# Patient Record
Sex: Female | Born: 1995 | Race: White | Hispanic: No | Marital: Married | Smoking: Never smoker
Health system: Southern US, Community
[De-identification: ages and names within clinical notes are randomized; demographics above are authoritative.]

## PROBLEM LIST (undated history)

## (undated) DIAGNOSIS — C801 Malignant (primary) neoplasm, unspecified: Secondary | ICD-10-CM

## (undated) DIAGNOSIS — M8580 Other specified disorders of bone density and structure, unspecified site: Secondary | ICD-10-CM

## (undated) HISTORY — DX: Other specified disorders of bone density and structure, unspecified site: M85.80

---

## 2020-09-03 ENCOUNTER — Other Ambulatory Visit: Payer: Self-pay | Admitting: Radiology

## 2020-09-03 DIAGNOSIS — N6320 Unspecified lump in the left breast, unspecified quadrant: Secondary | ICD-10-CM

## 2020-09-14 ENCOUNTER — Other Ambulatory Visit: Payer: Self-pay | Admitting: Radiology

## 2020-09-14 ENCOUNTER — Ambulatory Visit
Admission: RE | Admit: 2020-09-14 | Discharge: 2020-09-14 | Disposition: A | Discharge status: Home / Self Care | Payer: No Typology Code available for payment source | Source: Ambulatory Visit | Attending: Radiology | Admitting: Radiology

## 2020-09-14 ENCOUNTER — Other Ambulatory Visit: Payer: Self-pay

## 2020-09-14 DIAGNOSIS — N6320 Unspecified lump in the left breast, unspecified quadrant: Secondary | ICD-10-CM

## 2020-09-18 ENCOUNTER — Other Ambulatory Visit: Payer: Self-pay

## 2020-09-18 ENCOUNTER — Ambulatory Visit
Admission: RE | Admit: 2020-09-18 | Discharge: 2020-09-18 | Disposition: A | Discharge status: Home / Self Care | Payer: No Typology Code available for payment source | Source: Ambulatory Visit | Attending: Radiology | Admitting: Radiology

## 2020-09-18 DIAGNOSIS — N6320 Unspecified lump in the left breast, unspecified quadrant: Secondary | ICD-10-CM

## 2020-09-22 ENCOUNTER — Other Ambulatory Visit: Payer: No Typology Code available for payment source

## 2020-09-24 ENCOUNTER — Other Ambulatory Visit: Payer: Self-pay | Admitting: Hematology and Oncology

## 2020-09-24 DIAGNOSIS — C50911 Malignant neoplasm of unspecified site of right female breast: Secondary | ICD-10-CM

## 2020-09-24 NOTE — Progress Notes (Signed)
[facility] CONSULT NOTE  Patient Care Team: Chrzanowski, Annitta Needs, NP as PCP - General (Nurse Practitioner)  CHIEF COMPLAINTS/PURPOSE OF CONSULTATION:  Newly diagnosed left breast cancer  HISTORY OF PRESENTING ILLNESS:  Allison Dodson 25 y.o. female is here because of recent diagnosis of invasive ductal carcinoma of the left breast. She palpated a mass in the left breast, and she has extensive family history of breast cancer with numerous cousins on her mother's side, maternal grandmother (mid 18's) and her maternal great aunt, a maternal first cousin (late 1's), and four daughters of her maternal great aunt were all diagnosed with breast cancer. Diagnostic mammogram and [place] on 09/14/2020 showed a suspicious mass in the 3 o'clock location, measuring 3 centimeters and a possible 5 millimeter satellite nodule is identified anterior and inferior to the larger mass. Biopsy on 09/18/2020 showed invasive ductal carcinoma. She presents to the clinic today for initial evaluation and discussion of treatment options.   I reviewed her records extensively and collaborated the history with the patient.  SUMMARY OF ONCOLOGIC HISTORY: Oncology History  Malignant neoplasm of upper-outer quadrant of left breast in female, estrogen receptor positive ([place])  09/25/2020 Initial Diagnosis   Palpable lump in the left breast.  Mammogram revealed 3 cm tumor and a possible 5 mm satellite nodule.  Biopsy revealed grade 3 IDC ER 40% weak, PR 0%, HER2 negative, Ki-67 60%   09/25/2020 Cancer Staging   Staging form: Breast, AJCC 8th Edition - Clinical stage from 09/25/2020: Stage IIB (cT2, cN0, cM0, G3, ER+, PR-, HER2-) - Signed by Nicholas Lose, MD on 09/25/2020 Stage prefix: Initial diagnosis Histologic grading system: 3 grade system     MEDICAL HISTORY:  No previous medical problems  SURGICAL HISTORY: No prior surgeries  SOCIAL HISTORY: Denies any tobacco or alcohol or recreational drug use FAMILY HISTORY:  Extensive family history of breast cancer No family history on file.  ALLERGIES:  has no allergies on file.  MEDICATIONS:  Current Outpatient Medications  Medication Sig Dispense Refill   tamoxifen (NOLVADEX) 20 MG tablet Take 1 tablet (20 mg total) by mouth daily. 30 tablet 1   No current facility-administered medications for this visit.    REVIEW OF SYSTEMS:   Constitutional: Denies fevers, chills or abnormal night sweats Eyes: Denies blurriness of vision, double vision or watery eyes Ears, nose, mouth, throat, and face: Denies mucositis or sore throat Respiratory: Denies cough, dyspnea or wheezes Cardiovascular: Denies palpitation, chest discomfort or lower extremity swelling Gastrointestinal:  Denies nausea, heartburn or change in bowel habits Skin: Denies abnormal skin rashes Lymphatics: Denies new lymphadenopathy or easy bruising Neurological:Denies numbness, tingling or new weaknesses Behavioral/Psych: Mood is stable, no new changes  Breast: Palpable lump in the breast All other systems were reviewed with the patient and are negative.  PHYSICAL EXAMINATION: ECOG PERFORMANCE STATUS: 0 - Asymptomatic  Vitals:   09/25/20 1200  BP: 120/77  Pulse: (!) 101  Resp: 18  Temp: 97.8 F (36.6 C)  SpO2: 100%   Filed Weights   09/25/20 1200  Weight: 222 lb 12.8 oz (101.1 kg)   RADIOGRAPHIC STUDIES: I have personally reviewed the radiological reports and agreed with the findings in the report.  ASSESSMENT AND PLAN:  Malignant neoplasm of upper-outer quadrant of left breast in female, estrogen receptor positive ([place]) 09/22/2020: Palpable mass in the left breast corresponding to suspicious mass on mammogram measuring 3 cm, possible 5 mm satellite nodule, left axilla negative, right breast negative ER 40% weak, PR negative,  HER2 negative, Ki-67 60%  Pathology and radiology counseling:Discussed with the patient, the details of pathology including the type of breast cancer,the  clinical staging, the significance of ER, PR and HER-2/neu receptors and the implications for treatment. After reviewing the pathology in detail, we proceeded to discuss the different treatment options between surgery, radiation, chemotherapy, antiestrogen therapies.  Recommendations: 1. Breast conserving surgery followed by 2. adjuvant chemotherapy with dose dense Adriamycin and Cytoxan followed by Taxol weekly x12 3. Adjuvant radiation therapy followed by 4. Adjuvant antiestrogen therapy with ovarian function suppression and aromatase inhibitor therapy  Chemotherapy Counseling: I discussed the risks and benefits of chemotherapy including the risks of nausea/ vomiting, risk of infection from low WBC count, fatigue due to chemo or anemia, bruising or bleeding due to low platelets, mouth sores, loss/ change in taste and decreased appetite. Liver and kidney function will be monitored through out chemotherapy as abnormalities in liver and kidney function may be a side effect of treatment. Cardiac dysfunction due to Adriamycin Herceptin and Perjeta were discussed in detail. Risk of permanent bone marrow dysfunction and leukemia due to chemo were also discussed.  Logistics: Patient's wedding is on 10/22/2020.  She will be back in town 11/02/2020. Subsequently she will undergo surgery. I started her on tamoxifen today at 10 mg daily and she will stop 4 days before her wedding. We discussed the risks and benefits of tamoxifen and the risk of hot flashes mood swings, muscle cramps and risk of blood clots as well. Instructed her that she cannot get pregnant while on tamoxifen.  Plan: 1.  Genetic testing 2. breast MRI Return to clinic after surgery to discuss final pathology report and come up with the adjuvant chemotherapy plan. I went over the risks and benefits of chemotherapy with her.     All questions were answered. The patient knows to call the clinic with any problems, questions or concerns.    Rulon Eisenmenger, MD, MPH 09/25/2020    I, Thana Ates, am acting as scribe for Nicholas Lose, MD.  I have reviewed the above documentation for accuracy and completeness, and I agree with the above.

## 2020-09-25 ENCOUNTER — Inpatient Hospital Stay: Payer: No Typology Code available for payment source | Attending: Hematology and Oncology

## 2020-09-25 ENCOUNTER — Other Ambulatory Visit: Payer: Self-pay

## 2020-09-25 ENCOUNTER — Other Ambulatory Visit: Payer: Self-pay | Admitting: Genetic Counselor

## 2020-09-25 ENCOUNTER — Inpatient Hospital Stay (HOSPITAL_BASED_OUTPATIENT_CLINIC_OR_DEPARTMENT_OTHER): Payer: No Typology Code available for payment source | Admitting: Hematology and Oncology

## 2020-09-25 ENCOUNTER — Encounter: Payer: Self-pay | Admitting: Genetic Counselor

## 2020-09-25 ENCOUNTER — Inpatient Hospital Stay (HOSPITAL_BASED_OUTPATIENT_CLINIC_OR_DEPARTMENT_OTHER): Payer: No Typology Code available for payment source | Admitting: Genetic Counselor

## 2020-09-25 DIAGNOSIS — Z170 Estrogen receptor positive status [ER+]: Secondary | ICD-10-CM

## 2020-09-25 DIAGNOSIS — C50412 Malignant neoplasm of upper-outer quadrant of left female breast: Secondary | ICD-10-CM | POA: Insufficient documentation

## 2020-09-25 DIAGNOSIS — C50912 Malignant neoplasm of unspecified site of left female breast: Secondary | ICD-10-CM

## 2020-09-25 DIAGNOSIS — Z803 Family history of malignant neoplasm of breast: Secondary | ICD-10-CM | POA: Insufficient documentation

## 2020-09-25 HISTORY — DX: Family history of malignant neoplasm of breast: Z80.3

## 2020-09-25 HISTORY — DX: Malignant neoplasm of unspecified site of left female breast: C50.912

## 2020-09-25 LAB — GENETIC SCREENING ORDER

## 2020-09-25 MED ORDER — TAMOXIFEN CITRATE 20 MG PO TABS: 20.0000 mg | ORAL_TABLET | Freq: Every day | ORAL | 1 refills | Status: DC

## 2020-09-25 NOTE — Progress Notes (Signed)
REFERRING PROVIDER: Nicholas Lose, MD [address] [place],  [place] [address]  PRIMARY PROVIDER:  Kerry Dory, NP  PRIMARY REASON FOR VISIT:  1. Malignant neoplasm of upper-outer quadrant of left breast in female, estrogen receptor positive ([place])   2. Family history of breast cancer      HISTORY OF PRESENT ILLNESS:   [name], a 25 y.o. female, was seen for a  cancer genetics consultation at the request of Dr. Lindi Adie due to a personal history of cancer.  [name] presents to clinic today to discuss the possibility of a hereditary predisposition to cancer, to discuss genetic testing, and to further clarify her future cancer risks, as well as potential cancer risks for family members.   In September 2022, at the age of 20, Allison Dodson was diagnosed with invasive ductal carcinoma of the left breast.  The preliminary treatment plan is pending.    CANCER HISTORY:  Oncology History  Malignant neoplasm of upper-outer quadrant of left breast in female, estrogen receptor positive ([place])  09/25/2020 Initial Diagnosis   Palpable lump in the left breast.  Mammogram revealed 3 cm tumor and a possible 5 mm satellite nodule.  Biopsy revealed grade 3 IDC ER 40% weak, PR 0%, HER2 negative, Ki-67 60%   09/25/2020 Cancer Staging   Staging form: Breast, AJCC 8th Edition - Clinical stage from 09/25/2020: Stage IIB (cT2, cN0, cM0, G3, ER+, PR-, HER2-) - Signed by Nicholas Lose, MD on 09/25/2020 Stage prefix: Initial diagnosis Histologic grading system: 3 grade system      RISK FACTORS:  Menarche was at age 23.  Nulliparous.  OCP use for approximately 8 years.  Ovaries intact: yes.  Hysterectomy: no.  Menopausal status: premenopausal.  HRT use: 0 years. Colonoscopy: yes; not examined. Mammogram within the last year: yes. Number of breast biopsies: 1. Up to date with pelvic exams: most recent PAP at 45. Any excessive radiation exposure in the past: no  Past Medical  History:  Diagnosis Date   Family history of breast cancer 09/25/2020     Social History   Socioeconomic History   Marital status: Married    Spouse name: Not on file   Number of children: Not on file   Years of education: Not on file   Highest education level: Not on file  Occupational History   Not on file  Tobacco Use   Smoking status: Not on file   Smokeless tobacco: Not on file  Substance and Sexual Activity   Alcohol use: Not on file   Drug use: Not on file   Sexual activity: Not on file  Other Topics Concern   Not on file  Social History Narrative   Not on file   Social Determinants of Health   Financial Resource Strain: Not on file  Food Insecurity: Not on file  Transportation Needs: Not on file  Physical Activity: Not on file  Stress: Not on file  Social Connections: Not on file     FAMILY HISTORY:  We obtained a detailed, 4-generation family history.  Significant diagnoses are listed below: Family History  Problem Relation Age of Onset   Liver cancer Maternal Aunt 55   Breast cancer Maternal Aunt 59   Breast cancer Maternal Grandmother 71   Lung cancer Paternal Grandfather 74       smoking hx   Breast cancer Other        MGM's sister & nieces   Breast cancer Cousin 27  triple negative    Allison Dodson is unaware of previous family history of genetic testing for hereditary cancer risks besides that mentioned above. Patient's ancestors are of Irish/Native American descent. There is no reported Ashkenazi Jewish ancestry. There is no known consanguinity.  GENETIC COUNSELING ASSESSMENT: [name] is a 25 y.o. female with a personal and family history of cancer which is somewhat suggestive of a hereditary cancer syndrome and predisposition to cancer given her age of diagnosis and the presence of related cancers in her maternal family. We, therefore, discussed and recommended the following at today's visit.   DISCUSSION: We discussed that 5 - 10% of cancer is  hereditary, with most cases of hereditary breast cancer associated with mutations in BRCA1/2.  There are other genes that can be associated with hereditary breast cancer syndromes.  Type of cancer risk and level of risk are gene-specific. We discussed that testing is beneficial for several reasons including knowing how to follow individuals after completing their treatment, identifying whether potential treatment options would be beneficial, and understanding if other family members could be at risk for cancer and allowing them to undergo genetic testing.   We reviewed the characteristics, features and inheritance patterns of hereditary cancer syndromes. We also discussed genetic testing, including the appropriate family members to test, the process of testing, insurance coverage and turn-around-time for results. We discussed the implications of a negative, positive and/or variant of uncertain significant result. In order to get genetic test results in a timely manner so that Allison Dodson can use these genetic test results for surgical decisions, we recommended Allison Dodson pursue genetic testing for the [facility] BRCAPlus Panel.  The BRCAplus panel offered by [employer] and includes sequencing and deletion/duplication analysis for the following 8 genes: ATM, BRCA1, BRCA2, CDH1, CHEK2, PALB2, PTEN, and TP53.  Once complete, we recommend Allison Dodson pursue reflex genetic testing to a more comprehensive gene panel.   [name]  was offered a common hereditary cancer panel (47 genes) and an expanded pan-cancer panel (77 genes). [name] was informed of the benefits and limitations of each panel, including that expanded pan-cancer panels contain genes that do not have clear management guidelines at this point in time.  We also discussed that as the number of genes included on a panel increases, the chances of variants of uncertain significance increases.  After considering the benefits and limitations of each gene panel, [name]  elected to have an expanded pan-cancer panel through [place].  The CancerNext-Expanded gene panel offered by [facility] and includes sequencing, rearrangement, and RNA analysis for the following 77 genes: AIP, ALK, APC, ATM, AXIN2, BAP1, BARD1, BLM, BMPR1A, BRCA1, BRCA2, BRIP1, CDC73, CDH1, CDK4, CDKN1B, CDKN2A, CHEK2, CTNNA1, DICER1, FANCC, FH, FLCN, GALNT12, KIF1B, LZTR1, MAX, MEN1, MET, MLH1, MSH2, MSH3, MSH6, MUTYH, NBN, NF1, NF2, NTHL1, PALB2, PHOX2B, PMS2, POT1, PRKAR1A, PTCH1, PTEN, RAD51C, RAD51D, RB1, RECQL, RET, SDHA, SDHAF2, SDHB, SDHC, SDHD, SMAD4, SMARCA4, SMARCB1, SMARCE1, STK11, SUFU, TMEM127, TP53, TSC1, TSC2, VHL and XRCC2 (sequencing and deletion/duplication); EGFR, EGLN1, HOXB13, KIT, MITF, PDGFRA, POLD1, and POLE (sequencing only); EPCAM and GREM1 (deletion/duplication only).   Based on Allison Dodson's personal history of cancer, she meets medical criteria for genetic testing. Despite that she meets criteria, she may still have an out of pocket cost. We discussed that if her out of pocket cost for testing is over $100, the laboratory should contact her to discuss self-pay prices, patient pay assistance programs, if applicable, and other billing options.  PLAN: After considering the risks, benefits, and limitations, Allison Dodson provided informed consent to pursue genetic testing and the blood sample was sent to [facility] for analysis of the CancerNext-Expanded . Results should be available within approximately 1-2 weeks' time, at which point they will be disclosed by telephone to Allison Dodson, as will any additional recommendations warranted by these results. [name] will receive a summary of her genetic counseling visit and a copy of her results once available. This information will also be available in Epic.   Based on Allison Dodson's family history, we recommended her maternal cousin, who was diagnosed with triple negative breast cancer at age 16, have genetic counseling and testing.  [name] will let us know if we can be of any assistance in coordinating genetic counseling and/or testing for this family member.   Lastly, we encouraged Allison Dodson to remain in contact with cancer genetics annually so that we can continuously update the family history and inform her of any changes in cancer genetics and testing that may be of benefit for this family.   Ms. Guido Sander questions were answered to her satisfaction today. Our contact information was provided should additional questions or concerns arise. Thank you for the referral and allowing [place] to share in the care of your patient.   Hiram Mciver M. Joette Catching, [place], [facility] Genetic Counselor [email or web address] (P) [phone]  The patient was seen for a total of 40 minutes in face-to-face genetic counseling.  The patient was accompanied by her fiance, Allison Dodson. Drs. Magrinat, Lindi Adie and/or Burr Medico were available to discuss this case as needed.  _______________________________________________________________________ For Office Staff:  Number of people involved in session: 2 Was an Intern/ student involved with case: no

## 2020-09-25 NOTE — Assessment & Plan Note (Addendum)
09/22/2020: Palpable mass in the left breast corresponding to suspicious mass on mammogram measuring 3 cm, possible 5 mm satellite nodule, left axilla negative, right breast negative ER 40% weak, PR negative, HER2 negative, Ki-67 60%  Pathology and radiology counseling:Discussed with the patient, the details of pathology including the type of breast cancer,the clinical staging, the significance of ER, PR and HER-2/neu receptors and the implications for treatment. After reviewing the pathology in detail, we proceeded to discuss the different treatment options between surgery, radiation, chemotherapy, antiestrogen therapies.  Recommendations: 1. Breast conserving surgery followed by 2. adjuvant chemotherapy with dose dense Adriamycin and Cytoxan followed by Taxol weekly x12 3. Adjuvant radiation therapy followed by 4. Adjuvant antiestrogen therapy with ovarian function suppression and aromatase inhibitor therapy  Chemotherapy Counseling: I discussed the risks and benefits of chemotherapy including the risks of nausea/ vomiting, risk of infection from low WBC count, fatigue due to chemo or anemia, bruising or bleeding due to low platelets, mouth sores, loss/ change in taste and decreased appetite. Liver and kidney function will be monitored through out chemotherapy as abnormalities in liver and kidney function may be a side effect of treatment. Cardiac dysfunction due to Adriamycin Herceptin and Perjeta were discussed in detail. Risk of permanent bone marrow dysfunction and leukemia due to chemo were also discussed.  Logistics: Patient's wedding is on 10/22/2020.  She will be back in town 11/02/2020. Subsequently she will undergo surgery. I started her on tamoxifen today at 10 mg daily and she will stop 4 days before her wedding. We discussed the risks and benefits of tamoxifen and the risk of hot flashes mood swings, muscle cramps and risk of blood clots as well. Instructed her that she cannot get  pregnant while on tamoxifen.  Plan: 1.  Genetic testing 2. breast MRI Return to clinic after surgery to discuss final pathology report and come up with the adjuvant chemotherapy plan. I went over the risks and benefits of chemotherapy with her.

## 2020-09-27 ENCOUNTER — Other Ambulatory Visit: Payer: Self-pay

## 2020-09-27 ENCOUNTER — Other Ambulatory Visit: Payer: Self-pay | Admitting: Hematology and Oncology

## 2020-09-27 ENCOUNTER — Ambulatory Visit
Admission: RE | Admit: 2020-09-27 | Discharge: 2020-09-27 | Disposition: A | Discharge status: Home / Self Care | Payer: No Typology Code available for payment source | Source: Ambulatory Visit | Attending: Hematology and Oncology | Admitting: Hematology and Oncology

## 2020-09-27 DIAGNOSIS — C50912 Malignant neoplasm of unspecified site of left female breast: Secondary | ICD-10-CM

## 2020-09-27 DIAGNOSIS — C50911 Malignant neoplasm of unspecified site of right female breast: Secondary | ICD-10-CM

## 2020-09-27 MED ORDER — GADOBUTROL 1 MMOL/ML IV SOLN
10.0000 mL | Freq: Once | INTRAVENOUS | Status: AC | PRN
  Administered 2020-09-27: 10 mL via INTRAVENOUS

## 2020-09-28 ENCOUNTER — Telehealth: Payer: Self-pay | Admitting: *Deleted

## 2020-09-28 ENCOUNTER — Encounter: Payer: Self-pay | Admitting: *Deleted

## 2020-09-28 ENCOUNTER — Telehealth: Payer: Self-pay | Admitting: Hematology and Oncology

## 2020-09-28 ENCOUNTER — Other Ambulatory Visit: Payer: Self-pay | Admitting: Hematology and Oncology

## 2020-09-28 DIAGNOSIS — C50412 Malignant neoplasm of upper-outer quadrant of left female breast: Secondary | ICD-10-CM

## 2020-09-28 DIAGNOSIS — Z170 Estrogen receptor positive status [ER+]: Secondary | ICD-10-CM

## 2020-09-28 MED ORDER — DEXAMETHASONE 4 MG PO TABS: 4.0000 mg | ORAL_TABLET | Freq: Every day | ORAL | 1 refills | Status: DC

## 2020-09-28 MED ORDER — ONDANSETRON HCL 8 MG PO TABS: 8.0000 mg | ORAL_TABLET | Freq: Two times a day (BID) | ORAL | 1 refills | Status: DC | PRN

## 2020-09-28 MED ORDER — LORAZEPAM 0.5 MG PO TABS: 0.5000 mg | ORAL_TABLET | Freq: Every evening | ORAL | 0 refills | Status: DC | PRN

## 2020-09-28 MED ORDER — LIDOCAINE-PRILOCAINE 2.5-2.5 % EX CREA: TOPICAL_CREAM | CUTANEOUS | 3 refills | Status: DC

## 2020-09-28 MED ORDER — PROCHLORPERAZINE MALEATE 10 MG PO TABS: 10.0000 mg | ORAL_TABLET | Freq: Four times a day (QID) | ORAL | 1 refills | Status: DC | PRN

## 2020-09-28 NOTE — Progress Notes (Signed)
I reviewed the breast MRI with the patient. The tumor measures 3.7 cm and there is a 7 mm additional satellite nodule. There is no lymphadenopathy. Based on these findings I recommended starting systemic chemotherapy after her wedding and honeymoon.  This will start on the week of October 21.

## 2020-09-28 NOTE — Telephone Encounter (Signed)
Spoke with patient and she will do neoadjuvant chemo after she returns from her wedding/honeymoon.  Confirmed appts for chemo education 9/27 at 8am followed by echo at 10 at [facility].  I have reached out the research nurses to discuss nausea study with her on 9/27.

## 2020-09-28 NOTE — Telephone Encounter (Signed)
Spoke with patient to follow up from new patient appt. And assess navigation needs.  Contact information.  I informed her I had spoken with [facility] about an appointment.  She informed me that they had contacted her this am with information and she is not sure yet as to what she wants to do regarding fertility preservation.  Reviewed plan and there seems to be some confusion regarding the sequence of treatment plan. Dr. Lindi Adie will be contacting her to verify treatment plan.  Encouraged her to call with any questions or concerns. Patient verbalized understanding.

## 2020-09-28 NOTE — Progress Notes (Signed)
START ON PATHWAY REGIMEN - Breast     Cycles 1 through 4: A cycle is every 14 days:     Doxorubicin      Cyclophosphamide      [id number]    Cycles 5 through 16: A cycle is every 7 days:     Paclitaxel   **Always confirm dose/schedule in your pharmacy ordering system**  Patient Characteristics: Preoperative or Nonsurgical Candidate (Clinical Staging), Neoadjuvant Therapy followed by Surgery, Invasive Disease, Chemotherapy, HER2 Negative/Unknown/Equivocal, ER Positive Therapeutic Status: Preoperative or Nonsurgical Candidate (Clinical Staging) AJCC M Category: cM0 AJCC Grade: G3 Breast Surgical Plan: Neoadjuvant Therapy followed by Surgery ER Status: Positive (+) AJCC 8 Stage Grouping: IIB HER2 Status: Negative (-) AJCC T Category: cT2 AJCC N Category: cN0 PR Status: Negative (-) Intent of Therapy: Curative Intent, Discussed with Patient

## 2020-09-28 NOTE — Telephone Encounter (Signed)
Spoke to patient to confirm upcoming ECHO appointment gave time and location, patient voiced understanding

## 2020-10-01 ENCOUNTER — Other Ambulatory Visit: Payer: Self-pay | Admitting: *Deleted

## 2020-10-01 DIAGNOSIS — C50412 Malignant neoplasm of upper-outer quadrant of left female breast: Secondary | ICD-10-CM

## 2020-10-01 DIAGNOSIS — Z170 Estrogen receptor positive status [ER+]: Secondary | ICD-10-CM

## 2020-10-02 ENCOUNTER — Encounter: Payer: Self-pay | Admitting: *Deleted

## 2020-10-02 ENCOUNTER — Telehealth: Payer: Self-pay | Admitting: Hematology and Oncology

## 2020-10-02 NOTE — Telephone Encounter (Signed)
Sch per 9/16 basket , pt aware

## 2020-10-05 ENCOUNTER — Telehealth: Payer: Self-pay | Admitting: Genetic Counselor

## 2020-10-05 ENCOUNTER — Encounter: Payer: Self-pay | Admitting: *Deleted

## 2020-10-05 ENCOUNTER — Encounter: Payer: Self-pay | Admitting: Genetic Counselor

## 2020-10-05 DIAGNOSIS — Z1379 Encounter for other screening for genetic and chromosomal anomalies: Secondary | ICD-10-CM | POA: Insufficient documentation

## 2020-10-05 NOTE — Telephone Encounter (Signed)
Revealed negative genetic testing.  Discussed that we do not know why she has breast cancer or why there is cancer in the family. It could be familial, due to a different gene that we are not testing, or maybe our current technology may not be able to pick something up.  It will be important for her to keep in contact with genetics to keep up with whether additional testing may be needed.  Results of pan-cancer panel pending.

## 2020-10-09 NOTE — Patient Instructions (Signed)
DUE TO COVID-19 ONLY ONE VISITOR IS ALLOWED TO COME WITH YOU AND STAY IN THE WAITING ROOM ONLY DURING PRE OP AND PROCEDURE.   **NO VISITORS ARE ALLOWED IN THE SHORT STAY AREA OR RECOVERY ROOM!!**  IF YOU WILL BE ADMITTED INTO THE HOSPITAL YOU ARE ALLOWED ONLY TWO SUPPORT PEOPLE DURING VISITATION HOURS ONLY (10AM -8PM)   The support person(s) may change daily. The support person(s) must pass our screening, gel in and out, and wear a mask at all times, including in the patient's room. Patients must also wear a mask when staff or their support person are in the room.  No visitors under the age of 41. Any visitor under the age of 41 must be accompanied by an adult.     Your procedure is scheduled on: 11/02/20   Report to [facility] Main Entrance   Report to Short Stay at 5:15 AM   [facility])   Call this number if you have problems the morning of surgery [phone]   Do not eat food :After Midnight.   May have liquids until : 4:30 AM   day of surgery  CLEAR LIQUID DIET  Foods Allowed                                                                     Foods Excluded  Water, Black Coffee and tea, regular and decaf                             liquids that you cannot  Plain Jell-O in any flavor  (No red)                                           see through such as: Fruit ices (not with fruit pulp)                                     milk, soups, orange juice              Iced Popsicles (No red)                                    All solid food                                   Apple juices Sports drinks like Gatorade (No red) Lightly seasoned clear broth or consume(fat free) Sugar,   Sample Menu Breakfast                                Lunch                                     Supper Cranberry juice  Beef broth                            Chicken broth Jell-O                                     Grape juice                           Apple juice Coffee  or tea                        Jell-O                                      Popsicle                                                Coffee or tea                        Coffee or te       Oral Hygiene is also important to reduce your risk of infection.                                    Remember - BRUSH YOUR TEETH THE MORNING OF SURGERY WITH YOUR REGULAR TOOTHPASTE   Do NOT smoke after Midnight   Take these medicines the morning of surgery with A SIP OF WATER:   DO NOT TAKE ANY ORAL DIABETIC MEDICATIONS DAY OF YOUR SURGERY                              You may not have any metal on your body including hair pins, jewelry, and body piercing             Do not wear make-up, lotions, powders, perfumes/cologne, or deodorant  Do not wear nail polish including gel and S&S, artificial/acrylic nails, or any other type of covering on natural nails including finger and toenails. If you have artificial nails, gel coating, etc. that needs to be removed by a nail salon please have this removed prior to surgery or surgery may need to be canceled/ delayed if the surgeon/ anesthesia feels like they are unable to be safely monitored.   Do not shave  48 hours prior to surgery.    Do not bring valuables to the hospital. [place].   Contacts, dentures or bridgework may not be worn into surgery.   Bring small overnight bag day of surgery.    Patients discharged on the day of surgery will not be allowed to drive home.   Special Instructions: Bring a copy of your healthcare power of attorney and living will documents         the day of surgery if you haven't scanned them before.  Please read over the following fact sheets you were given: IF YOU HAVE QUESTIONS ABOUT YOUR PRE-OP INSTRUCTIONS PLEASE CALL [phone]   [facility] - Preparing for Surgery Before surgery, you can play an important role.  Because skin is not sterile, your skin needs to  be as free of germs as possible.  You can reduce the number of germs on your skin by washing with CHG (chlorahexidine gluconate) soap before surgery.  CHG is an antiseptic cleaner which kills germs and bonds with the skin to continue killing germs even after washing. Please DO NOT use if you have an allergy to CHG or antibacterial soaps.  If your skin becomes reddened/irritated stop using the CHG and inform your nurse when you arrive at Short Stay. Do not shave (including legs and underarms) for at least 48 hours prior to the first CHG shower.  You may shave your face/neck. Please follow these instructions carefully:  1.  Shower with CHG Soap the night before surgery and the  morning of Surgery.  2.  If you choose to wash your hair, wash your hair first as usual with your  normal  shampoo.  3.  After you shampoo, rinse your hair and body thoroughly to remove the  shampoo.                           4.  Use CHG as you would any other liquid soap.  You can apply chg directly  to the skin and wash                       Gently with a scrungie or clean washcloth.  5.  Apply the CHG Soap to your body ONLY FROM THE NECK DOWN.   Do not use on face/ open                           Wound or open sores. Avoid contact with eyes, ears mouth and genitals (private parts).                       Wash face,  Genitals (private parts) with your normal soap.             6.  Wash thoroughly, paying special attention to the area where your surgery  will be performed.  7.  Thoroughly rinse your body with warm water from the neck down.  8.  DO NOT shower/wash with your normal soap after using and rinsing off  the CHG Soap.                9.  Pat yourself dry with a clean towel.            10.  Wear clean pajamas.            11.  Place clean sheets on your bed the night of your first shower and do not  sleep with pets. Day of Surgery : Do not apply any lotions/deodorants the morning of surgery.  Please wear clean clothes to  the hospital/surgery center.  FAILURE TO FOLLOW THESE INSTRUCTIONS MAY RESULT IN THE CANCELLATION OF YOUR SURGERY PATIENT SIGNATURE_________________________________  NURSE SIGNATURE__________________________________  ________________________________________________________________________

## 2020-10-09 NOTE — Progress Notes (Signed)
Pt. Needs orders for surgery.PAT and labs on 10/13/20.

## 2020-10-12 ENCOUNTER — Other Ambulatory Visit: Payer: Self-pay | Admitting: General Surgery

## 2020-10-12 ENCOUNTER — Other Ambulatory Visit (HOSPITAL_COMMUNITY): Payer: No Typology Code available for payment source

## 2020-10-13 ENCOUNTER — Encounter (HOSPITAL_COMMUNITY): Payer: Self-pay

## 2020-10-13 ENCOUNTER — Encounter (HOSPITAL_COMMUNITY)
Admission: RE | Admit: 2020-10-13 | Discharge: 2020-10-13 | Disposition: A | Discharge status: Home / Self Care | Payer: No Typology Code available for payment source | Source: Ambulatory Visit | Attending: Hematology and Oncology | Admitting: Hematology and Oncology

## 2020-10-13 ENCOUNTER — Ambulatory Visit (HOSPITAL_COMMUNITY)
Admission: RE | Admit: 2020-10-13 | Discharge: 2020-10-13 | Disposition: A | Discharge status: Home / Self Care | Payer: No Typology Code available for payment source | Source: Ambulatory Visit | Attending: Hematology and Oncology | Admitting: Hematology and Oncology

## 2020-10-13 ENCOUNTER — Inpatient Hospital Stay: Payer: No Typology Code available for payment source

## 2020-10-13 ENCOUNTER — Telehealth: Payer: Self-pay | Admitting: Genetic Counselor

## 2020-10-13 ENCOUNTER — Other Ambulatory Visit: Payer: Self-pay

## 2020-10-13 ENCOUNTER — Ambulatory Visit: Payer: Self-pay | Admitting: Genetic Counselor

## 2020-10-13 DIAGNOSIS — C50412 Malignant neoplasm of upper-outer quadrant of left female breast: Secondary | ICD-10-CM | POA: Insufficient documentation

## 2020-10-13 DIAGNOSIS — Z01818 Encounter for other preprocedural examination: Secondary | ICD-10-CM | POA: Insufficient documentation

## 2020-10-13 DIAGNOSIS — Z1379 Encounter for other screening for genetic and chromosomal anomalies: Secondary | ICD-10-CM

## 2020-10-13 DIAGNOSIS — Z170 Estrogen receptor positive status [ER+]: Secondary | ICD-10-CM

## 2020-10-13 DIAGNOSIS — Z0189 Encounter for other specified special examinations: Secondary | ICD-10-CM

## 2020-10-13 DIAGNOSIS — Z803 Family history of malignant neoplasm of breast: Secondary | ICD-10-CM

## 2020-10-13 HISTORY — DX: Malignant (primary) neoplasm, unspecified: C80.1

## 2020-10-13 LAB — CBC
HCT: 37.9 % (ref 36.0–46.0)
Hemoglobin: 12.6 g/dL (ref 12.0–15.0)
MCH: 28.5 pg (ref 26.0–34.0)
MCHC: 33.2 g/dL (ref 30.0–36.0)
MCV: 85.7 fL (ref 80.0–100.0)
Platelets: 279 10*3/uL (ref 150–400)
RBC: 4.42 MIL/uL (ref 3.87–5.11)
RDW: 12.9 % (ref 11.5–15.5)
WBC: 8.6 10*3/uL (ref 4.0–10.5)
nRBC: 0 % (ref 0.0–0.2)

## 2020-10-13 LAB — ECHOCARDIOGRAM COMPLETE
Area-P 1/2: 5.66 cm2
S' Lateral: 3 cm

## 2020-10-13 NOTE — Progress Notes (Signed)
  Echocardiogram 2D Echocardiogram has been performed.  Darlina Sicilian M 10/13/2020, 10:50 AM

## 2020-10-13 NOTE — Progress Notes (Signed)
HPI:  Allison Dodson was previously seen in the [facility] clinic due to a personal and family history of cancer and concerns regarding a hereditary predisposition to cancer. Please refer to our prior cancer genetics clinic note for more information regarding our discussion, assessment and recommendations, at the time. Ms. Guido Sander recent genetic test results were disclosed to her, as were recommendations warranted by these results. These results and recommendations are discussed in more detail below.  CANCER HISTORY:  Oncology History  Malignant neoplasm of upper-outer quadrant of left breast in female, estrogen receptor positive ([place])  09/25/2020 Initial Diagnosis   Palpable lump in the left breast.  Mammogram revealed 3 cm tumor and a possible 5 mm satellite nodule.  Biopsy revealed grade 3 IDC ER 40% weak, PR 0%, HER2 negative, Ki-67 60%   09/25/2020 Cancer Staging   Staging form: Breast, AJCC 8th Edition - Clinical stage from 09/25/2020: Stage IIB (cT2, cN0, cM0, G3, ER+, PR-, HER2-) - Signed by Nicholas Lose, MD on 09/25/2020 Stage prefix: Initial diagnosis Histologic grading system: 3 grade system   10/02/2020 Genetic Testing   Negative hereditary cancer genetic testing: no pathogenic variants detected in [facility] BRCAPlus Panel and [facility] CancerNext-Expanded +RNAinsight Panel.  The report dates are October 02, 2020 and October 13, 2020, respectively.   The BRCAplus panel offered by [employer] and includes sequencing and deletion/duplication analysis for the following 8 genes: ATM, BRCA1, BRCA2, CDH1, CHEK2, PALB2, PTEN, and TP53.  The CancerNext-Expanded gene panel offered by [facility] and includes sequencing, rearrangement, and RNA analysis for the following 77 genes: AIP, ALK, APC, ATM, AXIN2, BAP1, BARD1, BLM, BMPR1A, BRCA1, BRCA2, BRIP1, CDC73, CDH1, CDK4, CDKN1B, CDKN2A, CHEK2, CTNNA1, DICER1, FANCC, FH, FLCN, GALNT12, KIF1B, LZTR1, MAX, MEN1, MET, MLH1, MSH2, MSH3, MSH6,  MUTYH, NBN, NF1, NF2, NTHL1, PALB2, PHOX2B, PMS2, POT1, PRKAR1A, PTCH1, PTEN, RAD51C, RAD51D, RB1, RECQL, RET, SDHA, SDHAF2, SDHB, SDHC, SDHD, SMAD4, SMARCA4, SMARCB1, SMARCE1, STK11, SUFU, TMEM127, TP53, TSC1, TSC2, VHL and XRCC2 (sequencing and deletion/duplication); EGFR, EGLN1, HOXB13, KIT, MITF, PDGFRA, POLD1, and POLE (sequencing only); EPCAM and GREM1 (deletion/duplication only).    11/10/2020 -  Chemotherapy    Patient is on Treatment Plan: BREAST ADJUVANT DOSE DENSE AC Q14D / PACLITAXEL Q7D         FAMILY HISTORY:  We obtained a detailed, 4-generation family history.  Significant diagnoses are listed below: Family History  Problem Relation Age of Onset   Liver cancer Maternal Aunt 55   Breast cancer Maternal Aunt 59   Breast cancer Maternal Grandmother 41   Lung cancer Paternal Grandfather 55       smoking hx   Breast cancer Other        MGM's sister & nieces   Breast cancer Cousin 51       triple negative    [name] is unaware of previous family history of genetic testing for hereditary cancer risks besides that mentioned above. Patient's ancestors are of Irish/Native American descent. There is no reported Ashkenazi Jewish ancestry. There is no known consanguinity.    GENETIC TEST RESULTS: Genetic testing reported out on October 13, 2020. The CancerNext-Expanded +RNAinsight Panel through [facility] found no pathogenic mutations. The CancerNext-Expanded gene panel offered by [facility] and includes sequencing, rearrangement, and RNA analysis for the following 77 genes: AIP, ALK, APC, ATM, AXIN2, BAP1, BARD1, BLM, BMPR1A, BRCA1, BRCA2, BRIP1, CDC73, CDH1, CDK4, CDKN1B, CDKN2A, CHEK2, CTNNA1, DICER1, FANCC, FH, FLCN, GALNT12, KIF1B, LZTR1, MAX, MEN1, MET, MLH1, MSH2,  MSH3, MSH6, MUTYH, NBN, NF1, NF2, NTHL1, PALB2, PHOX2B, PMS2, POT1, PRKAR1A, PTCH1, PTEN, RAD51C, RAD51D, RB1, RECQL, RET, SDHA, SDHAF2, SDHB, SDHC, SDHD, SMAD4, SMARCA4, SMARCB1, SMARCE1, STK11, SUFU,  TMEM127, TP53, TSC1, TSC2, VHL and XRCC2 (sequencing and deletion/duplication); EGFR, EGLN1, HOXB13, KIT, MITF, PDGFRA, POLD1, and POLE (sequencing only); EPCAM and GREM1 (deletion/duplication only).   The test report has been scanned into EPIC and is located under the Molecular Pathology section of the Results Review tab.  A portion of the result report is included below for reference.     We discussed with [name] that because current genetic testing is not perfect, it is possible there may be a gene mutation in one of these genes that current testing cannot detect, but that chance is small.  We also discussed, that there could be another gene that has not yet been discovered, or that we have not yet tested, that is responsible for the cancer diagnoses in the family. It is also possible there is a hereditary cause for the cancer in the family that [name] did not inherit and therefore was not identified in her testing.  Therefore, it is important to remain in touch with cancer genetics in the future so that we can continue to offer [name] the most up to date genetic testing.   ADDITIONAL GENETIC TESTING:  We discussed with [name] that her genetic testing was fairly extensive.  If there are genes identified to increase cancer risk that can be analyzed in the future, we would be happy to discuss and coordinate this testing at that time.    CANCER SCREENING RECOMMENDATIONS: Ms. Guido Sander test result is considered negative (normal).  This means that we have not identified a hereditary cause for her personal history of cancer at this time.   Given [name]'s personal and family histories, we must interpret these negative results with some caution.  Families with features suggestive of hereditary risk for cancer tend to have multiple family members with cancer, diagnoses in multiple generations and diagnoses before the age of 23. Ms. Guido Sander family exhibits some of these features. Thus, this result may  simply reflect our current inability to detect all mutations within these genes or there may be a different gene that has not yet been discovered or tested.   An individual's cancer risk and medical management are not determined by genetic test results alone. Overall cancer risk assessment incorporates additional factors, including personal medical history, family history, and any available genetic information that may result in a personalized plan for cancer prevention and surveillance  RECOMMENDATIONS FOR FAMILY MEMBERS:  Individuals in this family might be at some increased risk of developing cancer, over the general population risk, simply due to the family history of cancer.  We recommended women in this family have a yearly mammogram beginning at age 23, or 19 years younger than the earliest onset of cancer, an annual clinical breast exam, and perform monthly breast self-exams. Women in this family should also have a gynecological exam as recommended by their primary provider. Family members should be referred for colonoscopy starting at age 60, or earlier, as recommended by their providers.    It is also possible there is a hereditary cause for the cancer in [name]'s family that she did not inherit and therefore was not identified in her.  Based on [name]'s family history, we recommended her maternal cousin, who was diagnosed with triple negative breast cancer at age 32, have genetic counseling  and testing. Allison Dodson can let us know if we can be of any assistance in coordinating genetic counseling and/or testing for this family member.   FOLLOW-UP: Lastly, we discussed with [name] that cancer genetics is a rapidly advancing field and it is possible that new genetic tests will be appropriate for her and/or her family members in the future. We encouraged her to remain in contact with cancer genetics on an annual basis so we can update her personal and family histories and let her know of advances  in cancer genetics that may benefit this family.   Our contact number was provided. Ms. Guido Sander questions were answered to her satisfaction, and she knows she is welcome to call us at anytime with additional questions or concerns.     Nikesh Teschner M. Joette Catching, [place], [facility], [facility] Genetic Counselor [email or web address] (P) [phone]

## 2020-10-13 NOTE — Telephone Encounter (Addendum)
Revealed negative genetic testing for pan-cancer panel.  Discussed that we do not know why she has breast cancer or why there is cancer in the family. It could be familial, due to a different gene that we are not testing, or maybe our current technology may not be able to pick something up.  It will be important for her to keep in contact with genetics to keep up with whether additional testing may be needed.

## 2020-10-13 NOTE — Progress Notes (Signed)
COVID Vaccine Completed: NO Date COVID Vaccine completed: COVID vaccine manufacturer: [employer] Test: N/A  PCP - Rubbie Battiest: NP Cardiologist -   Chest x-ray -  EKG -  Stress Test -  ECHO -  Cardiac Cath -  Pacemaker/ICD device last checked:  Sleep Study -  CPAP -   Fasting Blood Sugar -  Checks Blood Sugar _____ times a day  Blood Thinner Instructions: Aspirin Instructions: Last Dose:  Anesthesia review:   Patient denies shortness of breath, fever, cough and chest pain at PAT appointment   Patient verbalized understanding of instructions that were given to them at the PAT appointment. Patient was also instructed that they will need to review over the PAT instructions again at home before surgery.

## 2020-10-16 ENCOUNTER — Telehealth: Payer: Self-pay | Admitting: *Deleted

## 2020-10-16 NOTE — Telephone Encounter (Signed)
[id number] - TREATMENT OF REFRACTORY NAUSEA  Introduced study to patient and presented the timeline for completing consent and baseline activities if patient is interested. Patient stated she was interested in the study but unfortunately is too busy before she starts treatment to participate. Patient is going out of town next week for her wedding and then will be on her honeymoon. She returns the day before her PAC placement which is in the morning the day before she starts treatment.   Thanked patient for her time and discussing this study with research nurse.  Presented the DCP-001 data collection study briefly informing patient it could be done while she is in infusion room getting treatment. Patient agreed to discuss possible participation in the DCP-001 study during her treatment.  Thanked patient again and encouraged her to call if any questions prior to her next visit.  Dr. Lindi Adie notified patient declined the [facility] study due to not enough time.  Foye Spurling, BSN, RN Clinical Research Nurse 10/16/2020 1:34 PM

## 2020-10-21 ENCOUNTER — Other Ambulatory Visit: Payer: Self-pay

## 2020-10-21 ENCOUNTER — Ambulatory Visit: Payer: No Typology Code available for payment source | Attending: Hematology and Oncology | Admitting: Physical Therapy

## 2020-10-21 ENCOUNTER — Encounter: Payer: Self-pay | Admitting: Physical Therapy

## 2020-10-21 DIAGNOSIS — Z170 Estrogen receptor positive status [ER+]: Secondary | ICD-10-CM | POA: Insufficient documentation

## 2020-10-21 DIAGNOSIS — R293 Abnormal posture: Secondary | ICD-10-CM | POA: Diagnosis not present

## 2020-10-21 DIAGNOSIS — C50412 Malignant neoplasm of upper-outer quadrant of left female breast: Secondary | ICD-10-CM | POA: Insufficient documentation

## 2020-10-21 NOTE — Therapy (Signed)
[facility] @ [name], [place], [address] Phone:     Fax:     Physical Therapy Evaluation  Patient Details  Name: Allison Dodson MRN: [id number] Date of Birth: 1995/02/07 Referring Provider (PT): Lindi Adie   Encounter Date: 10/21/2020   PT End of Session - 10/21/20 1458     Visit Number 1    Number of Visits 2    Date for PT Re-Evaluation 04/21/21    PT Start Time 1409    PT Stop Time 1455    PT Time Calculation (min) 46 min    Activity Tolerance Patient tolerated treatment well    Behavior During Therapy [facility] for tasks assessed/performed             Past Medical History:  Diagnosis Date   Cancer [facility])    Family history of breast cancer 09/25/2020    History reviewed. No pertinent surgical history.  There were no vitals filed for this visit.    Subjective Assessment - 10/21/20 1410     Subjective I have like 20 weeks of chemo that I am going to do before surgery. I am going to opt for a double mastectomy.    Patient is accompained by: Family member    Pertinent History L breast cancer ER+ PR- Her2-; will complete neoadjuvant chemo then pt plans on having a double mastectomy,    Patient Stated Goals to gain info from provider    Currently in Pain? No/denies                [facility] PT Assessment - 10/21/20 0001       Assessment   Medical Diagnosis L breast cancer    Referring Provider (PT) Gudena    Onset Date/Surgical Date 09/22/20    Hand Dominance Right    Prior Therapy none      Precautions   Precautions Other (comment)    Precaution Comments active cancer      Restrictions   Weight Bearing Restrictions No      Balance Screen   Has the patient fallen in the past 6 months No    Has the patient had a decrease in activity level because of a fear of falling?  No    Is the patient reluctant to leave their home because of a fear of falling?  No      Home Manufacturing systems engineer residence    Living Arrangements Spouse/significant other    Available Help at Discharge Family    Type of [place]      Prior Function   Level of Independence Independent    Vocation Full time employment    Vocation Requirements works from home for [employer]    Leisure pt reports she walks sporadically      Cognition   Overall Cognitive Status Within Functional Limits for tasks assessed      Posture/Postural Control   Posture/Postural Control Postural limitations    Postural Limitations Rounded Shoulders;Forward head      ROM / Strength   AROM / PROM / Strength AROM      AROM   AROM Assessment Site Shoulder    Right/Left Shoulder Right;Left    Right Shoulder Extension 60 Degrees    Right Shoulder Flexion 168 Degrees    Right Shoulder ABduction 170 Degrees    Right Shoulder Internal Rotation 70 Degrees    Right Shoulder External Rotation 98 Degrees  Left Shoulder Extension 65 Degrees    Left Shoulder Flexion 165 Degrees    Left Shoulder ABduction 85 Degrees    Left Shoulder Internal Rotation 80 Degrees    Left Shoulder External Rotation 96 Degrees               LYMPHEDEMA/ONCOLOGY QUESTIONNAIRE - 10/21/20 0001       Type   Cancer Type left breast cancer      Lymphedema Assessments   Lymphedema Assessments Upper extremities      Right Upper Extremity Lymphedema   15 cm Proximal to Olecranon Process 35.5 cm    Olecranon Process 28.5 cm    15 cm Proximal to Ulnar Styloid Process 29 cm    Just Proximal to Ulnar Styloid Process 17.1 cm    Across Hand at [employer] 19 cm    At [place] of 2nd Digit 5.9 cm      Left Upper Extremity Lymphedema   15 cm Proximal to Olecranon Process 35.9 cm    Olecranon Process 28.2 cm    15 cm Proximal to Ulnar Styloid Process 28 cm    Just Proximal to Ulnar Styloid Process 16.5 cm    Across Hand at [employer] 18.8 cm    At [place] of 2nd Digit 6 cm             L-DEX FLOWSHEETS -  10/21/20 1400       L-DEX LYMPHEDEMA SCREENING   Measurement Type Unilateral    L-DEX MEASUREMENT EXTREMITY Upper Extremity    POSITION  Standing    DOMINANT SIDE Right    At Risk Side Left    BASELINE SCORE (UNILATERAL) -3.2             The patient was assessed using the L-Dex machine today to produce a lymphedema index baseline score. The patient will be reassessed on a regular basis (typically every 3 months) to obtain new L-Dex scores. If the score is > 6.5 points away from his/her baseline score indicating onset of subclinical lymphedema, it will be recommended to wear a compression garment for 4 weeks, 12 hours per day and then be reassessed. If the score continues to be > 6.5 points from baseline at reassessment, we will initiate lymphedema treatment. Assessing in this manner has a 95% rate of preventing clinically significant lymphedema.        Objective measurements completed on examination: See above findings.                PT Education - 10/21/20 1500     Education Details ABC class, post op shoulder ROM exercises, anatomy and physiology of lymphatic system, signs and symptoms of lymphedema, SOZO, lymphedema risk reduction practices    Person(s) Educated Patient    Methods Explanation;Handout    Comprehension Verbalized understanding                 PT Long Term Goals - 10/21/20 1459       PT LONG TERM GOAL #1   Title Pt will not demonstrate any signs or symptoms of lymphedema and will return to baseline shoulder ROM measurements    Time 6    Period Months    Status New    Target Date 04/21/21             Breast Clinic Goals - 10/21/20 1604       Patient will be able to verbalize understanding of pertinent lymphedema risk reduction practices relevant  to her diagnosis specifically related to skin care.   Time 1    Period Days    Status Achieved      Patient will be able to return demonstrate and/or verbalize understanding of the  post-op home exercise program related to regaining shoulder range of motion.   Time 1    Period Days    Status Achieved      Patient will be able to verbalize understanding of the importance of attending the postoperative After Breast Cancer Class for further lymphedema risk reduction education and therapeutic exercise.   Time 1    Period Days    Status Achieved                   Plan - 10/21/20 1600     Clinical Impression Statement Pt presents to PT with recently diagnosed L breast cancer - ER+, PR-, HER2-. She will be completing neoadjuvant chemotherapy prior to undergoing surgery. Pt plans to undergo a bilateral mastectomy after she completes her chemo around March 2022. Pt was instructed in post op exercises and educated that if she has a mastectomy then not to begin exercises until a week after the last drain is removed. Baseline ROM and SOZO measurements taken today. She will benefit from a post op PT reassessment to determine needs and in every 3 months for additional L dex screening to detect subclinical lymphedema.    Stability/Clinical Decision Making Stable/Uncomplicated    Clinical Decision Making Low    Rehab Potential Good    PT Frequency --   eval and 1 f/u visit   PT Duration --   6 months   PT Treatment/Interventions ADLs/Self Care Home Management;Therapeutic exercise;Patient/family education    PT Next Visit Plan reassess baselines, sign pt up for ABC class    PT Home Exercise Plan post op breast exercises    Consulted and Agree with Plan of Care Patient             Patient will benefit from skilled therapeutic intervention in order to improve the following deficits and impairments:  Postural dysfunction, Decreased knowledge of precautions  Visit Diagnosis: Abnormal posture  Malignant neoplasm of upper-outer quadrant of left breast in female, estrogen receptor positive ([place])   Patient will follow up at outpatient cancer rehab 3-4 weeks following  surgery.  If the patient requires physical therapy at that time, a specific plan will be dictated and sent to the referring physician for approval. The patient was educated today on appropriate basic range of motion exercises to begin post operatively and the importance of attending the After Breast Cancer class following surgery.  Patient was educated today on lymphedema risk reduction practices as it pertains to recommendations that will benefit the patient immediately following surgery.  She verbalized good understanding.     Problem List Patient Active Problem List   Diagnosis Date Noted   Genetic testing 10/05/2020   Malignant neoplasm of upper-outer quadrant of left breast in female, estrogen receptor positive ([place]) 09/25/2020   Family history of breast cancer 09/25/2020    Manus Gunning, PT 10/21/2020, 4:04 PM  [facility] @ [place], [place], [address] Phone:     Fax:     Name: Latorsha Curling MRN: [id number] Date of Birth: Mar 04, 1995  Manus Gunning, PT 10/21/20 4:04 PM

## 2020-10-27 NOTE — Progress Notes (Signed)
Pharmacist Chemotherapy Monitoring - Initial Assessment    Anticipated start date: 11/03/20   The following has been reviewed per standard work regarding the patient's treatment regimen: The patient's diagnosis, treatment plan and drug doses, and organ/hematologic function Lab orders and baseline tests specific to treatment regimen  The treatment plan start date, drug sequencing, and pre-medications Prior authorization status  Patient's documented medication list, including drug-drug interaction screen and prescriptions for anti-emetics and supportive care specific to the treatment regimen The drug concentrations, fluid compatibility, administration routes, and timing of the medications to be used The patient's access for treatment and lifetime cumulative dose history, if applicable  The patient's medication allergies and previous infusion related reactions, if applicable   Changes made to treatment plan:  Urine pregnancy test with each chemo cycle, orders added  Follow up needed:  Pending authorization for treatment  F/U CMET   Acquanetta Belling, RPH, BCPS, BCOP 10/27/2020  2:15 PM

## 2020-10-31 ENCOUNTER — Encounter (HOSPITAL_COMMUNITY): Payer: Self-pay | Admitting: General Surgery

## 2020-10-31 NOTE — Anesthesia Preprocedure Evaluation (Addendum)
Anesthesia Evaluation  Patient identified by MRN, date of birth, ID band Patient awake    Reviewed: Allergy & Precautions, NPO status , Patient's Chart, lab work & pertinent test results  Airway Mallampati: II  TM Distance: >3 FB Neck ROM: Full    Dental no notable dental hx. (+) Dental Advisory Given, Teeth Intact   Pulmonary neg pulmonary ROS,    Pulmonary exam normal breath sounds clear to auscultation       Cardiovascular negative cardio ROS Normal cardiovascular exam Rhythm:Regular Rate:Normal     Neuro/Psych negative neurological ROS  negative psych ROS   GI/Hepatic negative GI ROS, Neg liver ROS,   Endo/Other  Left Breast Ca  Renal/GU negative Renal ROS  negative genitourinary   Musculoskeletal negative musculoskeletal ROS (+)   Abdominal (+) + obese,   Peds  Hematology negative hematology ROS (+)   Anesthesia Other Findings   Reproductive/Obstetrics                            Anesthesia Physical Anesthesia Plan  ASA: 2  Anesthesia Plan: General   Post-op Pain Management:    Induction: Intravenous  PONV Risk Score and Plan: 4 or greater and Scopolamine patch - Pre-op, Treatment may vary due to age or medical condition, Midazolam and Ondansetron  Airway Management Planned: LMA  Additional Equipment:   Intra-op Plan:   Post-operative Plan: Extubation in OR  Informed Consent: I have reviewed the patients History and Physical, chart, labs and discussed the procedure including the risks, benefits and alternatives for the proposed anesthesia with the patient or authorized representative who has indicated his/her understanding and acceptance.     Dental advisory given  Plan Discussed with: CRNA and Anesthesiologist  Anesthesia Plan Comments:        Anesthesia Quick Evaluation

## 2020-11-02 ENCOUNTER — Ambulatory Visit (HOSPITAL_COMMUNITY): Payer: No Typology Code available for payment source | Admitting: Certified Registered Nurse Anesthetist

## 2020-11-02 ENCOUNTER — Ambulatory Visit (HOSPITAL_COMMUNITY): Payer: No Typology Code available for payment source

## 2020-11-02 ENCOUNTER — Ambulatory Visit (HOSPITAL_COMMUNITY)
Admission: RE | Admit: 2020-11-02 | Discharge: 2020-11-02 | Disposition: A | Discharge status: Home / Self Care | Payer: No Typology Code available for payment source | Source: Ambulatory Visit | Attending: General Surgery | Admitting: General Surgery

## 2020-11-02 ENCOUNTER — Encounter (HOSPITAL_COMMUNITY)
Admission: RE | Disposition: A | Discharge status: Home / Self Care | Payer: Self-pay | Source: Ambulatory Visit | Attending: General Surgery

## 2020-11-02 ENCOUNTER — Other Ambulatory Visit: Payer: Self-pay

## 2020-11-02 ENCOUNTER — Encounter (HOSPITAL_COMMUNITY): Payer: Self-pay | Admitting: General Surgery

## 2020-11-02 DIAGNOSIS — Z803 Family history of malignant neoplasm of breast: Secondary | ICD-10-CM | POA: Diagnosis not present

## 2020-11-02 DIAGNOSIS — Z170 Estrogen receptor positive status [ER+]: Secondary | ICD-10-CM | POA: Insufficient documentation

## 2020-11-02 DIAGNOSIS — Z452 Encounter for adjustment and management of vascular access device: Secondary | ICD-10-CM | POA: Diagnosis not present

## 2020-11-02 DIAGNOSIS — C50812 Malignant neoplasm of overlapping sites of left female breast: Secondary | ICD-10-CM | POA: Diagnosis not present

## 2020-11-02 HISTORY — PX: PORTACATH PLACEMENT: SHX2246

## 2020-11-02 LAB — PREGNANCY, URINE: Preg Test, Ur: NEGATIVE

## 2020-11-02 SURGERY — INSERTION, TUNNELED CENTRAL VENOUS DEVICE, WITH PORT
Anesthesia: General | Site: Chest

## 2020-11-02 MED ORDER — LIDOCAINE 2% (20 MG/ML) 5 ML SYRINGE
INTRAMUSCULAR | Status: DC | PRN
  Administered 2020-11-02: 100 mg via INTRAVENOUS

## 2020-11-02 MED ORDER — SCOPOLAMINE 1 MG/3DAYS TD PT72
1.0000 | MEDICATED_PATCH | TRANSDERMAL | Status: DC
  Administered 2020-11-02: 1 via TRANSDERMAL

## 2020-11-02 MED ORDER — FENTANYL CITRATE PF 50 MCG/ML IJ SOSY: 25.0000 ug | PREFILLED_SYRINGE | INTRAMUSCULAR | Status: DC | PRN

## 2020-11-02 MED ORDER — FENTANYL CITRATE (PF) 100 MCG/2ML IJ SOLN
INTRAMUSCULAR | Status: DC | PRN
  Administered 2020-11-02: 50 ug via INTRAVENOUS
  Administered 2020-11-02 (×4): 25 ug via INTRAVENOUS

## 2020-11-02 MED ORDER — ONDANSETRON HCL 4 MG/2ML IJ SOLN: 4.0000 mg | Freq: Once | INTRAMUSCULAR | Status: DC | PRN

## 2020-11-02 MED ORDER — HEPARIN SOD (PORK) LOCK FLUSH 100 UNIT/ML IV SOLN
INTRAVENOUS | Status: DC | PRN
  Administered 2020-11-02: 500 [IU] via INTRAVENOUS

## 2020-11-02 MED ORDER — HEPARIN SOD (PORK) LOCK FLUSH 100 UNIT/ML IV SOLN
INTRAVENOUS | Status: AC
  Filled 2020-11-02: qty 5

## 2020-11-02 MED ORDER — TRAMADOL HCL 50 MG PO TABS: 100.0000 mg | ORAL_TABLET | Freq: Four times a day (QID) | ORAL | 0 refills | Status: DC | PRN

## 2020-11-02 MED ORDER — LIDOCAINE HCL (PF) 2 % IJ SOLN
INTRAMUSCULAR | Status: AC
  Filled 2020-11-02: qty 5

## 2020-11-02 MED ORDER — FENTANYL CITRATE (PF) 100 MCG/2ML IJ SOLN
INTRAMUSCULAR | Status: AC
  Filled 2020-11-02: qty 2

## 2020-11-02 MED ORDER — DEXAMETHASONE SODIUM PHOSPHATE 10 MG/ML IJ SOLN
INTRAMUSCULAR | Status: DC | PRN
  Administered 2020-11-02: 5 mg via INTRAVENOUS

## 2020-11-02 MED ORDER — 0.9 % SODIUM CHLORIDE (POUR BTL) OPTIME
TOPICAL | Status: DC | PRN
  Administered 2020-11-02: 1000 mL

## 2020-11-02 MED ORDER — CHLORHEXIDINE GLUCONATE 0.12 % MT SOLN
15.0000 mL | Freq: Once | OROMUCOSAL | Status: AC
  Administered 2020-11-02: 15 mL via OROMUCOSAL

## 2020-11-02 MED ORDER — MIDAZOLAM HCL 5 MG/5ML IJ SOLN
INTRAMUSCULAR | Status: DC | PRN
  Administered 2020-11-02: 2 mg via INTRAVENOUS

## 2020-11-02 MED ORDER — CEFAZOLIN SODIUM-DEXTROSE 2-4 GM/100ML-% IV SOLN
2.0000 g | INTRAVENOUS | Status: AC
  Administered 2020-11-02: 2 g via INTRAVENOUS
  Filled 2020-11-02: qty 100

## 2020-11-02 MED ORDER — BUPIVACAINE-EPINEPHRINE (PF) 0.25% -1:200000 IJ SOLN
INTRAMUSCULAR | Status: AC
  Filled 2020-11-02: qty 30

## 2020-11-02 MED ORDER — OXYCODONE HCL 5 MG PO TABS: 5.0000 mg | ORAL_TABLET | Freq: Once | ORAL | Status: DC | PRN

## 2020-11-02 MED ORDER — HEPARIN 6000 UNIT IRRIGATION SOLUTION
Freq: Once | Status: AC
  Administered 2020-11-02: 1
  Filled 2020-11-02: qty 6000

## 2020-11-02 MED ORDER — PROPOFOL 10 MG/ML IV BOLUS
INTRAVENOUS | Status: DC | PRN
  Administered 2020-11-02: 200 mg via INTRAVENOUS

## 2020-11-02 MED ORDER — CHLORHEXIDINE GLUCONATE CLOTH 2 % EX PADS: 6.0000 | MEDICATED_PAD | Freq: Once | CUTANEOUS | Status: DC

## 2020-11-02 MED ORDER — BUPIVACAINE-EPINEPHRINE 0.25% -1:200000 IJ SOLN
INTRAMUSCULAR | Status: DC | PRN
  Administered 2020-11-02: 7 mL

## 2020-11-02 MED ORDER — LACTATED RINGERS IV SOLN: INTRAVENOUS | Status: DC

## 2020-11-02 MED ORDER — MIDAZOLAM HCL 2 MG/2ML IJ SOLN
INTRAMUSCULAR | Status: AC
  Filled 2020-11-02: qty 2

## 2020-11-02 MED ORDER — ONDANSETRON HCL 4 MG/2ML IJ SOLN
INTRAMUSCULAR | Status: DC | PRN
Start: 2020-11-02 — End: 2020-11-02
  Administered 2020-11-02: 4 mg via INTRAVENOUS

## 2020-11-02 MED ORDER — OXYCODONE HCL 5 MG/5ML PO SOLN: 5.0000 mg | Freq: Once | ORAL | Status: DC | PRN

## 2020-11-02 MED ORDER — ORAL CARE MOUTH RINSE: 15.0000 mL | Freq: Once | OROMUCOSAL | Status: AC

## 2020-11-02 MED ORDER — SCOPOLAMINE 1 MG/3DAYS TD PT72
MEDICATED_PATCH | TRANSDERMAL | Status: AC
  Filled 2020-11-02: qty 1

## 2020-11-02 MED ORDER — ACETAMINOPHEN 500 MG PO TABS
1000.0000 mg | ORAL_TABLET | ORAL | Status: AC
  Administered 2020-11-02: 1000 mg via ORAL
  Filled 2020-11-02: qty 2

## 2020-11-02 MED ORDER — PROPOFOL 10 MG/ML IV BOLUS
INTRAVENOUS | Status: AC
  Filled 2020-11-02: qty 20

## 2020-11-02 MED ORDER — ENSURE PRE-SURGERY PO LIQD: 296.0000 mL | Freq: Once | ORAL | Status: DC

## 2020-11-02 MED ORDER — DEXAMETHASONE SODIUM PHOSPHATE 10 MG/ML IJ SOLN
INTRAMUSCULAR | Status: AC
  Filled 2020-11-02: qty 1

## 2020-11-02 MED ORDER — ONDANSETRON HCL 4 MG/2ML IJ SOLN
INTRAMUSCULAR | Status: AC
  Filled 2020-11-02: qty 2

## 2020-11-02 MED FILL — Fosaprepitant Dimeglumine For IV Infusion 150 MG (Base Eq): INTRAVENOUS | Qty: 5 | Status: AC

## 2020-11-02 MED FILL — Dexamethasone Sodium Phosphate Inj 100 MG/10ML: INTRAMUSCULAR | Qty: 1 | Status: AC

## 2020-11-02 SURGICAL SUPPLY — 42 items
ADH SKN CLS APL DERMABOND .7 (GAUZE/BANDAGES/DRESSINGS) ×1
APL PRP STRL LF DISP 70% ISPRP (MISCELLANEOUS) ×1
APL SKNCLS STERI-STRIP NONHPOA (GAUZE/BANDAGES/DRESSINGS)
BAG COUNTER SPONGE SURGICOUNT (BAG) IMPLANT
BAG DECANTER FOR FLEXI CONT (MISCELLANEOUS) ×2 IMPLANT
BAG SPNG CNTER NS LX DISP (BAG)
BENZOIN TINCTURE PRP APPL 2/3 (GAUZE/BANDAGES/DRESSINGS) IMPLANT
BLADE SURG 15 STRL LF DISP TIS (BLADE) ×1 IMPLANT
BLADE SURG 15 STRL SS (BLADE) ×2
BLADE SURG SZ11 CARB STEEL (BLADE) ×2 IMPLANT
CHLORAPREP W/TINT 26 (MISCELLANEOUS) ×2 IMPLANT
COVER SURGICAL LIGHT HANDLE (MISCELLANEOUS) ×2 IMPLANT
DECANTER SPIKE VIAL GLASS SM (MISCELLANEOUS) ×2 IMPLANT
DERMABOND ADVANCED (GAUZE/BANDAGES/DRESSINGS) ×1
DERMABOND ADVANCED .7 DNX12 (GAUZE/BANDAGES/DRESSINGS) ×1 IMPLANT
DRAPE C-ARM 42X120 X-RAY (DRAPES) ×2 IMPLANT
DRAPE LAPAROSCOPIC ABDOMINAL (DRAPES) ×2 IMPLANT
DRSG TEGADERM 2-3/8X2-3/4 SM (GAUZE/BANDAGES/DRESSINGS) IMPLANT
DRSG TEGADERM 4X4.75 (GAUZE/BANDAGES/DRESSINGS) ×4 IMPLANT
ELECT REM PT RETURN 15FT ADLT (MISCELLANEOUS) ×2 IMPLANT
GAUZE 4X4 16PLY ~~LOC~~+RFID DBL (SPONGE) ×2 IMPLANT
GAUZE SPONGE 4X4 12PLY STRL (GAUZE/BANDAGES/DRESSINGS) ×2 IMPLANT
GLOVE SURG ENC MOIS LTX SZ7 (GLOVE) ×2 IMPLANT
GLOVE SURG UNDER POLY LF SZ7.5 (GLOVE) ×2 IMPLANT
GOWN STRL REUS W/TWL LRG LVL3 (GOWN DISPOSABLE) ×2 IMPLANT
GOWN STRL REUS W/TWL XL LVL3 (GOWN DISPOSABLE) ×2 IMPLANT
KIT BASIN OR (CUSTOM PROCEDURE TRAY) ×2 IMPLANT
KIT PORT POWER 8FR ISP CVUE (Port) ×2 IMPLANT
KIT TURNOVER KIT A (KITS) ×2 IMPLANT
NEEDLE HYPO 25X1 1.5 SAFETY (NEEDLE) ×2 IMPLANT
PACK BASIC VI WITH GOWN DISP (CUSTOM PROCEDURE TRAY) ×2 IMPLANT
PENCIL SMOKE EVACUATOR (MISCELLANEOUS) IMPLANT
SUT MNCRL AB 4-0 PS2 18 (SUTURE) ×2 IMPLANT
SUT PROLENE 2 0 SH DA (SUTURE) ×2 IMPLANT
SUT SILK 2 0 (SUTURE)
SUT SILK 2-0 30XBRD TIE 12 (SUTURE) IMPLANT
SUT VIC AB 3-0 SH 27 (SUTURE) ×2
SUT VIC AB 3-0 SH 27XBRD (SUTURE) ×1 IMPLANT
SYR 10ML LL (SYRINGE) ×2 IMPLANT
SYR CONTROL 10ML LL (SYRINGE) ×2 IMPLANT
TOWEL OR 17X26 10 PK STRL BLUE (TOWEL DISPOSABLE) ×2 IMPLANT
TOWEL OR NON WOVEN STRL DISP B (DISPOSABLE) ×2 IMPLANT

## 2020-11-02 NOTE — Anesthesia Postprocedure Evaluation (Signed)
Anesthesia Post Note  Patient: Allison Dodson  Procedure(s) Performed: INSERTION PORT-A-CATH (Chest)     Patient location during evaluation: PACU Anesthesia Type: General Level of consciousness: awake and alert and oriented Pain management: pain level controlled Vital Signs Assessment: post-procedure vital signs reviewed and stable Respiratory status: spontaneous breathing, nonlabored ventilation and respiratory function stable Cardiovascular status: blood pressure returned to baseline and stable Postop Assessment: no apparent nausea or vomiting Anesthetic complications: no   No notable events documented.  Last Vitals:  Vitals:   11/02/20 0820 11/02/20 0830  BP:  (!) 153/84  Pulse: 86 77  Resp: (!) 21 13  Temp:    SpO2: 100% 97%    Last Pain:  Vitals:   11/02/20 0830  TempSrc:   PainSc: 0-No pain                 Dawnetta Copenhaver A.

## 2020-11-02 NOTE — Interval H&P Note (Signed)
History and Physical Interval Note:  11/02/2020 6:54 AM  Allison Dodson  has presented today for surgery, with the diagnosis of BREAST CANCER.  The various methods of treatment have been discussed with the patient and family. After consideration of risks, benefits and other options for treatment, the patient has consented to  Procedure(s): INSERTION PORT-A-CATH (N/A) as a surgical intervention.  The patient's history has been reviewed, patient examined, no change in status, stable for surgery.  I have reviewed the patient's chart and labs.  Questions were answered to the patient's satisfaction.     Rolm Bookbinder

## 2020-11-02 NOTE — Transfer of Care (Signed)
Immediate Anesthesia Transfer of Care Note  Patient: Allison Dodson  Procedure(s) Performed: INSERTION PORT-A-CATH (Chest)  Patient Location: PACU  Anesthesia Type:General  Level of Consciousness: awake, drowsy and patient cooperative  Airway & Oxygen Therapy: Patient Spontanous Breathing and Patient connected to face mask oxygen  Post-op Assessment: Report given to RN and Post -op Vital signs reviewed and stable  Post vital signs: Reviewed and stable  Last Vitals:  Vitals Value Taken Time  BP 143/93 11/02/20 0815  Temp 36.4 C 11/02/20 0815  Pulse 101 11/02/20 0815  Resp 21 11/02/20 0815  SpO2 100 % 11/02/20 0815  Vitals shown include unvalidated device data.  Last Pain:  Vitals:   11/02/20 0555  TempSrc: Oral  PainSc:          Complications: No notable events documented.

## 2020-11-02 NOTE — Op Note (Signed)
Preoperative diagnosis: left breast cancer, need for venous access for chemotherapy Postoperative diagnosis: Same as above Procedure: Right internal jugular port placement Surgeon: Dr. Serita Grammes Anesthesia: General Estimated blood loss: Minimal Specimens: None Drains: None Complications: None Special count was correct completion Disposition recovery stable edition  Indications: 86 yof with newly diagnosed left breast cancer that is a grade III IDC that is weak 40% er pos, pr neg, her 2 neg and Ki is 60%. She has been seen in multidisciplinary fashion and we have elected to proceed with primary chemotherapy. I discussed port placement.   Procedure: After informed consent was obtained she was taken to the operating room.   She was given antibiotics.  SCDs were in place.  She was placed under general anesthesia without complication.  She was prepped and draped in a standard sterile surgical fashion.  Surgical timeout was then performed.  I then identified her right internal jugular vein with the ultrasound.  I made a small nick in the skin.  I then accessed this on the first pass with the needle.  The wire was placed.  This was confirmed to be in the vein by the ultrasound as well as by fluoroscopy.  I then placed local into the area below her clavicle where the pocket was made.  I made incision and developed a pocket.  I then tunneled the line between the 2 sites.  The dilator was then placed over the wire.  This was done using fluoroscopy.  The wire was then removed.  The line was placed through the sheath and the sheath removed.  I pulled the line back to be in the distal cava near the cavoatrial junction.  This was a good position upon completion.  I then hooked the port up to the line.  I sutured this position with 2-0 Prolene suture.  I then accessed the port and left accessed for chemotherapy tomorrow.   Aspirated blood and flushed easily.  I placed heparin in the port.  I then got a final  completion film that showed this all to be in good position.  I then closed with 3-0 Vicryl 4-0 Monocryl.  Glue was placed.  She tolerated this well and was transferred to recovery.

## 2020-11-02 NOTE — Discharge Instructions (Signed)
PORT-A-CATH: POST OP INSTRUCTIONS  Always review your discharge instruction sheet given to you by the facility where your surgery was performed.   A prescription for pain medication may be given to you upon discharge. Take your pain medication as prescribed, if needed. If narcotic pain medicine is not needed, then you make take acetaminophen (Tylenol) or ibuprofen (Advil) as needed.  Take your usually prescribed medications unless otherwise directed. If you need a refill on your pain medication, please contact our office. All narcotic pain medicine now requires a paper prescription.  Phoned in and fax refills are no longer allowed by law.  Prescriptions will not be filled after 5 pm or on weekends.  You should follow a light diet for the remainder of the day after your procedure. Most patients will experience some mild swelling and/or bruising in the area of the incision. It may take several days to resolve. It is common to experience some constipation if taking pain medication after surgery. Increasing fluid intake and taking a stool softener (such as Colace) will usually help or prevent this problem from occurring. A mild laxative (Milk of Magnesia or Miralax) should be taken according to package directions if there are no bowel movements after 48 hours.  Unless discharge instructions indicate otherwise, you may remove your bandages 48 hours after surgery, and you may shower at that time. You may have steri-strips (small white skin tapes) in place directly over the incision.  These strips should be left on the skin for 7-10 days.  If your surgeon used Dermabond (skin glue) on the incision, you may shower in 24 hours.  The glue will flake off over the next 2-3 weeks.  If your port is left accessed at the end of surgery (needle left in port), the dressing cannot get wet and should only by changed by a healthcare professional. When the port is no longer accessed (when the needle has been removed), follow  step 7.   ACTIVITIES:  Limit activity involving your arms for the next 72 hours. Do no strenuous exercise or activity for 1 week. You may drive when you are no longer taking prescription pain medication, you can comfortably wear a seatbelt, and you can maneuver your car. 10.You may need to see your doctor in the office for a follow-up appointment.  Please       check with your doctor.  11.When you receive a new Port-a-Cath, you will get a product guide and        ID card.  Please keep them in case you need them.  WHEN TO CALL YOUR DOCTOR ([phone]): Fever over 101.0 Chills Continued bleeding from incision Increased redness and tenderness at the site Shortness of breath, difficulty breathing   The clinic staff is available to answer your questions during regular business hours. Please don't hesitate to call and ask to speak to one of the nurses or medical assistants for clinical concerns. If you have a medical emergency, go to the nearest emergency room or call 911.  A surgeon from [facility] is always on call at the hospital.     For further information, please visit [email or web address]      

## 2020-11-02 NOTE — H&P (Signed)
21 yof with significant family history of breast cancer. She has no dc. She has no prior history. She noted a mass in august. She has c density breasts. US shows a 3 cm mass at 3 oclock in left breast. Ax [place] is negative. Right breast is negative. There is also a poss 5 mm satellite nodule as well. Biopsy is a grade III IDC that is weakly 40% er pos, pr neg, her 2 neg, and Ki is 60% She works from home for [employer] in [place] She does desire children She is due to get married in one month- her fiancee is here with her  Review of Systems: A complete review of systems was obtained from the patient. I have reviewed this information and discussed as appropriate with the patient. See HPI as well for other ROS.  Review of Systems  Psychiatric/Behavioral: The patient is nervous/anxious.  All other systems reviewed and are negative.   Medical History: Past Medical History:  Diagnosis Date   History of cancer   There is no problem list on file for this patient.  History reviewed. No pertinent surgical history.   No Known Allergies  Current Outpatient Medications on File Prior to Visit  Medication Sig Dispense Refill   ALPRAZolam (XANAX) 0.25 MG tablet Take 0.25 mg by mouth at bedtime as needed for Sleep   No current facility-administered medications on file prior to visit.   History reviewed. No pertinent family history.   Social History   Tobacco Use  Smoking Status Never Smoker  Smokeless Tobacco Never Used    Social History   Socioeconomic History   Marital status: Married  Tobacco Use   Smoking status: Never Smoker   Smokeless tobacco: Never Used  Substance and Sexual Activity   Alcohol use: Not Currently   Drug use: Not Currently   Objective:   Body mass index is 38.88 kg/m.  Physical Exam Constitutional:  Appearance: Normal appearance.  Chest:  Breasts:  Right: No mass or nipple discharge.  Left: Mass present. No nipple discharge.   Comments: 3 cm mobile  mass Lymphadenopathy:  Upper Body:  Right upper body: No supraclavicular or axillary adenopathy.  Left upper body: No supraclavicular or axillary adenopathy.  Neurological:  Mental Status: She is alert.    Assessment and Plan:   Carcinoma of upper-outer quadrant of left breast in female, estrogen receptor positive (CMS-HCC) Port placement  We discussed the staging and pathophysiology of breast cancer. We discussed all of the different options for treatment for breast cancer including surgery, chemotherapy, radiation therapy, Herceptin, and antiestrogen therapy. We discussed a sentinel lymph node biopsy as she does not appear to having lymph node involvement right now. We discussed the performance of that with injection of radioactive tracer. We discussed that there is a chance of having a positive node with a sentinel lymph node biopsy and we will await the permanent pathology to make any other first further decisions in terms of her treatment. We discussed up to a 5% risk lifetime of chronic shoulder pain as well as lymphedema associated with a sentinel lymph node biopsy. We discussed the options for treatment of the breast cancer which included lumpectomy versus a mastectomy. We discussed the performance of the lumpectomy with radioactive seed placement. We discussed a 5-10% chance of a positive margin requiring reexcision in the operating room. We also discussed that she will likely need radiation therapy if she undergoes lumpectomy. We discussed mastectomy and the postoperative care for that as well. Mastectomy  can be followed by reconstruction. The decision for lumpectomy vs mastectomy has no impact on decision for chemotherapy. Most mastectomy patients will not need radiation therapy. We discussed that there is no difference in her survival whether she undergoes lumpectomy with radiation therapy or antiestrogen therapy versus a mastectomy. There is also no real difference between her recurrence  in the breast. We are going to obtain genetics and an MRI. We discussed the possibility of primary chemotherapy first as well as referral to a fertility clinic. I will await her oncology visit and then we will come up with a plan after that

## 2020-11-02 NOTE — Anesthesia Procedure Notes (Signed)
Procedure Name: LMA Insertion Date/Time: 11/02/2020 7:22 AM Performed by: West Pugh, CRNA Pre-anesthesia Checklist: Patient identified, Emergency Drugs available, Suction available, Patient being monitored and Timeout performed Patient Re-evaluated:Patient Re-evaluated prior to induction Oxygen Delivery Method: Circle system utilized Preoxygenation: Pre-oxygenation with 100% oxygen Induction Type: IV induction LMA: LMA with gastric port inserted Number of attempts: 1 Placement Confirmation: positive ETCO2 Tube secured with: Tape Dental Injury: Teeth and Oropharynx as per pre-operative assessment

## 2020-11-02 NOTE — Progress Notes (Signed)
Patient Care Team: Kerry Dory, NP as PCP - General (Nurse Practitioner) Rockwell Germany, RN as Oncology Nurse Navigator Mauro Kaufmann, RN as Oncology Nurse Navigator Nicholas Lose, MD as Consulting Physician (Hematology and Oncology) Rolm Bookbinder, MD as Consulting Physician (General Surgery)  DIAGNOSIS:    ICD-10-CM   1. Malignant neoplasm of upper-outer quadrant of left breast in female, estrogen receptor positive ([place])  C50.412    Z17.0       SUMMARY OF ONCOLOGIC HISTORY: Oncology History  Malignant neoplasm of upper-outer quadrant of left breast in female, estrogen receptor positive ([place])  09/25/2020 Initial Diagnosis   Palpable lump in the left breast.  Mammogram revealed 3 cm tumor and a possible 5 mm satellite nodule.  Biopsy revealed grade 3 IDC ER 40% weak, PR 0%, HER2 negative, Ki-67 60%   09/25/2020 Cancer Staging   Staging form: Breast, AJCC 8th Edition - Clinical stage from 09/25/2020: Stage IIB (cT2, cN0, cM0, G3, ER+, PR-, HER2-) - Signed by Nicholas Lose, MD on 09/25/2020 Stage prefix: Initial diagnosis Histologic grading system: 3 grade system   10/02/2020 Genetic Testing   Negative hereditary cancer genetic testing: no pathogenic variants detected in [facility] BRCAPlus Panel and [facility] CancerNext-Expanded +RNAinsight Panel.  The report dates are October 02, 2020 and October 13, 2020, respectively.   The BRCAplus panel offered by [employer] and includes sequencing and deletion/duplication analysis for the following 8 genes: ATM, BRCA1, BRCA2, CDH1, CHEK2, PALB2, PTEN, and TP53.  The CancerNext-Expanded gene panel offered by [facility] and includes sequencing, rearrangement, and RNA analysis for the following 77 genes: AIP, ALK, APC, ATM, AXIN2, BAP1, BARD1, BLM, BMPR1A, BRCA1, BRCA2, BRIP1, CDC73, CDH1, CDK4, CDKN1B, CDKN2A, CHEK2, CTNNA1, DICER1, FANCC, FH, FLCN, GALNT12, KIF1B, LZTR1, MAX, MEN1, MET, MLH1, MSH2, MSH3, MSH6, MUTYH, NBN, NF1, NF2,  NTHL1, PALB2, PHOX2B, PMS2, POT1, PRKAR1A, PTCH1, PTEN, RAD51C, RAD51D, RB1, RECQL, RET, SDHA, SDHAF2, SDHB, SDHC, SDHD, SMAD4, SMARCA4, SMARCB1, SMARCE1, STK11, SUFU, TMEM127, TP53, TSC1, TSC2, VHL and XRCC2 (sequencing and deletion/duplication); EGFR, EGLN1, HOXB13, KIT, MITF, PDGFRA, POLD1, and POLE (sequencing only); EPCAM and GREM1 (deletion/duplication only).    11/03/2020 -  Chemotherapy   Patient is on Treatment Plan : BREAST ADJUVANT DOSE DENSE AC q14d / PACLitaxel q7d       CHIEF COMPLIANT: Cycle 1 AC  INTERVAL HISTORY: Allison Dodson is a 25 y.o. with above-mentioned history of left breast cancer, currently undergoing dose dense Adriamycin and Cytoxan followed by Taxol. She presents to the clinic today for cycle 1.  Her wedding went very well and she is here today to start her chemotherapy.  ALLERGIES:  has No Known Allergies.  MEDICATIONS:  Current Outpatient Medications  Medication Sig Dispense Refill   dexamethasone (DECADRON) 4 MG tablet Take 1 tablet (4 mg total) by mouth daily. Take 1 tab day after chemo and 1 tab 2 days after chemo with food. 8 tablet 1   lidocaine-prilocaine (EMLA) cream Apply to affected area once 30 g 3   LORazepam (ATIVAN) 0.5 MG tablet Take 1 tablet (0.5 mg total) by mouth at bedtime as needed (Nausea or vomiting). 30 tablet 0   ondansetron (ZOFRAN) 8 MG tablet Take 1 tablet (8 mg total) by mouth 2 (two) times daily as needed. Start on the third day after chemotherapy. 30 tablet 1   prochlorperazine (COMPAZINE) 10 MG tablet Take 1 tablet (10 mg total) by mouth every 6 (six) hours as needed (Nausea or vomiting). 30 tablet 1   traMADol (  ULTRAM) 50 MG tablet Take 2 tablets (100 mg total) by mouth every 6 (six) hours as needed. 8 tablet 0   No current facility-administered medications for this visit.    PHYSICAL EXAMINATION: ECOG PERFORMANCE STATUS: 1 - Symptomatic but completely ambulatory  Vitals:   11/03/20 0915  BP: 129/71  Pulse: 92  Resp: 18   Temp: (!) 97.3 F (36.3 C)  SpO2: 100%   Filed Weights   11/03/20 0915  Weight: 220 lb 12.8 oz (100.2 kg)     LABORATORY DATA:  I have reviewed the data as listed No flowsheet data found.  Lab Results  Component Value Date   WBC 8.9 11/03/2020   HGB 11.6 (L) 11/03/2020   HCT 34.5 (L) 11/03/2020   MCV 84.4 11/03/2020   PLT 263 11/03/2020   NEUTROABS 5.2 11/03/2020    ASSESSMENT & PLAN:  Malignant neoplasm of upper-outer quadrant of left breast in female, estrogen receptor positive ([place]) 09/22/2020: Palpable mass in the left breast corresponding to suspicious mass on mammogram measuring 3 cm, possible 5 mm satellite nodule, left axilla negative, right breast negative ER 40% weak, PR negative, HER2 negative, Ki-67 60%  Breast MRI 09/28/2020: 3.7 cm enhancing mass at 3 o'clock position left breast with a possible 7 mm satellite nodule, no abnormal lymph nodes.  Treatment plan: 1.  Neoadjuvant chemotherapy with dose dense Adriamycin and Cytoxan followed by Taxol weekly x12 2. followed by mastectomy (patient's preference) 2. adjuvant radiation therapy (she will not need radiation if she has a mastectomy) 3.  Followed by adjuvant antiestrogen therapy with ovarian function suppression and aromatase inhibitor therapy Patient had her wedding on 10/22/2020 ---------------------------------------------------------------------------------------------------------------------------------------- Current treatment: Cycle 1 day 1 dose dense Adriamycin and Cytoxan Echocardiogram 10/13/2020: EF 60 to 65% Labs reviewed, chemo education completed, chemo consent obtained, antiemetics were reviewed Return to clinic in 1 week for toxicity check    No orders of the defined types were placed in this encounter.  The patient has a good understanding of the overall plan. she agrees with it. she will call with any problems that may develop before the next visit here.  Total time spent: 30 mins  including face to face time and time spent for planning, charting and coordination of care  Rulon Eisenmenger, MD, MPH 11/03/2020  I, Thana Ates, am acting as scribe for Dr. Nicholas Lose.  I have reviewed the above documentation for accuracy and completeness, and I agree with the above.

## 2020-11-03 ENCOUNTER — Encounter (HOSPITAL_COMMUNITY): Payer: Self-pay | Admitting: General Surgery

## 2020-11-03 ENCOUNTER — Inpatient Hospital Stay: Payer: No Typology Code available for payment source | Attending: Hematology and Oncology

## 2020-11-03 ENCOUNTER — Encounter: Payer: Self-pay | Admitting: *Deleted

## 2020-11-03 ENCOUNTER — Inpatient Hospital Stay (HOSPITAL_BASED_OUTPATIENT_CLINIC_OR_DEPARTMENT_OTHER): Payer: No Typology Code available for payment source | Admitting: Hematology and Oncology

## 2020-11-03 ENCOUNTER — Inpatient Hospital Stay: Payer: No Typology Code available for payment source

## 2020-11-03 DIAGNOSIS — Z5111 Encounter for antineoplastic chemotherapy: Secondary | ICD-10-CM | POA: Insufficient documentation

## 2020-11-03 DIAGNOSIS — K5900 Constipation, unspecified: Secondary | ICD-10-CM | POA: Insufficient documentation

## 2020-11-03 DIAGNOSIS — C50412 Malignant neoplasm of upper-outer quadrant of left female breast: Secondary | ICD-10-CM | POA: Diagnosis present

## 2020-11-03 DIAGNOSIS — Z95828 Presence of other vascular implants and grafts: Secondary | ICD-10-CM | POA: Insufficient documentation

## 2020-11-03 DIAGNOSIS — Z170 Estrogen receptor positive status [ER+]: Secondary | ICD-10-CM | POA: Diagnosis not present

## 2020-11-03 DIAGNOSIS — Z79899 Other long term (current) drug therapy: Secondary | ICD-10-CM | POA: Diagnosis not present

## 2020-11-03 LAB — CMP (CANCER CENTER ONLY)
ALT: 13 U/L (ref 0–44)
AST: 11 U/L — ABNORMAL LOW (ref 15–41)
Albumin: 3.6 g/dL (ref 3.5–5.0)
Alkaline Phosphatase: 43 U/L (ref 38–126)
Anion gap: 9 (ref 5–15)
BUN: 10 mg/dL (ref 6–20)
CO2: 24 mmol/L (ref 22–32)
Calcium: 9.1 mg/dL (ref 8.9–10.3)
Chloride: 107 mmol/L (ref 98–111)
Creatinine: 0.65 mg/dL (ref 0.44–1.00)
GFR, Estimated: >60 mL/min (ref >60)
Glucose, Bld: 93 mg/dL (ref 70–99)
Potassium: 3.6 mmol/L (ref 3.5–5.1)
Sodium: 140 mmol/L (ref 135–145)
Total Bilirubin: 0.3 mg/dL (ref 0.3–1.2)
Total Protein: 7 g/dL (ref 6.5–8.1)

## 2020-11-03 LAB — CBC WITH DIFFERENTIAL (CANCER CENTER ONLY)
Abs Immature Granulocytes: 0.02 10*3/uL (ref 0.00–0.07)
Basophils Absolute: 0 10*3/uL (ref 0.0–0.1)
Basophils Relative: 0 %
Eosinophils Absolute: 0 10*3/uL (ref 0.0–0.5)
Eosinophils Relative: 0 %
HCT: 34.5 % — ABNORMAL LOW (ref 36.0–46.0)
Hemoglobin: 11.6 g/dL — ABNORMAL LOW (ref 12.0–15.0)
Immature Granulocytes: 0 %
Lymphocytes Relative: 36 %
Lymphs Abs: 3.2 10*3/uL (ref 0.7–4.0)
MCH: 28.4 pg (ref 26.0–34.0)
MCHC: 33.6 g/dL (ref 30.0–36.0)
MCV: 84.4 fL (ref 80.0–100.0)
Monocytes Absolute: 0.5 10*3/uL (ref 0.1–1.0)
Monocytes Relative: 5 %
Neutro Abs: 5.2 10*3/uL (ref 1.7–7.7)
Neutrophils Relative %: 59 %
Platelet Count: 263 10*3/uL (ref 150–400)
RBC: 4.09 MIL/uL (ref 3.87–5.11)
RDW: 12.9 % (ref 11.5–15.5)
WBC Count: 8.9 10*3/uL (ref 4.0–10.5)
nRBC: 0 % (ref 0.0–0.2)

## 2020-11-03 LAB — PREGNANCY, URINE: Preg Test, Ur: NEGATIVE

## 2020-11-03 MED ORDER — SODIUM CHLORIDE 0.9 % IV SOLN
600.0000 mg/m2 | Freq: Once | INTRAVENOUS | Status: AC
  Administered 2020-11-03: 1200 mg via INTRAVENOUS
  Filled 2020-11-03: qty 60

## 2020-11-03 MED ORDER — PALONOSETRON HCL INJECTION 0.25 MG/5ML
0.2500 mg | Freq: Once | INTRAVENOUS | Status: AC
  Administered 2020-11-03: 0.25 mg via INTRAVENOUS
  Filled 2020-11-03: qty 5

## 2020-11-03 MED ORDER — SODIUM CHLORIDE 0.9 % IV SOLN: Freq: Once | INTRAVENOUS | Status: AC

## 2020-11-03 MED ORDER — DOXORUBICIN HCL CHEMO IV INJECTION 2 MG/ML
60.0000 mg/m2 | Freq: Once | INTRAVENOUS | Status: AC
  Administered 2020-11-03: 120 mg via INTRAVENOUS
  Filled 2020-11-03: qty 60

## 2020-11-03 MED ORDER — SODIUM CHLORIDE 0.9% FLUSH
10.0000 mL | INTRAVENOUS | Status: DC | PRN
  Administered 2020-11-03: 10 mL via INTRAVENOUS

## 2020-11-03 MED ORDER — SODIUM CHLORIDE 0.9 % IV SOLN
10.0000 mg | Freq: Once | INTRAVENOUS | Status: AC
  Administered 2020-11-03: 10 mg via INTRAVENOUS
  Filled 2020-11-03: qty 10

## 2020-11-03 MED ORDER — SODIUM CHLORIDE 0.9 % IV SOLN
150.0000 mg | Freq: Once | INTRAVENOUS | Status: AC
  Administered 2020-11-03: 150 mg via INTRAVENOUS
  Filled 2020-11-03: qty 150

## 2020-11-03 NOTE — Assessment & Plan Note (Signed)
09/22/2020: Palpable mass in the left breast corresponding to suspicious mass on mammogram measuring 3 cm, possible 5 mm satellite nodule, left axilla negative, right breast negative ER 40% weak, PR negative, HER2 negative, Ki-67 60%  Breast MRI 09/28/2020: 3.7 cm enhancing mass at 3 o'clock position left breast with a possible 7 mm satellite nodule, no abnormal lymph nodes.  Treatment plan: 1.  Neoadjuvant chemotherapy with dose dense Adriamycin and Cytoxan followed by Taxol weekly x12 2. adjuvant radiation therapy 3.  Followed by adjuvant antiestrogen therapy with ovarian function suppression and aromatase inhibitor therapy Patient had her wedding on 10/22/2020 ---------------------------------------------------------------------------------------------------------------------------------------- Current treatment: Cycle 1 day 1 dose dense Adriamycin and Cytoxan Echocardiogram 10/13/2020: EF 60 to 65% Labs reviewed, chemo education completed, chemo consent obtained, antiemetics were reviewed Return to clinic in 1 week for toxicity check

## 2020-11-03 NOTE — Patient Instructions (Signed)
[facility] MEDICAL ONCOLOGY  Discharge Instructions: Thank you for choosing [facility] to provide your oncology and hematology care.   If you have a lab appointment with the Cancer Center, please go directly to the Cancer Center and check in at the registration area.   Wear comfortable clothing and clothing appropriate for easy access to any Portacath or PICC line.   We strive to give you quality time with your provider. You may need to reschedule your appointment if you arrive late (15 or more minutes).  Arriving late affects you and other patients whose appointments are after yours.  Also, if you miss three or more appointments without notifying the office, you may be dismissed from the clinic at the provider's discretion.      For prescription refill requests, have your pharmacy contact our office and allow 72 hours for refills to be completed.    Today you received the following chemotherapy and/or immunotherapy agents: Adriamycin, Cytoxan     To help prevent nausea and vomiting after your treatment, we encourage you to take your nausea medication as directed.  BELOW ARE SYMPTOMS THAT SHOULD BE REPORTED IMMEDIATELY: . *FEVER GREATER THAN 100.4 F (38 C) OR HIGHER . *CHILLS OR SWEATING . *NAUSEA AND VOMITING THAT IS NOT CONTROLLED WITH YOUR NAUSEA MEDICATION . *UNUSUAL SHORTNESS OF BREATH . *UNUSUAL BRUISING OR BLEEDING . *URINARY PROBLEMS (pain or burning when urinating, or frequent urination) . *BOWEL PROBLEMS (unusual diarrhea, constipation, pain near the anus) . TENDERNESS IN MOUTH AND THROAT WITH OR WITHOUT PRESENCE OF ULCERS (sore throat, sores in mouth, or a toothache) . UNUSUAL RASH, SWELLING OR PAIN  . UNUSUAL VAGINAL DISCHARGE OR ITCHING   Items with * indicate a potential emergency and should be followed up as soon as possible or go to the Emergency Department if any problems should occur.  Please show the CHEMOTHERAPY ALERT CARD or  IMMUNOTHERAPY ALERT CARD at check-in to the Emergency Department and triage nurse.  Should you have questions after your visit or need to cancel or reschedule your appointment, please contact [facility]COL[facility]  Dept: [phone]  and follow the prompts.  Office hours are 8:00 a.m. to 4:30 p.m. Monday - Friday. Please note that voicemails left after 4:00 p.m. may not be returned until the following business day.  We are closed weekends and major holidays. You have access to a nurse at all times for urgent questions. Please call the main number to the clinic Dept: [phone] and follow the prompts.   For any non-urgent questions, you may also contact your provider using MyChart. We now offer e-Visits for anyone 18 and older to request care online for non-urgent symptoms. For details visit [email or web address].   Also download the MyChart app! Go to the app store, search "MyChart", open the app, select [facility], and log in with your MyChart username and password.  Due to Covid, a mask is required upon entering the hospital/clinic. If you do not have a mask, one will be given to you upon arrival. For doctor visits, patients may have 1 support person aged 18 or older with them. For treatment visits, patients cannot have anyone with them due to current Covid guidelines and our immunocompromised population.   

## 2020-11-04 ENCOUNTER — Encounter: Payer: Self-pay | Admitting: Hematology and Oncology

## 2020-11-04 ENCOUNTER — Telehealth: Payer: Self-pay | Admitting: *Deleted

## 2020-11-04 NOTE — Progress Notes (Signed)
Called pt to introduce myself as her Arboriculturist, discuss copay assistance and the [employer].  Pt gave me consent to apply in her behalf so I completed the online app for [employer].  I will notify the pt of the outcome once I receive it.  I also informed her of the [employer], went over what it covers and gave her the income requirement.  Pt exceeds the limit for the [employer] grant so she doesn't qualify at this time.  I will give her my card at her next visit for any questions or concerns she may have in the future.

## 2020-11-04 NOTE — Telephone Encounter (Signed)
DCP-001: Called patient to follow up on her interest in the DCP study. Patient states she is still interested and willing to discuss with research nurse when she comes in for injection appointment this Friday. Thanked patient for her time and look forward to meeting with her on Friday.  Foye Spurling, BSN, RN Clinical Research Nurse 11/04/2020 3:16 PM

## 2020-11-04 NOTE — Progress Notes (Signed)
Pt is enrolled in the [employer] program for [employer] for $15,000 for 12 months from 11/04/20.  Pt is eligible to have $0 OOP costs for each [employer].

## 2020-11-05 ENCOUNTER — Encounter (HOSPITAL_COMMUNITY): Payer: Self-pay | Admitting: General Surgery

## 2020-11-06 ENCOUNTER — Encounter: Payer: Self-pay | Admitting: *Deleted

## 2020-11-06 ENCOUNTER — Inpatient Hospital Stay: Payer: No Typology Code available for payment source

## 2020-11-06 ENCOUNTER — Other Ambulatory Visit: Payer: Self-pay

## 2020-11-06 VITALS — BP 125/74 | HR 81 | Temp 97.8°F | Resp 18

## 2020-11-06 DIAGNOSIS — C50412 Malignant neoplasm of upper-outer quadrant of left female breast: Secondary | ICD-10-CM

## 2020-11-06 DIAGNOSIS — Z170 Estrogen receptor positive status [ER+]: Secondary | ICD-10-CM

## 2020-11-06 DIAGNOSIS — Z5111 Encounter for antineoplastic chemotherapy: Secondary | ICD-10-CM | POA: Diagnosis not present

## 2020-11-06 MED ORDER — PEGFILGRASTIM-CBQV 6 MG/0.6ML ~~LOC~~ SOSY
6.0000 mg | PREFILLED_SYRINGE | Freq: Once | SUBCUTANEOUS | Status: AC
  Administered 2020-11-06: 6 mg via SUBCUTANEOUS
  Filled 2020-11-06: qty 0.6

## 2020-11-06 NOTE — Research (Signed)
Trial Name:  [id number]: Use of a Clinical Trial Screening Tool to Address Cancer Health Disparities in the [place] Program [facility])   Patient Allison Dodson was identified by Dr. Lindi Adie as a potential candidate for the above listed study.  This Clinical Research Nurse met with Allison Dodson, [id number] on 11/06/20 in a manner and location that ensures patient privacy to discuss participation in the above listed research study.  Patient is Accompanied by her mother in law .  Patient was previously provided with informed consent documents.  Patient has not yet read the informed consent documents and so documents were reviewed page by page today.  As outlined in the informed consent form, this Nurse and Pamalee Leyden discussed the purpose of the research study, the investigational nature of the study, study procedures and requirements for study participation, potential risks and benefits of study participation, as well as alternatives to participation.  This study is not blinded or double-blinded. The patient understands participation is voluntary and they may withdraw from study participation at any time.  This study does not involve randomization.  This study does not involve an investigational drug or device. This study does not involve a placebo. Patient understands enrollment is pending full eligibility review.   Confidentiality and how the patient's information will be used as part of study participation were discussed.  Patient was informed there is not reimbursement provided for their time and effort spent on trial participation.      All questions were answered to patient's satisfaction.  The informed consent and separate HIPAA Authorization was reviewed page by page.  The patient's mental and emotional status is appropriate to provide informed consent, and the patient verbalizes an understanding of study participation.  Patient has agreed to participate in the above listed research study  and has voluntarily signed the informed consent PVD 07/10/2018.  and separate HIPAA Authorization, version 5  on 11/06/20 at 11:42AM.  The patient was provided with a copy of the signed informed consent form and separate HIPAA Authorization for their reference.  No study specific procedures were obtained prior to the signing of the informed consent document.  Approximately 15 minutes were spent with the patient reviewing the informed consent documents.  Patient was not requested to complete a Release of Information form.   Patient was then interviewed by this research nurse and answered the study questions without difficulty.  Thanked patient for her time and participation on this study.  Patient meets eligibility and will be enrolled in the DCP-001 study.  Worksheet and original consent given to research assistant to enroll patient on study.  Foye Spurling, BSN, Therapist, sports, [employer] Clinical Research Nurse 11/06/2020

## 2020-11-06 NOTE — Patient Instructions (Signed)

## 2020-11-07 NOTE — Progress Notes (Signed)
Patient Care Team: Tanda Rockers, NP as PCP - General (Nurse Practitioner) Donnelly Angelica, RN as Oncology Nurse Navigator Pershing Proud, RN as Oncology Nurse Navigator Serena Croissant, MD as Consulting Physician (Hematology and Oncology) Emelia Loron, MD as Consulting Physician (General Surgery)  DIAGNOSIS:    ICD-10-CM   1. Malignant neoplasm of upper-outer quadrant of left breast in female, estrogen receptor positive ([facility])  C50.412    Z17.0       SUMMARY OF ONCOLOGIC HISTORY: Oncology History  Malignant neoplasm of upper-outer quadrant of left breast in female, estrogen receptor positive ([facility])  09/25/2020 Initial Diagnosis   Palpable lump in the left breast.  Mammogram revealed 3 cm tumor and a possible 5 mm satellite nodule.  Biopsy revealed grade 3 IDC ER 40% weak, PR 0%, HER2 negative, Ki-67 60%   09/25/2020 Cancer Staging   Staging form: Breast, AJCC 8th Edition - Clinical stage from 09/25/2020: Stage IIB (cT2, cN0, cM0, G3, ER+, PR-, HER2-) - Signed by Serena Croissant, MD on 09/25/2020 Stage prefix: Initial diagnosis Histologic grading system: 3 grade system    10/02/2020 Genetic Testing   Negative hereditary cancer genetic testing: no pathogenic variants detected in [facility] BRCAPlus Panel and [facility] CancerNext-Expanded +RNAinsight Panel.  The report dates are October 02, 2020 and October 13, 2020, respectively.   The BRCAplus panel offered by [employer] and includes sequencing and deletion/duplication analysis for the following 8 genes: ATM, BRCA1, BRCA2, CDH1, CHEK2, PALB2, PTEN, and TP53.  The CancerNext-Expanded gene panel offered by [facility] and includes sequencing, rearrangement, and RNA analysis for the following 77 genes: AIP, ALK, APC, ATM, AXIN2, BAP1, BARD1, BLM, BMPR1A, BRCA1, BRCA2, BRIP1, CDC73, CDH1, CDK4, CDKN1B, CDKN2A, CHEK2, CTNNA1, DICER1, FANCC, FH, FLCN, GALNT12, KIF1B, LZTR1, MAX, MEN1, MET, MLH1, MSH2, MSH3, MSH6, MUTYH, NBN, NF1, NF2,  NTHL1, PALB2, PHOX2B, PMS2, POT1, PRKAR1A, PTCH1, PTEN, RAD51C, RAD51D, RB1, RECQL, RET, SDHA, SDHAF2, SDHB, SDHC, SDHD, SMAD4, SMARCA4, SMARCB1, SMARCE1, STK11, SUFU, TMEM127, TP53, TSC1, TSC2, VHL and XRCC2 (sequencing and deletion/duplication); EGFR, EGLN1, HOXB13, KIT, MITF, PDGFRA, POLD1, and POLE (sequencing only); EPCAM and GREM1 (deletion/duplication only).    11/03/2020 -  Chemotherapy   Patient is on Treatment Plan : BREAST ADJUVANT DOSE DENSE AC q14d / PACLitaxel q7d       CHIEF COMPLIANT: Cycle 1 day 8 dose dense Adriamycin and Cytoxan, toxicity evaluation  INTERVAL HISTORY: Allison Dodson is a 25 y.o. with above-mentioned history of left breast cancer, currently undergoing dose dense Adriamycin and Cytoxan followed by Taxol. She presents to the clinic today for a toxicity check.  She reports having severe nausea on the day of treatment.  It slowly improved over the next couple of days.  She was also constipated and had to take Colace which finally resolved her constipation.  She had a weird taste for water where everything was tasting sweet but now she is doing much better.  Has not had any nausea or vomiting over the past 3 to 4 days.  Did not have any mouth sores.  Denies any fevers or chills.  ALLERGIES:  has No Known Allergies.  MEDICATIONS:  Current Outpatient Medications  Medication Sig Dispense Refill   ALPRAZolam (XANAX) 0.25 MG tablet Take by mouth.     dexamethasone (DECADRON) 4 MG tablet Take 1 tablet (4 mg total) by mouth daily. Take 1 tab day after chemo and 1 tab 2 days after chemo with food. 8 tablet 1   lidocaine-prilocaine (EMLA) cream Apply to affected  area once 30 g 3   LORazepam (ATIVAN) 0.5 MG tablet Take 1 tablet (0.5 mg total) by mouth at bedtime as needed (Nausea or vomiting). 30 tablet 0   norethindrone-ethinyl estradiol (JUNEL 1/20) 1-20 MG-MCG tablet Junel FE 1/20 (28) 1 mg-20 mcg (21)/75 mg (7) tablet     ondansetron (ZOFRAN) 8 MG tablet Take 1 tablet (8 mg  total) by mouth 2 (two) times daily as needed. Start on the third day after chemotherapy. 30 tablet 1   prochlorperazine (COMPAZINE) 10 MG tablet Take 1 tablet (10 mg total) by mouth every 6 (six) hours as needed (Nausea or vomiting). 30 tablet 1   traMADol (ULTRAM) 50 MG tablet Take 2 tablets (100 mg total) by mouth every 6 (six) hours as needed. 8 tablet 0   No current facility-administered medications for this visit.    PHYSICAL EXAMINATION: ECOG PERFORMANCE STATUS: 1 - Symptomatic but completely ambulatory  Vitals:   11/09/20 0951  BP: 113/84  Pulse: 99  Resp: 18  Temp: 97.7 F (36.5 C)  SpO2: 100%   Filed Weights   11/09/20 0951  Weight: 218 lb 3.2 oz (99 kg)     LABORATORY DATA:  I have reviewed the data as listed CMP Latest Ref Rng & Units 11/03/2020  Glucose 70 - 99 mg/dL 93  BUN 6 - 20 mg/dL 10  Creatinine 0.44 - 1.00 mg/dL 0.65  Sodium 135 - 145 mmol/L 140  Potassium 3.5 - 5.1 mmol/L 3.6  Chloride 98 - 111 mmol/L 107  CO2 22 - 32 mmol/L 24  Calcium 8.9 - 10.3 mg/dL 9.1  Total Protein 6.5 - 8.1 g/dL 7.0  Total Bilirubin 0.3 - 1.2 mg/dL 0.3  Alkaline Phos 38 - 126 U/L 43  AST 15 - 41 U/L 11(L)  ALT 0 - 44 U/L 13    Lab Results  Component Value Date   WBC 8.9 11/03/2020   HGB 11.6 (L) 11/03/2020   HCT 34.5 (L) 11/03/2020   MCV 84.4 11/03/2020   PLT 263 11/03/2020   NEUTROABS 5.2 11/03/2020    ASSESSMENT & PLAN:  Malignant neoplasm of upper-outer quadrant of left breast in female, estrogen receptor positive ([place]) 09/22/2020: Palpable mass in the left breast corresponding to suspicious mass on mammogram measuring 3 cm, possible 5 mm satellite nodule, left axilla negative, right breast negative ER 40% weak, PR negative, HER2 negative, Ki-67 60%   Breast MRI 09/28/2020: 3.7 cm enhancing mass at 3 o'clock position left breast with a possible 7 mm satellite nodule, no abnormal lymph nodes.   Treatment plan: 1.  Neoadjuvant chemotherapy with dose dense  Adriamycin and Cytoxan followed by Taxol weekly x12 2. followed by mastectomy (patient's preference) 2. adjuvant radiation therapy (she will not need radiation if she has a mastectomy) 3.  Followed by adjuvant antiestrogen therapy with ovarian function suppression and aromatase inhibitor therapy Patient had her wedding on 10/22/2020 ---------------------------------------------------------------------------------------------------------------------------------------- Current treatment: Cycle 1 day 8 dose dense Adriamycin and Cytoxan Echocardiogram 10/13/2020: EF 60 to 65% Chemo toxicities: Nausea and vomiting: On the day of chemo.  It could be related to the anesthesia from the port placement. Fatigue  Monitoring closely for toxicities. We are getting a chest x-ray today for verifying port line placement Return to clinic in 1 week for cycle 2    No orders of the defined types were placed in this encounter.  The patient has a good understanding of the overall plan. she agrees with it. she will call with any problems  that may develop before the next visit here.  Total time spent: 20 mins including face to face time and time spent for planning, charting and coordination of care  Rulon Eisenmenger, MD, MPH 11/09/2020  I, Thana Ates, am acting as scribe for Dr. Nicholas Lose.  I have reviewed the above documentation for accuracy and completeness, and I agree with the above.

## 2020-11-09 ENCOUNTER — Other Ambulatory Visit: Payer: Self-pay

## 2020-11-09 ENCOUNTER — Inpatient Hospital Stay: Payer: No Typology Code available for payment source

## 2020-11-09 ENCOUNTER — Encounter: Payer: Self-pay | Admitting: *Deleted

## 2020-11-09 ENCOUNTER — Inpatient Hospital Stay (HOSPITAL_BASED_OUTPATIENT_CLINIC_OR_DEPARTMENT_OTHER): Payer: No Typology Code available for payment source | Admitting: Hematology and Oncology

## 2020-11-09 ENCOUNTER — Ambulatory Visit (HOSPITAL_COMMUNITY)
Admission: RE | Admit: 2020-11-09 | Discharge: 2020-11-09 | Disposition: A | Discharge status: Home / Self Care | Payer: No Typology Code available for payment source | Source: Ambulatory Visit | Attending: Hematology and Oncology | Admitting: Hematology and Oncology

## 2020-11-09 ENCOUNTER — Other Ambulatory Visit: Payer: Self-pay | Admitting: *Deleted

## 2020-11-09 DIAGNOSIS — Z170 Estrogen receptor positive status [ER+]: Secondary | ICD-10-CM

## 2020-11-09 DIAGNOSIS — Z95828 Presence of other vascular implants and grafts: Secondary | ICD-10-CM

## 2020-11-09 DIAGNOSIS — C50412 Malignant neoplasm of upper-outer quadrant of left female breast: Secondary | ICD-10-CM

## 2020-11-09 DIAGNOSIS — Z5111 Encounter for antineoplastic chemotherapy: Secondary | ICD-10-CM | POA: Diagnosis not present

## 2020-11-09 LAB — CMP (CANCER CENTER ONLY)
ALT: 19 U/L (ref 0–44)
AST: 13 U/L — ABNORMAL LOW (ref 15–41)
Albumin: 3.8 g/dL (ref 3.5–5.0)
Alkaline Phosphatase: 62 U/L (ref 38–126)
Anion gap: 7 (ref 5–15)
BUN: 9 mg/dL (ref 6–20)
CO2: 26 mmol/L (ref 22–32)
Calcium: 8.8 mg/dL — ABNORMAL LOW (ref 8.9–10.3)
Chloride: 104 mmol/L (ref 98–111)
Creatinine: 0.42 mg/dL — ABNORMAL LOW (ref 0.44–1.00)
GFR, Estimated: >60 mL/min (ref >60)
Glucose, Bld: 89 mg/dL (ref 70–99)
Potassium: 3.9 mmol/L (ref 3.5–5.1)
Sodium: 137 mmol/L (ref 135–145)
Total Bilirubin: 0.5 mg/dL (ref 0.3–1.2)
Total Protein: 6.9 g/dL (ref 6.5–8.1)

## 2020-11-09 LAB — CBC WITH DIFFERENTIAL (CANCER CENTER ONLY)
Abs Immature Granulocytes: 0 10*3/uL (ref 0.00–0.07)
Band Neutrophils: 6 %
Basophils Absolute: 0.1 10*3/uL (ref 0.0–0.1)
Basophils Relative: 1 %
Eosinophils Absolute: 0 10*3/uL (ref 0.0–0.5)
Eosinophils Relative: 0 %
HCT: 34.2 % — ABNORMAL LOW (ref 36.0–46.0)
Hemoglobin: 11.6 g/dL — ABNORMAL LOW (ref 12.0–15.0)
Lymphocytes Relative: 16 %
Lymphs Abs: 1.3 10*3/uL (ref 0.7–4.0)
MCH: 28.9 pg (ref 26.0–34.0)
MCHC: 33.9 g/dL (ref 30.0–36.0)
MCV: 85.1 fL (ref 80.0–100.0)
Monocytes Absolute: 0.1 10*3/uL (ref 0.1–1.0)
Monocytes Relative: 1 %
Neutro Abs: 6.8 10*3/uL (ref 1.7–7.7)
Neutrophils Relative %: 76 %
Platelet Count: 159 10*3/uL (ref 150–400)
RBC: 4.02 MIL/uL (ref 3.87–5.11)
RDW: 12.5 % (ref 11.5–15.5)
WBC Count: 8.3 10*3/uL (ref 4.0–10.5)
nRBC: 0 % (ref 0.0–0.2)

## 2020-11-09 MED ORDER — HEPARIN SOD (PORK) LOCK FLUSH 100 UNIT/ML IV SOLN
500.0000 [IU] | Freq: Once | INTRAVENOUS | Status: AC
  Administered 2020-11-09: 500 [IU] via INTRAVENOUS

## 2020-11-09 MED ORDER — SODIUM CHLORIDE 0.9% FLUSH
10.0000 mL | Freq: Once | INTRAVENOUS | Status: AC
  Administered 2020-11-09: 10 mL via INTRAVENOUS

## 2020-11-09 NOTE — Assessment & Plan Note (Signed)
09/22/2020: Palpable mass in the left breast corresponding to suspicious mass on mammogram measuring 3 cm, possible 5 mm satellite nodule, left axilla negative, right breast negative ER 40% weak, PR negative, HER2 negative, Ki-67 60%  Breast MRI 09/28/2020: 3.7 cm enhancing mass at 3 o'clock position left breast with a possible 7 mm satellite nodule, no abnormal lymph nodes.  Treatment plan: 1.  Neoadjuvant chemotherapy with dose dense Adriamycin and Cytoxan followed by Taxol weekly x12 2. followed by mastectomy (patient's preference) 2. adjuvant radiation therapy (she will not need radiation if she has a mastectomy) 3.  Followed by adjuvant antiestrogen therapy with ovarian function suppression and aromatase inhibitor therapy Patient had her wedding on 10/22/2020 ---------------------------------------------------------------------------------------------------------------------------------------- Current treatment: Cycle 1 day 8 dose dense Adriamycin and Cytoxan Echocardiogram 10/13/2020: EF 60 to 65% Chemo toxicities:  Return to clinic in 1 week for cycle 2

## 2020-11-09 NOTE — Patient Instructions (Signed)

## 2020-11-09 NOTE — Progress Notes (Signed)
Pt needing chest xray for port a cath tip placement.  Verbal orders received by MD.

## 2020-11-16 MED FILL — Dexamethasone Sodium Phosphate Inj 100 MG/10ML: INTRAMUSCULAR | Qty: 1 | Status: AC

## 2020-11-16 MED FILL — Fosaprepitant Dimeglumine For IV Infusion 150 MG (Base Eq): INTRAVENOUS | Qty: 5 | Status: AC

## 2020-11-16 NOTE — Progress Notes (Signed)
 Patient Care Team: Chrzanowski, Jami B, NP as PCP - General (Nurse Practitioner) Martini, Keisha N, RN as Oncology Nurse Navigator Stuart, Dawn C, RN as Oncology Nurse Navigator Gudena, Vinay, MD as Consulting Physician (Hematology and Oncology) Wakefield, Matthew, MD as Consulting Physician (General Surgery)  DIAGNOSIS:    ICD-10-CM   1. Malignant neoplasm of upper-outer quadrant of left breast in female, estrogen receptor positive (HCC)  C50.412    Z17.0       SUMMARY OF ONCOLOGIC HISTORY: Oncology History  Malignant neoplasm of upper-outer quadrant of left breast in female, estrogen receptor positive (HCC)  09/25/2020 Initial Diagnosis   Palpable lump in the left breast.  Mammogram revealed 3 cm tumor and a possible 5 mm satellite nodule.  Biopsy revealed grade 3 IDC ER 40% weak, PR 0%, HER2 negative, Ki-67 60%   09/25/2020 Cancer Staging   Staging form: Breast, AJCC 8th Edition - Clinical stage from 09/25/2020: Stage IIB (cT2, cN0, cM0, G3, ER+, PR-, HER2-) - Signed by Gudena, Vinay, MD on 09/25/2020 Stage prefix: Initial diagnosis Histologic grading system: 3 grade system    10/02/2020 Genetic Testing   Negative hereditary cancer genetic testing: no pathogenic variants detected in [facility] BRCAPlus Panel and [facility] CancerNext-Expanded +RNAinsight Panel.  The report dates are October 02, 2020 and October 13, 2020, respectively.   The BRCAplus panel offered by [facility] and includes sequencing and deletion/duplication analysis for the following 8 genes: ATM, BRCA1, BRCA2, CDH1, CHEK2, PALB2, PTEN, and TP53.  The CancerNext-Expanded gene panel offered by [facility] and includes sequencing, rearrangement, and RNA analysis for the following 77 genes: AIP, ALK, APC, ATM, AXIN2, BAP1, BARD1, BLM, BMPR1A, BRCA1, BRCA2, BRIP1, CDC73, CDH1, CDK4, CDKN1B, CDKN2A, CHEK2, CTNNA1, DICER1, FANCC, FH, FLCN, GALNT12, KIF1B, LZTR1, MAX, MEN1, MET, MLH1, MSH2, MSH3, MSH6, MUTYH, NBN, NF1, NF2,  NTHL1, PALB2, PHOX2B, PMS2, POT1, PRKAR1A, PTCH1, PTEN, RAD51C, RAD51D, RB1, RECQL, RET, SDHA, SDHAF2, SDHB, SDHC, SDHD, SMAD4, SMARCA4, SMARCB1, SMARCE1, STK11, SUFU, TMEM127, TP53, TSC1, TSC2, VHL and XRCC2 (sequencing and deletion/duplication); EGFR, EGLN1, HOXB13, KIT, MITF, PDGFRA, POLD1, and POLE (sequencing only); EPCAM and GREM1 (deletion/duplication only).    11/03/2020 -  Chemotherapy   Patient is on Treatment Plan : BREAST ADJUVANT DOSE DENSE AC q14d / PACLitaxel q7d       CHIEF COMPLIANT: Cycle 2 dose dense Adriamycin and Cytoxan, toxicity evaluation  INTERVAL HISTORY: Allison Dodson is a 25 y.o. with above-mentioned history of left breast cancer, currently undergoing dose dense Adriamycin and Cytoxan followed by Taxol. She presents to the clinic today for a toxicity check and treatment.   ALLERGIES:  has No Known Allergies.  MEDICATIONS:  Current Outpatient Medications  Medication Sig Dispense Refill   ALPRAZolam (XANAX) 0.25 MG tablet Take by mouth.     lidocaine-prilocaine (EMLA) cream Apply to affected area once 30 g 3   LORazepam (ATIVAN) 0.5 MG tablet Take 1 tablet (0.5 mg total) by mouth at bedtime as needed (Nausea or vomiting). 30 tablet 0   ondansetron (ZOFRAN) 8 MG tablet Take 1 tablet (8 mg total) by mouth 2 (two) times daily as needed. Start on the third day after chemotherapy. 30 tablet 1   prochlorperazine (COMPAZINE) 10 MG tablet Take 1 tablet (10 mg total) by mouth every 6 (six) hours as needed (Nausea or vomiting). 30 tablet 1   No current facility-administered medications for this visit.    PHYSICAL EXAMINATION: ECOG PERFORMANCE STATUS: 1 - Symptomatic but completely ambulatory  Vitals:   11/17/20 0937    BP: 130/74  Pulse: (!) 108  Resp: 18  Temp: 97.7 F (36.5 C)  SpO2: 100%   Filed Weights   11/17/20 0937  Weight: 218 lb 3.2 oz (99 kg)    LABORATORY DATA:  I have reviewed the data as listed CMP Latest Ref Rng & Units 11/09/2020 11/03/2020   Glucose 70 - 99 mg/dL 89 93  BUN 6 - 20 mg/dL 9 10  Creatinine 0.44 - 1.00 mg/dL 0.42(L) 0.65  Sodium 135 - 145 mmol/L 137 140  Potassium 3.5 - 5.1 mmol/L 3.9 3.6  Chloride 98 - 111 mmol/L 104 107  CO2 22 - 32 mmol/L 26 24  Calcium 8.9 - 10.3 mg/dL 8.8(L) 9.1  Total Protein 6.5 - 8.1 g/dL 6.9 7.0  Total Bilirubin 0.3 - 1.2 mg/dL 0.5 0.3  Alkaline Phos 38 - 126 U/L 62 43  AST 15 - 41 U/L 13(L) 11(L)  ALT 0 - 44 U/L 19 13    Lab Results  Component Value Date   WBC 5.7 11/17/2020   HGB 11.8 (L) 11/17/2020   HCT 35.8 (L) 11/17/2020   MCV 84.0 11/17/2020   PLT 155 11/17/2020   NEUTROABS 3.4 11/17/2020    ASSESSMENT & PLAN:  Malignant neoplasm of upper-outer quadrant of left breast in female, estrogen receptor positive (HCC) 09/22/2020: Palpable mass in the left breast corresponding to suspicious mass on mammogram measuring 3 cm, possible 5 mm satellite nodule, left axilla negative, right breast negative ER 40% weak, PR negative, HER2 negative, Ki-67 60%   Breast MRI 09/28/2020: 3.7 cm enhancing mass at 3 o'clock position left breast with a possible 7 mm satellite nodule, no abnormal lymph nodes.   Treatment plan: 1.  Neoadjuvant chemotherapy with dose dense Adriamycin and Cytoxan followed by Taxol weekly x12 2. followed by mastectomy (patient's preference) 2. adjuvant radiation therapy (she will not need radiation if she has a mastectomy) 3.  Followed by adjuvant antiestrogen therapy with ovarian function suppression and aromatase inhibitor therapy Patient had her wedding on 10/22/2020 ---------------------------------------------------------------------------------------------------------------------------------------- Current treatment: Cycle 2 dose dense Adriamycin and Cytoxan Echocardiogram 10/13/2020: EF 60 to 65% Chemo toxicities: Nausea and vomiting: On the day of chemo.  It could be related to the anesthesia from the port placement. Fatigue 3.  Dexamethasone related  adverse effects: We will discontinue oral Dex.  She will receive IV dexamethasone today and infusion.  If she does well we will discontinue dexamethasone from IV treatment as well.  Monitoring closely for toxicities.   Return to clinic in 2 weeks for cycle 3    No orders of the defined types were placed in this encounter.  The patient has a good understanding of the overall plan. she agrees with it. she will call with any problems that may develop before the next visit here.  Total time spent: 30 mins including face to face time and time spent for planning, charting and coordination of care  Vinay K Gudena, MD, MPH 11/17/2020  I, Kirstyn Evans, am acting as scribe for Dr. Vinay Gudena.  I have reviewed the above documentation for accuracy and completeness, and I agree with the above.       

## 2020-11-16 NOTE — Assessment & Plan Note (Signed)
09/22/2020: Palpable mass in the left breast corresponding to suspicious mass on mammogram measuring 3 cm, possible 5 mm satellite nodule, left axilla negative, right breast negative ER 40% weak, PR negative, HER2 negative, Ki-67 60%  Breast MRI 09/28/2020: 3.7 cm enhancing mass at 3 o'clock position left breast with a possible 7 mm satellite nodule, no abnormal lymph nodes.  Treatment plan: 1.Neoadjuvant chemotherapy with dose dense Adriamycin and Cytoxan followed by Taxol weekly x12 2.followed by mastectomy (patient's preference) 2.adjuvant radiation therapy(she will not need radiation if she has a mastectomy) 3.Followed by adjuvant antiestrogen therapy with ovarian function suppression and aromatase inhibitor therapy Patient had her wedding on 10/22/2020 ---------------------------------------------------------------------------------------------------------------------------------------- Current treatment: Cycle 2 dose dense Adriamycin and Cytoxan Echocardiogram 10/13/2020: EF 60 to 65% Chemo toxicities: 1. Nausea and vomiting: On the day of chemo.  It could be related to the anesthesia from the port placement. 2. Fatigue  Monitoring closely for toxicities. We are getting a chest x-ray today for verifying port line placement Return to clinic in 2 weeks for cycle 3

## 2020-11-17 ENCOUNTER — Inpatient Hospital Stay: Payer: No Typology Code available for payment source

## 2020-11-17 ENCOUNTER — Inpatient Hospital Stay (HOSPITAL_BASED_OUTPATIENT_CLINIC_OR_DEPARTMENT_OTHER): Payer: No Typology Code available for payment source | Admitting: Hematology and Oncology

## 2020-11-17 ENCOUNTER — Other Ambulatory Visit: Payer: Self-pay

## 2020-11-17 ENCOUNTER — Inpatient Hospital Stay: Payer: No Typology Code available for payment source | Attending: Hematology and Oncology

## 2020-11-17 VITALS — HR 96

## 2020-11-17 DIAGNOSIS — Z170 Estrogen receptor positive status [ER+]: Secondary | ICD-10-CM | POA: Diagnosis not present

## 2020-11-17 DIAGNOSIS — Z5111 Encounter for antineoplastic chemotherapy: Secondary | ICD-10-CM | POA: Insufficient documentation

## 2020-11-17 DIAGNOSIS — C50412 Malignant neoplasm of upper-outer quadrant of left female breast: Secondary | ICD-10-CM

## 2020-11-17 DIAGNOSIS — T451X5A Adverse effect of antineoplastic and immunosuppressive drugs, initial encounter: Secondary | ICD-10-CM | POA: Insufficient documentation

## 2020-11-17 DIAGNOSIS — R5383 Other fatigue: Secondary | ICD-10-CM | POA: Insufficient documentation

## 2020-11-17 DIAGNOSIS — R112 Nausea with vomiting, unspecified: Secondary | ICD-10-CM | POA: Diagnosis not present

## 2020-11-17 DIAGNOSIS — Z5189 Encounter for other specified aftercare: Secondary | ICD-10-CM | POA: Insufficient documentation

## 2020-11-17 DIAGNOSIS — Z95828 Presence of other vascular implants and grafts: Secondary | ICD-10-CM

## 2020-11-17 DIAGNOSIS — Z79899 Other long term (current) drug therapy: Secondary | ICD-10-CM | POA: Diagnosis not present

## 2020-11-17 LAB — CMP (CANCER CENTER ONLY)
ALT: 27 U/L (ref 0–44)
AST: 15 U/L (ref 15–41)
Albumin: 3.7 g/dL (ref 3.5–5.0)
Alkaline Phosphatase: 63 U/L (ref 38–126)
Anion gap: 8 (ref 5–15)
BUN: 8 mg/dL (ref 6–20)
CO2: 24 mmol/L (ref 22–32)
Calcium: 9 mg/dL (ref 8.9–10.3)
Chloride: 107 mmol/L (ref 98–111)
Creatinine: 0.65 mg/dL (ref 0.44–1.00)
GFR, Estimated: >60 mL/min (ref >60)
Glucose, Bld: 115 mg/dL — ABNORMAL HIGH (ref 70–99)
Potassium: 3.8 mmol/L (ref 3.5–5.1)
Sodium: 139 mmol/L (ref 135–145)
Total Bilirubin: 0.3 mg/dL (ref 0.3–1.2)
Total Protein: 7 g/dL (ref 6.5–8.1)

## 2020-11-17 LAB — CBC WITH DIFFERENTIAL (CANCER CENTER ONLY)
Abs Immature Granulocytes: 0.49 10*3/uL — ABNORMAL HIGH (ref 0.00–0.07)
Basophils Absolute: 0 10*3/uL (ref 0.0–0.1)
Basophils Relative: 0 %
Eosinophils Absolute: 0 10*3/uL (ref 0.0–0.5)
Eosinophils Relative: 0 %
HCT: 35.8 % — ABNORMAL LOW (ref 36.0–46.0)
Hemoglobin: 11.8 g/dL — ABNORMAL LOW (ref 12.0–15.0)
Immature Granulocytes: 9 %
Lymphocytes Relative: 26 %
Lymphs Abs: 1.5 10*3/uL (ref 0.7–4.0)
MCH: 27.7 pg (ref 26.0–34.0)
MCHC: 33 g/dL (ref 30.0–36.0)
MCV: 84 fL (ref 80.0–100.0)
Monocytes Absolute: 0.3 10*3/uL (ref 0.1–1.0)
Monocytes Relative: 5 %
Neutro Abs: 3.4 10*3/uL (ref 1.7–7.7)
Neutrophils Relative %: 60 %
Platelet Count: 155 10*3/uL (ref 150–400)
RBC: 4.26 MIL/uL (ref 3.87–5.11)
RDW: 13.1 % (ref 11.5–15.5)
WBC Count: 5.7 10*3/uL (ref 4.0–10.5)
nRBC: 0.4 % — ABNORMAL HIGH (ref 0.0–0.2)

## 2020-11-17 MED ORDER — SODIUM CHLORIDE 0.9 % IV SOLN: Freq: Once | INTRAVENOUS | Status: AC

## 2020-11-17 MED ORDER — SODIUM CHLORIDE 0.9 % IV SOLN
10.0000 mg | Freq: Once | INTRAVENOUS | Status: AC
  Administered 2020-11-17: 10 mg via INTRAVENOUS
  Filled 2020-11-17: qty 10

## 2020-11-17 MED ORDER — SODIUM CHLORIDE 0.9% FLUSH
10.0000 mL | INTRAVENOUS | Status: DC | PRN
  Administered 2020-11-17: 10 mL

## 2020-11-17 MED ORDER — SODIUM CHLORIDE 0.9 % IV SOLN
600.0000 mg/m2 | Freq: Once | INTRAVENOUS | Status: AC
  Administered 2020-11-17: 1200 mg via INTRAVENOUS
  Filled 2020-11-17: qty 60

## 2020-11-17 MED ORDER — SODIUM CHLORIDE 0.9% FLUSH
10.0000 mL | INTRAVENOUS | Status: DC | PRN
  Administered 2020-11-17: 10 mL via INTRAVENOUS

## 2020-11-17 MED ORDER — DOXORUBICIN HCL CHEMO IV INJECTION 2 MG/ML
60.0000 mg/m2 | Freq: Once | INTRAVENOUS | Status: AC
  Administered 2020-11-17: 120 mg via INTRAVENOUS
  Filled 2020-11-17: qty 60

## 2020-11-17 MED ORDER — PALONOSETRON HCL INJECTION 0.25 MG/5ML
0.2500 mg | Freq: Once | INTRAVENOUS | Status: AC
  Administered 2020-11-17: 0.25 mg via INTRAVENOUS
  Filled 2020-11-17: qty 5

## 2020-11-17 MED ORDER — HEPARIN SOD (PORK) LOCK FLUSH 100 UNIT/ML IV SOLN
500.0000 [IU] | Freq: Once | INTRAVENOUS | Status: AC | PRN
  Administered 2020-11-17: 500 [IU]

## 2020-11-17 MED ORDER — SODIUM CHLORIDE 0.9 % IV SOLN
150.0000 mg | Freq: Once | INTRAVENOUS | Status: AC
  Administered 2020-11-17: 150 mg via INTRAVENOUS
  Filled 2020-11-17: qty 150

## 2020-11-17 NOTE — Patient Instructions (Signed)
[facility]  Discharge Instructions: Thank you for choosing [facility] to provide your oncology and hematology care.   If you have a lab appointment with the [facility], please go directly to the [facility] and check in at the registration area.   Wear comfortable clothing and clothing appropriate for easy access to any Portacath or PICC line.   We strive to give you quality time with your provider. You may need to reschedule your appointment if you arrive late (15 or more minutes).  Arriving late affects you and other patients whose appointments are after yours.  Also, if you miss three or more appointments without notifying the office, you may be dismissed from the clinic at the provider's discretion.      For prescription refill requests, have your pharmacy contact our office and allow 72 hours for refills to be completed.    Today you received the following chemotherapy and/or immunotherapy agents: Doxorubicin (Adriamycin) and Cytoxan.     To help prevent nausea and vomiting after your treatment, we encourage you to take your nausea medication as directed.  BELOW ARE SYMPTOMS THAT SHOULD BE REPORTED IMMEDIATELY: *FEVER GREATER THAN 100.4 F (38 C) OR HIGHER *CHILLS OR SWEATING *NAUSEA AND VOMITING THAT IS NOT CONTROLLED WITH YOUR NAUSEA MEDICATION *UNUSUAL SHORTNESS OF BREATH *UNUSUAL BRUISING OR BLEEDING *URINARY PROBLEMS (pain or burning when urinating, or frequent urination) *BOWEL PROBLEMS (unusual diarrhea, constipation, pain near the anus) TENDERNESS IN MOUTH AND THROAT WITH OR WITHOUT PRESENCE OF ULCERS (sore throat, sores in mouth, or a toothache) UNUSUAL RASH, SWELLING OR PAIN  UNUSUAL VAGINAL DISCHARGE OR ITCHING   Items with * indicate a potential emergency and should be followed up as soon as possible or go to the Emergency Department if any problems should occur.  Please show the CHEMOTHERAPY ALERT CARD or IMMUNOTHERAPY  ALERT CARD at check-in to the Emergency Department and triage nurse.  Should you have questions after your visit or need to cancel or reschedule your appointment, please contact [facility]  Dept: [phone]  and follow the prompts.  Office hours are 8:00 a.m. to 4:30 p.m. Monday - Friday. Please note that voicemails left after 4:00 p.m. may not be returned until the following business day.  We are closed weekends and major holidays. You have access to a nurse at all times for urgent questions. Please call the main number to the clinic Dept: [phone] and follow the prompts.   For any non-urgent questions, you may also contact your provider using MyChart. We now offer e-Visits for anyone 11 and older to request care online for non-urgent symptoms. For details visit [email or web address].   Also download the MyChart app! Go to the app store, search "MyChart", open the app, select [facility], and log in with your MyChart username and password.  Due to Covid, a mask is required upon entering the hospital/clinic. If you do not have a mask, one will be given to you upon arrival. For doctor visits, patients may have 1 support person aged 62 or older with them. For treatment visits, patients cannot have anyone with them due to current Covid guidelines and our immunocompromised population.

## 2020-11-20 ENCOUNTER — Inpatient Hospital Stay: Payer: No Typology Code available for payment source

## 2020-11-20 ENCOUNTER — Telehealth: Payer: Self-pay | Admitting: *Deleted

## 2020-11-20 ENCOUNTER — Telehealth: Payer: Self-pay

## 2020-11-20 ENCOUNTER — Other Ambulatory Visit: Payer: Self-pay

## 2020-11-20 ENCOUNTER — Encounter: Payer: Self-pay | Admitting: Hematology and Oncology

## 2020-11-20 VITALS — BP 119/79 | HR 126 | Temp 98.6°F | Resp 18

## 2020-11-20 DIAGNOSIS — Z5111 Encounter for antineoplastic chemotherapy: Secondary | ICD-10-CM | POA: Diagnosis not present

## 2020-11-20 DIAGNOSIS — C50412 Malignant neoplasm of upper-outer quadrant of left female breast: Secondary | ICD-10-CM

## 2020-11-20 MED ORDER — PEGFILGRASTIM-CBQV 6 MG/0.6ML ~~LOC~~ SOSY
6.0000 mg | PREFILLED_SYRINGE | Freq: Once | SUBCUTANEOUS | Status: AC
  Administered 2020-11-20: 6 mg via SUBCUTANEOUS
  Filled 2020-11-20: qty 0.6

## 2020-11-20 NOTE — Telephone Encounter (Signed)
Was alerted by flush nurse that pt hr was 126 and increasing. Went to evaluate pt. She stated that she was anxious about getting the injection even though she had gotten it before. Other vitals were wnl.

## 2020-11-20 NOTE — Telephone Encounter (Signed)
Patient notified of completion of Disability and [facility] Provider Statement. Request for medical records forwarded to [facility] [employer] with signed Release of Information form. Copy placed at Registration Desk for pick up as requested.

## 2020-11-30 MED FILL — Dexamethasone Sodium Phosphate Inj 100 MG/10ML: INTRAMUSCULAR | Qty: 1 | Status: AC

## 2020-11-30 MED FILL — Fosaprepitant Dimeglumine For IV Infusion 150 MG (Base Eq): INTRAVENOUS | Qty: 5 | Status: AC

## 2020-11-30 NOTE — Progress Notes (Signed)
Patient Care Team: Tanda Rockers, NP as PCP - General (Nurse Practitioner) Donnelly Angelica, RN as Oncology Nurse Navigator Pershing Proud, RN as Oncology Nurse Navigator Serena Croissant, MD as Consulting Physician (Hematology and Oncology) Emelia Loron, MD as Consulting Physician (General Surgery)  DIAGNOSIS:    ICD-10-CM   1. Malignant neoplasm of upper-outer quadrant of left breast in female, estrogen receptor positive (HCC)  C50.412    Z17.0       SUMMARY OF ONCOLOGIC HISTORY: Oncology History  Malignant neoplasm of upper-outer quadrant of left breast in female, estrogen receptor positive (HCC)  09/25/2020 Initial Diagnosis   Palpable lump in the left breast.  Mammogram revealed 3 cm tumor and a possible 5 mm satellite nodule.  Biopsy revealed grade 3 IDC ER 40% weak, PR 0%, HER2 negative, Ki-67 60%   09/25/2020 Cancer Staging   Staging form: Breast, AJCC 8th Edition - Clinical stage from 09/25/2020: Stage IIB (cT2, cN0, cM0, G3, ER+, PR-, HER2-) - Signed by Serena Croissant, MD on 09/25/2020 Stage prefix: Initial diagnosis Histologic grading system: 3 grade system    10/02/2020 Genetic Testing   Negative hereditary cancer genetic testing: no pathogenic variants detected in [facility] BRCAPlus Panel and [facility] CancerNext-Expanded +RNAinsight Panel.  The report dates are October 02, 2020 and October 13, 2020, respectively.   The BRCAplus panel offered by [facility] and includes sequencing and deletion/duplication analysis for the following 8 genes: ATM, BRCA1, BRCA2, CDH1, CHEK2, PALB2, PTEN, and TP53.  The CancerNext-Expanded gene panel offered by [facility] and includes sequencing, rearrangement, and RNA analysis for the following 77 genes: AIP, ALK, APC, ATM, AXIN2, BAP1, BARD1, BLM, BMPR1A, BRCA1, BRCA2, BRIP1, CDC73, CDH1, CDK4, CDKN1B, CDKN2A, CHEK2, CTNNA1, DICER1, FANCC, FH, FLCN, GALNT12, KIF1B, LZTR1, MAX, MEN1, MET, MLH1, MSH2, MSH3, MSH6, MUTYH, NBN, NF1, NF2,  NTHL1, PALB2, PHOX2B, PMS2, POT1, PRKAR1A, PTCH1, PTEN, RAD51C, RAD51D, RB1, RECQL, RET, SDHA, SDHAF2, SDHB, SDHC, SDHD, SMAD4, SMARCA4, SMARCB1, SMARCE1, STK11, SUFU, TMEM127, TP53, TSC1, TSC2, VHL and XRCC2 (sequencing and deletion/duplication); EGFR, EGLN1, HOXB13, KIT, MITF, PDGFRA, POLD1, and POLE (sequencing only); EPCAM and GREM1 (deletion/duplication only).    11/03/2020 -  Chemotherapy   Patient is on Treatment Plan : BREAST ADJUVANT DOSE DENSE AC q14d / PACLitaxel q7d       CHIEF COMPLIANT: Cycle 3 dose dense Adriamycin and Cytoxan, toxicity evaluation  INTERVAL HISTORY: Allison Dodson is a 25 y.o. with above-mentioned history of left breast cancer, currently undergoing dose dense Adriamycin and Cytoxan followed by Taxol. She presents to the clinic today for a toxicity check and treatment.  She felt fatigued for the first week after chemo and also felt jittery for 2 to 3 days after chemo.  She thinks that it may be related to low blood sugars.  Did not have any nausea or vomiting.  She does take Compazine as needed.  ALLERGIES:  has No Known Allergies.  MEDICATIONS:  Current Outpatient Medications  Medication Sig Dispense Refill   ALPRAZolam (XANAX) 0.25 MG tablet Take by mouth.     lidocaine-prilocaine (EMLA) cream Apply to affected area once 30 g 3   LORazepam (ATIVAN) 0.5 MG tablet Take 1 tablet (0.5 mg total) by mouth at bedtime as needed (Nausea or vomiting). 30 tablet 0   ondansetron (ZOFRAN) 8 MG tablet Take 1 tablet (8 mg total) by mouth 2 (two) times daily as needed. Start on the third day after chemotherapy. 30 tablet 1   prochlorperazine (COMPAZINE) 10 MG tablet Take 1  tablet (10 mg total) by mouth every 6 (six) hours as needed (Nausea or vomiting). 30 tablet 1   No current facility-administered medications for this visit.    PHYSICAL EXAMINATION: ECOG PERFORMANCE STATUS: 1 - Symptomatic but completely ambulatory  Vitals:   12/01/20 0932  BP: 131/86  Pulse: 97  Resp:  18  Temp: 97.9 F (36.6 C)  SpO2: 99%   Filed Weights   12/01/20 0932  Weight: 221 lb 4.8 oz (100.4 kg)    LABORATORY DATA:  I have reviewed the data as listed CMP Latest Ref Rng & Units 11/17/2020 11/09/2020 11/03/2020  Glucose 70 - 99 mg/dL 115(H) 89 93  BUN 6 - 20 mg/dL $Remove'8 9 10  'duitwol$ Creatinine 0.44 - 1.00 mg/dL 0.65 0.42(L) 0.65  Sodium 135 - 145 mmol/L 139 137 140  Potassium 3.5 - 5.1 mmol/L 3.8 3.9 3.6  Chloride 98 - 111 mmol/L 107 104 107  CO2 22 - 32 mmol/L $RemoveB'24 26 24  'pcHilrIo$ Calcium 8.9 - 10.3 mg/dL 9.0 8.8(L) 9.1  Total Protein 6.5 - 8.1 g/dL 7.0 6.9 7.0  Total Bilirubin 0.3 - 1.2 mg/dL 0.3 0.5 0.3  Alkaline Phos 38 - 126 U/L 63 62 43  AST 15 - 41 U/L 15 13(L) 11(L)  ALT 0 - 44 U/L $Remo'27 19 13    'qkVDE$ Lab Results  Component Value Date   WBC 5.7 11/17/2020   HGB 11.8 (L) 11/17/2020   HCT 35.8 (L) 11/17/2020   MCV 84.0 11/17/2020   PLT 155 11/17/2020   NEUTROABS 3.4 11/17/2020    ASSESSMENT & PLAN:  Malignant neoplasm of upper-outer quadrant of left breast in female, estrogen receptor positive ([facility]) 09/22/2020: Palpable mass in the left breast corresponding to suspicious mass on mammogram measuring 3 cm, possible 5 mm satellite nodule, left axilla negative, right breast negative ER 40% weak, PR negative, HER2 negative, Ki-67 60%   Breast MRI 09/28/2020: 3.7 cm enhancing mass at 3 o'clock position left breast with a possible 7 mm satellite nodule, no abnormal lymph nodes.   Treatment plan: 1.  Neoadjuvant chemotherapy with dose dense Adriamycin and Cytoxan followed by Taxol weekly x12 2. followed by mastectomy (patient's preference) 2. adjuvant radiation therapy (she will not need radiation if she has a mastectomy) 3.  Followed by adjuvant antiestrogen therapy with ovarian function suppression and aromatase inhibitor therapy Patient had her wedding on  10/22/2020 ---------------------------------------------------------------------------------------------------------------------------------------- Current treatment: Cycle 3 dose dense Adriamycin and Cytoxan Echocardiogram 10/13/2020: EF 60 to 65% Chemo toxicities: Nausea and vomiting: Resolved Fatigue 3.  Dexamethasone related adverse effects: We discontinued dexamethasone because it made her feel jittery.   Monitoring closely for toxicities.   Return to clinic in 2 weeks for cycle 4    No orders of the defined types were placed in this encounter.  The patient has a good understanding of the overall plan. she agrees with it. she will call with any problems that may develop before the next visit here.  Total time spent: 30 mins including face to face time and time spent for planning, charting and coordination of care  Rulon Eisenmenger, MD, MPH 12/01/2020  I, Thana Ates, am acting as scribe for Dr. Nicholas Lose.  I have reviewed the above documentation for accuracy and completeness, and I agree with the above.

## 2020-12-01 ENCOUNTER — Other Ambulatory Visit: Payer: Self-pay

## 2020-12-01 ENCOUNTER — Inpatient Hospital Stay: Payer: No Typology Code available for payment source

## 2020-12-01 ENCOUNTER — Inpatient Hospital Stay (HOSPITAL_BASED_OUTPATIENT_CLINIC_OR_DEPARTMENT_OTHER): Payer: No Typology Code available for payment source | Admitting: Hematology and Oncology

## 2020-12-01 DIAGNOSIS — C50412 Malignant neoplasm of upper-outer quadrant of left female breast: Secondary | ICD-10-CM

## 2020-12-01 DIAGNOSIS — Z95828 Presence of other vascular implants and grafts: Secondary | ICD-10-CM

## 2020-12-01 DIAGNOSIS — Z170 Estrogen receptor positive status [ER+]: Secondary | ICD-10-CM

## 2020-12-01 DIAGNOSIS — Z5111 Encounter for antineoplastic chemotherapy: Secondary | ICD-10-CM | POA: Diagnosis not present

## 2020-12-01 LAB — PREGNANCY, URINE: Preg Test, Ur: NEGATIVE

## 2020-12-01 LAB — CMP (CANCER CENTER ONLY)
ALT: 22 U/L (ref 0–44)
AST: 15 U/L (ref 15–41)
Albumin: 3.6 g/dL (ref 3.5–5.0)
Alkaline Phosphatase: 67 U/L (ref 38–126)
Anion gap: 8 (ref 5–15)
BUN: 7 mg/dL (ref 6–20)
CO2: 25 mmol/L (ref 22–32)
Calcium: 8.9 mg/dL (ref 8.9–10.3)
Chloride: 108 mmol/L (ref 98–111)
Creatinine: 0.59 mg/dL (ref 0.44–1.00)
GFR, Estimated: >60 mL/min (ref >60)
Glucose, Bld: 95 mg/dL (ref 70–99)
Potassium: 4.3 mmol/L (ref 3.5–5.1)
Sodium: 141 mmol/L (ref 135–145)
Total Bilirubin: <0.2 mg/dL — ABNORMAL LOW (ref 0.3–1.2)
Total Protein: 6.6 g/dL (ref 6.5–8.1)

## 2020-12-01 LAB — CBC WITH DIFFERENTIAL (CANCER CENTER ONLY)
Abs Immature Granulocytes: 1.86 10*3/uL — ABNORMAL HIGH (ref 0.00–0.07)
Basophils Absolute: 0 10*3/uL (ref 0.0–0.1)
Basophils Relative: 0 %
Eosinophils Absolute: 0 10*3/uL (ref 0.0–0.5)
Eosinophils Relative: 0 %
HCT: 32.4 % — ABNORMAL LOW (ref 36.0–46.0)
Hemoglobin: 10.9 g/dL — ABNORMAL LOW (ref 12.0–15.0)
Immature Granulocytes: 19 %
Lymphocytes Relative: 14 %
Lymphs Abs: 1.4 10*3/uL (ref 0.7–4.0)
MCH: 28.5 pg (ref 26.0–34.0)
MCHC: 33.6 g/dL (ref 30.0–36.0)
MCV: 84.6 fL (ref 80.0–100.0)
Monocytes Absolute: 0.5 10*3/uL (ref 0.1–1.0)
Monocytes Relative: 5 %
Neutro Abs: 6.1 10*3/uL (ref 1.7–7.7)
Neutrophils Relative %: 62 %
Platelet Count: 137 10*3/uL — ABNORMAL LOW (ref 150–400)
RBC: 3.83 MIL/uL — ABNORMAL LOW (ref 3.87–5.11)
RDW: 14 % (ref 11.5–15.5)
WBC Count: 9.9 10*3/uL (ref 4.0–10.5)
nRBC: 0.4 % — ABNORMAL HIGH (ref 0.0–0.2)

## 2020-12-01 MED ORDER — SODIUM CHLORIDE 0.9% FLUSH
10.0000 mL | INTRAVENOUS | Status: DC | PRN
  Administered 2020-12-01: 10 mL

## 2020-12-01 MED ORDER — DOXORUBICIN HCL CHEMO IV INJECTION 2 MG/ML
60.0000 mg/m2 | Freq: Once | INTRAVENOUS | Status: AC
  Administered 2020-12-01: 120 mg via INTRAVENOUS
  Filled 2020-12-01: qty 60

## 2020-12-01 MED ORDER — SODIUM CHLORIDE 0.9% FLUSH: 10.0000 mL | INTRAVENOUS | Status: DC | PRN

## 2020-12-01 MED ORDER — PALONOSETRON HCL INJECTION 0.25 MG/5ML
0.2500 mg | Freq: Once | INTRAVENOUS | Status: AC
  Administered 2020-12-01: 0.25 mg via INTRAVENOUS
  Filled 2020-12-01: qty 5

## 2020-12-01 MED ORDER — PROCHLORPERAZINE MALEATE 10 MG PO TABS: 10.0000 mg | ORAL_TABLET | Freq: Four times a day (QID) | ORAL | 3 refills | Status: DC | PRN

## 2020-12-01 MED ORDER — SODIUM CHLORIDE 0.9 % IV SOLN: Freq: Once | INTRAVENOUS | Status: AC

## 2020-12-01 MED ORDER — HEPARIN SOD (PORK) LOCK FLUSH 100 UNIT/ML IV SOLN
500.0000 [IU] | Freq: Once | INTRAVENOUS | Status: AC | PRN
  Administered 2020-12-01: 500 [IU]

## 2020-12-01 MED ORDER — SODIUM CHLORIDE 0.9 % IV SOLN
600.0000 mg/m2 | Freq: Once | INTRAVENOUS | Status: AC
  Administered 2020-12-01: 1200 mg via INTRAVENOUS
  Filled 2020-12-01: qty 60

## 2020-12-01 MED ORDER — SODIUM CHLORIDE 0.9 % IV SOLN
150.0000 mg | Freq: Once | INTRAVENOUS | Status: AC
  Administered 2020-12-01: 150 mg via INTRAVENOUS
  Filled 2020-12-01: qty 150

## 2020-12-01 NOTE — Assessment & Plan Note (Signed)
09/22/2020: Palpable mass in the left breast corresponding to suspicious mass on mammogram measuring 3 cm, possible 5 mm satellite nodule, left axilla negative, right breast negative ER 40% weak, PR negative, HER2 negative, Ki-67 60%  Breast MRI 09/28/2020: 3.7 cm enhancing mass at 3 o'clock position left breast with a possible 7 mm satellite nodule, no abnormal lymph nodes.  Treatment plan: 1.Neoadjuvant chemotherapy with dose dense Adriamycin and Cytoxan followed by Taxol weekly x12 2.followed by mastectomy (patient's preference) 2.adjuvant radiation therapy(she will not need radiation if she has a mastectomy) 3.Followed by adjuvant antiestrogen therapy with ovarian function suppression and aromatase inhibitor therapy Patient had her wedding on 10/22/2020 ---------------------------------------------------------------------------------------------------------------------------------------- Current treatment: Cycle 3 dose dense Adriamycin and Cytoxan Echocardiogram 10/13/2020: EF 60 to 65% Chemo toxicities: 1. Nausea and vomiting: On the day of chemo. It could be related to the anesthesia from the port placement. 2. Fatigue 3.  Dexamethasone related adverse effects: We will discontinue oral Dex.  She will receive IV dexamethasone today and infusion.  If she does well we will discontinue dexamethasone from IV treatment as well.  Monitoring closely for toxicities.   Return to clinic in 2 weeks for cycle 4

## 2020-12-01 NOTE — Progress Notes (Signed)
Per lab, ANC is 6.1  Doxorubicin given as ordered.  Blood return noted before, during and after.  Pt denies any discomfort at port.  No s/s of extravasation noted.  Pt tolerated well.

## 2020-12-01 NOTE — Patient Instructions (Signed)
[facility] ONCOLOG[facility]  Discharge Instructions: Thank you for choosing [facility] to provide your oncology and hematology care.   If you have a lab appointment with the [facility], please go directly to the [facility] and check in at the registration area.   Wear comfortable clothing and clothing appropriate for easy access to any Portacath or PICC line.   We strive to give you quality time with your provider. You may need to reschedule your appointment if you arrive late (15 or more minutes).  Arriving late affects you and other patients whose appointments are after yours.  Also, if you miss three or more appointments without notifying the office, you may be dismissed from the clinic at the provider's discretion.      For prescription refill requests, have your pharmacy contact our office and allow 72 hours for refills to be completed.    Today you received the following chemotherapy and/or immunotherapy agents Adriamycin and cytoxan      To help prevent nausea and vomiting after your treatment, we encourage you to take your nausea medication as directed.  BELOW ARE SYMPTOMS THAT SHOULD BE REPORTED IMMEDIATELY: *FEVER GREATER THAN 100.4 F (38 C) OR HIGHER *CHILLS OR SWEATING *NAUSEA AND VOMITING THAT IS NOT CONTROLLED WITH YOUR NAUSEA MEDICATION *UNUSUAL SHORTNESS OF BREATH *UNUSUAL BRUISING OR BLEEDING *URINARY PROBLEMS (pain or burning when urinating, or frequent urination) *BOWEL PROBLEMS (unusual diarrhea, constipation, pain near the anus) TENDERNESS IN MOUTH AND THROAT WITH OR WITHOUT PRESENCE OF ULCERS (sore throat, sores in mouth, or a toothache) UNUSUAL RASH, SWELLING OR PAIN  UNUSUAL VAGINAL DISCHARGE OR ITCHING   Items with * indicate a potential emergency and should be followed up as soon as possible or go to the Emergency Department if any problems should occur.  Please show the CHEMOTHERAPY ALERT CARD or IMMUNOTHERAPY ALERT CARD at  check-in to the Emergency Department and triage nurse.  Should you have questions after your visit or need to cancel or reschedule your appointment, please contact [facility]  Dept: [phone]  and follow the prompts.  Office hours are 8:00 a.m. to 4:30 p.m. Monday - Friday. Please note that voicemails left after 4:00 p.m. may not be returned until the following business day.  We are closed weekends and major holidays. You have access to a nurse at all times for urgent questions. Please call the main number to the clinic Dept: [phone] and follow the prompts.   For any non-urgent questions, you may also contact your provider using MyChart. We now offer e-Visits for anyone 42 and older to request care online for non-urgent symptoms. For details visit [email or web address].   Also download the MyChart app! Go to the app store, search "MyChart", open the app, select [facility], and log in with your MyChart username and password.  Due to Covid, a mask is required upon entering the hospital/clinic. If you do not have a mask, one will be given to you upon arrival. For doctor visits, patients may have 1 support person aged 47 or older with them. For treatment visits, patients cannot have anyone with them due to current Covid guidelines and our immunocompromised population.

## 2020-12-04 ENCOUNTER — Inpatient Hospital Stay: Payer: No Typology Code available for payment source

## 2020-12-04 ENCOUNTER — Other Ambulatory Visit: Payer: Self-pay

## 2020-12-04 VITALS — BP 132/83 | HR 122 | Temp 98.6°F | Resp 18

## 2020-12-04 DIAGNOSIS — Z5111 Encounter for antineoplastic chemotherapy: Secondary | ICD-10-CM | POA: Diagnosis not present

## 2020-12-04 DIAGNOSIS — Z170 Estrogen receptor positive status [ER+]: Secondary | ICD-10-CM

## 2020-12-04 MED ORDER — PEGFILGRASTIM-CBQV 6 MG/0.6ML ~~LOC~~ SOSY
6.0000 mg | PREFILLED_SYRINGE | Freq: Once | SUBCUTANEOUS | Status: AC
  Administered 2020-12-04: 6 mg via SUBCUTANEOUS
  Filled 2020-12-04: qty 0.6

## 2020-12-14 NOTE — Progress Notes (Signed)
Patient Care Team: Kerry Dory, NP as PCP - General (Nurse Practitioner) Rockwell Germany, RN as Oncology Nurse Navigator Mauro Kaufmann, RN as Oncology Nurse Navigator Nicholas Lose, MD as Consulting Physician (Hematology and Oncology) Rolm Bookbinder, MD as Consulting Physician (General Surgery)  DIAGNOSIS:    ICD-10-CM   1. Malignant neoplasm of upper-outer quadrant of left breast in female, estrogen receptor positive ([place])  C50.412    Z17.0       SUMMARY OF ONCOLOGIC HISTORY: Oncology History  Malignant neoplasm of upper-outer quadrant of left breast in female, estrogen receptor positive ([place])  09/25/2020 Initial Diagnosis   Palpable lump in the left breast.  Mammogram revealed 3 cm tumor and a possible 5 mm satellite nodule.  Biopsy revealed grade 3 IDC ER 40% weak, PR 0%, HER2 negative, Ki-67 60%   09/25/2020 Cancer Staging   Staging form: Breast, AJCC 8th Edition - Clinical stage from 09/25/2020: Stage IIB (cT2, cN0, cM0, G3, ER+, PR-, HER2-) - Signed by Nicholas Lose, MD on 09/25/2020 Stage prefix: Initial diagnosis Histologic grading system: 3 grade system    10/02/2020 Genetic Testing   Negative hereditary cancer genetic testing: no pathogenic variants detected in [facility] BRCAPlus Panel and [facility] CancerNext-Expanded +RNAinsight Panel.  The report dates are October 02, 2020 and October 13, 2020, respectively.   The BRCAplus panel offered by [employer] and includes sequencing and deletion/duplication analysis for the following 8 genes: ATM, BRCA1, BRCA2, CDH1, CHEK2, PALB2, PTEN, and TP53.  The CancerNext-Expanded gene panel offered by [facility] and includes sequencing, rearrangement, and RNA analysis for the following 77 genes: AIP, ALK, APC, ATM, AXIN2, BAP1, BARD1, BLM, BMPR1A, BRCA1, BRCA2, BRIP1, CDC73, CDH1, CDK4, CDKN1B, CDKN2A, CHEK2, CTNNA1, DICER1, FANCC, FH, FLCN, GALNT12, KIF1B, LZTR1, MAX, MEN1, MET, MLH1, MSH2, MSH3, MSH6, MUTYH, NBN, NF1, NF2,  NTHL1, PALB2, PHOX2B, PMS2, POT1, PRKAR1A, PTCH1, PTEN, RAD51C, RAD51D, RB1, RECQL, RET, SDHA, SDHAF2, SDHB, SDHC, SDHD, SMAD4, SMARCA4, SMARCB1, SMARCE1, STK11, SUFU, TMEM127, TP53, TSC1, TSC2, VHL and XRCC2 (sequencing and deletion/duplication); EGFR, EGLN1, HOXB13, KIT, MITF, PDGFRA, POLD1, and POLE (sequencing only); EPCAM and GREM1 (deletion/duplication only).    11/03/2020 -  Chemotherapy   Patient is on Treatment Plan : BREAST ADJUVANT DOSE DENSE AC q14d / PACLitaxel q7d       CHIEF COMPLIANT: Cycle 4 dose dense Adriamycin and Cytoxan, toxicity evaluation  INTERVAL HISTORY: Allison Dodson is a 25 y.o. with above-mentioned history of left breast cancer, currently undergoing dose dense Adriamycin and Cytoxan followed by Taxol. She presents to the clinic today for a toxicity check and treatment.  She had more nausea for the first couple of days after the last cycle of chemo.  Taste and appetite are still intact.  She does not want to take any steroids.  ALLERGIES:  has No Known Allergies.  MEDICATIONS:  Current Outpatient Medications  Medication Sig Dispense Refill   ALPRAZolam (XANAX) 0.25 MG tablet Take by mouth.     lidocaine-prilocaine (EMLA) cream Apply to affected area once 30 g 3   LORazepam (ATIVAN) 0.5 MG tablet Take 1 tablet (0.5 mg total) by mouth at bedtime as needed (Nausea or vomiting). 30 tablet 0   ondansetron (ZOFRAN) 8 MG tablet Take 1 tablet (8 mg total) by mouth 2 (two) times daily as needed. Start on the third day after chemotherapy. 30 tablet 1   prochlorperazine (COMPAZINE) 10 MG tablet Take 1 tablet (10 mg total) by mouth every 6 (six) hours as needed (Nausea or vomiting).  30 tablet 3   No current facility-administered medications for this visit.    PHYSICAL EXAMINATION: ECOG PERFORMANCE STATUS: 1 - Symptomatic but completely ambulatory  Vitals:   12/15/20 1011  BP: 130/74  Pulse: (!) 115  Resp: 18  Temp: (!) 97.3 F (36.3 C)  SpO2: 99%   Filed Weights    12/15/20 1011  Weight: 220 lb 4.8 oz (99.9 kg)    LABORATORY DATA:  I have reviewed the data as listed CMP Latest Ref Rng & Units 12/01/2020 11/17/2020 11/09/2020  Glucose 70 - 99 mg/dL 95 115(H) 89  BUN 6 - 20 mg/dL _0 Creatinine 0.44 - 1.00 mg/dL 0.59 0.65 0.42(L)  Sodium 135 - 145 mmol/L 141 139 137  Potassium 3.5 - 5.1 mmol/L 4.3 3.8 3.9  Chloride 98 - 111 mmol/L 108 107 104  CO2 22 - 32 mmol/L _1 Calcium 8.9 - 10.3 mg/dL 8.9 9.0 8.8(L)  Total Protein 6.5 - 8.1 g/dL 6.6 7.0 6.9  Total Bilirubin 0.3 - 1.2 mg/dL <0.2(L) 0.3 0.5  Alkaline Phos 38 - 126 U/L 67 63 62  AST 15 - 41 U/L 15 15 13(L)  ALT 0 - 44 U/L _2 Lab Results  Component Value Date   WBC 10.9 (H) 12/15/2020   HGB 10.5 (L) 12/15/2020   HCT 31.4 (L) 12/15/2020   MCV 85.6 12/15/2020   PLT 182 12/15/2020   NEUTROABS PENDING 12/15/2020    ASSESSMENT & PLAN:  Malignant neoplasm of upper-outer quadrant of left breast in female, estrogen receptor positive ([place]) 09/22/2020: Palpable mass in the left breast corresponding to suspicious mass on mammogram measuring 3 cm, possible 5 mm satellite nodule, left axilla negative, right breast negative ER 40% weak, PR negative, HER2 negative, Ki-67 60%   Breast MRI 09/28/2020: 3.7 cm enhancing mass at 3 o'clock position left breast with a possible 7 mm satellite nodule, no abnormal lymph nodes.   Treatment plan: 1.  Neoadjuvant chemotherapy with dose dense Adriamycin and Cytoxan followed by Taxol weekly x12 2. followed by mastectomy (patient's preference) 2. adjuvant radiation therapy (she will not need radiation if she has a mastectomy) 3.  Followed by adjuvant antiestrogen therapy with ovarian function suppression and aromatase inhibitor therapy Patient had her wedding on 10/22/2020 ---------------------------------------------------------------------------------------------------------------------------------------- Current treatment: Cycle 4 dose dense  Adriamycin and Cytoxan Echocardiogram 10/13/2020: EF 60 to 65% Chemo toxicities: Nausea and vomiting: Resolved Fatigue 3.  Dexamethasone related adverse effects: We discontinued dexamethasone because it made her feel jittery.   Monitoring closely for toxicities.   Return to clinic in 2 weeks for cycle 1 Taxol    No orders of the defined types were placed in this encounter.  The patient has a good understanding of the overall plan. she agrees with it. she will call with any problems that may develop before the next visit here.  Total time spent: 30 mins including face to face time and time spent for planning, charting and coordination of care  Rulon Eisenmenger, MD, MPH 12/15/2020  I, Thana Ates, am acting as scribe for Dr. Nicholas Lose.  I have reviewed the above documentation for accuracy and completeness, and I agree with the above.

## 2020-12-14 NOTE — Assessment & Plan Note (Signed)
09/22/2020: Palpable mass in the left breast corresponding to suspicious mass on mammogram measuring 3 cm, possible 5 mm satellite nodule, left axilla negative, right breast negative ER 40% weak, PR negative, HER2 negative, Ki-67 60%  Breast MRI 09/28/2020: 3.7 cm enhancing mass at 3 o'clock position left breast with a possible 7 mm satellite nodule, no abnormal lymph nodes.  Treatment plan: 1.Neoadjuvant chemotherapy with dose dense Adriamycin and Cytoxan followed by Taxol weekly x12 2.followed by mastectomy (patient's preference) 2.adjuvant radiation therapy(she will not need radiation if she has a mastectomy) 3.Followed by adjuvant antiestrogen therapy with ovarian function suppression and aromatase inhibitor therapy Patient had her wedding on 10/22/2020 ---------------------------------------------------------------------------------------------------------------------------------------- Current treatment: Cycle4 dose dense Adriamycin and Cytoxan Echocardiogram 10/13/2020: EF 60 to 65% Chemo toxicities: 1. Nausea and vomiting: Resolved 2. Fatigue 3.Dexamethasone related adverse effects: We discontinued dexamethasone because it made her feel jittery.  Monitoring closely for toxicities.  Return to clinic in2weeksfor cycle 1 Taxol

## 2020-12-15 ENCOUNTER — Inpatient Hospital Stay: Payer: No Typology Code available for payment source

## 2020-12-15 ENCOUNTER — Other Ambulatory Visit: Payer: Self-pay

## 2020-12-15 ENCOUNTER — Inpatient Hospital Stay (HOSPITAL_BASED_OUTPATIENT_CLINIC_OR_DEPARTMENT_OTHER): Payer: No Typology Code available for payment source | Admitting: Hematology and Oncology

## 2020-12-15 ENCOUNTER — Encounter: Payer: Self-pay | Admitting: *Deleted

## 2020-12-15 VITALS — HR 97

## 2020-12-15 DIAGNOSIS — Z170 Estrogen receptor positive status [ER+]: Secondary | ICD-10-CM | POA: Diagnosis not present

## 2020-12-15 DIAGNOSIS — C50412 Malignant neoplasm of upper-outer quadrant of left female breast: Secondary | ICD-10-CM

## 2020-12-15 DIAGNOSIS — Z95828 Presence of other vascular implants and grafts: Secondary | ICD-10-CM

## 2020-12-15 DIAGNOSIS — Z5111 Encounter for antineoplastic chemotherapy: Secondary | ICD-10-CM | POA: Diagnosis not present

## 2020-12-15 LAB — CBC WITH DIFFERENTIAL (CANCER CENTER ONLY)
Abs Immature Granulocytes: 1.83 10*3/uL — ABNORMAL HIGH (ref 0.00–0.07)
Basophils Absolute: 0 10*3/uL (ref 0.0–0.1)
Basophils Relative: 0 %
Eosinophils Absolute: 0 10*3/uL (ref 0.0–0.5)
Eosinophils Relative: 0 %
HCT: 31.4 % — ABNORMAL LOW (ref 36.0–46.0)
Hemoglobin: 10.5 g/dL — ABNORMAL LOW (ref 12.0–15.0)
Immature Granulocytes: 17 %
Lymphocytes Relative: 10 %
Lymphs Abs: 1.1 10*3/uL (ref 0.7–4.0)
MCH: 28.6 pg (ref 26.0–34.0)
MCHC: 33.4 g/dL (ref 30.0–36.0)
MCV: 85.6 fL (ref 80.0–100.0)
Monocytes Absolute: 0.7 10*3/uL (ref 0.1–1.0)
Monocytes Relative: 6 %
Neutro Abs: 7.3 10*3/uL (ref 1.7–7.7)
Neutrophils Relative %: 67 %
Platelet Count: 182 10*3/uL (ref 150–400)
RBC: 3.67 MIL/uL — ABNORMAL LOW (ref 3.87–5.11)
RDW: 15.8 % — ABNORMAL HIGH (ref 11.5–15.5)
WBC Count: 10.9 10*3/uL — ABNORMAL HIGH (ref 4.0–10.5)
nRBC: 0.6 % — ABNORMAL HIGH (ref 0.0–0.2)

## 2020-12-15 LAB — CMP (CANCER CENTER ONLY)
ALT: 20 U/L (ref 0–44)
AST: 16 U/L (ref 15–41)
Albumin: 3.8 g/dL (ref 3.5–5.0)
Alkaline Phosphatase: 62 U/L (ref 38–126)
Anion gap: 7 (ref 5–15)
BUN: 9 mg/dL (ref 6–20)
CO2: 24 mmol/L (ref 22–32)
Calcium: 8.9 mg/dL (ref 8.9–10.3)
Chloride: 106 mmol/L (ref 98–111)
Creatinine: 0.41 mg/dL — ABNORMAL LOW (ref 0.44–1.00)
GFR, Estimated: >60 mL/min (ref >60)
Glucose, Bld: 94 mg/dL (ref 70–99)
Potassium: 3.7 mmol/L (ref 3.5–5.1)
Sodium: 137 mmol/L (ref 135–145)
Total Bilirubin: 0.6 mg/dL (ref 0.3–1.2)
Total Protein: 6.9 g/dL (ref 6.5–8.1)

## 2020-12-15 LAB — PREGNANCY, URINE: Preg Test, Ur: NEGATIVE

## 2020-12-15 MED ORDER — SODIUM CHLORIDE 0.9 % IV SOLN: Freq: Once | INTRAVENOUS | Status: AC

## 2020-12-15 MED ORDER — SODIUM CHLORIDE 0.9% FLUSH
10.0000 mL | INTRAVENOUS | Status: DC | PRN
  Administered 2020-12-15: 10 mL via INTRAVENOUS

## 2020-12-15 MED ORDER — PALONOSETRON HCL INJECTION 0.25 MG/5ML
0.2500 mg | Freq: Once | INTRAVENOUS | Status: AC
  Administered 2020-12-15: 0.25 mg via INTRAVENOUS

## 2020-12-15 MED ORDER — DOXORUBICIN HCL CHEMO IV INJECTION 2 MG/ML
60.0000 mg/m2 | Freq: Once | INTRAVENOUS | Status: AC
  Administered 2020-12-15: 120 mg via INTRAVENOUS
  Filled 2020-12-15: qty 60

## 2020-12-15 MED ORDER — SODIUM CHLORIDE 0.9 % IV SOLN
600.0000 mg/m2 | Freq: Once | INTRAVENOUS | Status: AC
  Administered 2020-12-15: 1200 mg via INTRAVENOUS
  Filled 2020-12-15: qty 60

## 2020-12-15 MED ORDER — HEPARIN SOD (PORK) LOCK FLUSH 100 UNIT/ML IV SOLN
500.0000 [IU] | Freq: Once | INTRAVENOUS | Status: AC | PRN
  Administered 2020-12-15: 500 [IU]

## 2020-12-15 MED ORDER — SODIUM CHLORIDE 0.9 % IV SOLN
150.0000 mg | Freq: Once | INTRAVENOUS | Status: AC
  Administered 2020-12-15: 150 mg via INTRAVENOUS
  Filled 2020-12-15: qty 150

## 2020-12-15 MED ORDER — SODIUM CHLORIDE 0.9% FLUSH
10.0000 mL | INTRAVENOUS | Status: DC | PRN
  Administered 2020-12-15: 10 mL

## 2020-12-15 NOTE — Patient Instructions (Signed)
[facility]  Discharge Instructions: Thank you for choosing [facility] to provide your oncology and hematology care.   If you have a lab appointment with the [facility], please go directly to the [facility] and check in at the registration area.   Wear comfortable clothing and clothing appropriate for easy access to any Portacath or PICC line.   We strive to give you quality time with your provider. You may need to reschedule your appointment if you arrive late (15 or more minutes).  Arriving late affects you and other patients whose appointments are after yours.  Also, if you miss three or more appointments without notifying the office, you may be dismissed from the clinic at the provider's discretion.      For prescription refill requests, have your pharmacy contact our office and allow 72 hours for refills to be completed.    Today you received the following chemotherapy and/or immunotherapy agents Adriamycin and Cyclophosphamide      To help prevent nausea and vomiting after your treatment, we encourage you to take your nausea medication as directed.  BELOW ARE SYMPTOMS THAT SHOULD BE REPORTED IMMEDIATELY: *FEVER GREATER THAN 100.4 F (38 C) OR HIGHER *CHILLS OR SWEATING *NAUSEA AND VOMITING THAT IS NOT CONTROLLED WITH YOUR NAUSEA MEDICATION *UNUSUAL SHORTNESS OF BREATH *UNUSUAL BRUISING OR BLEEDING *URINARY PROBLEMS (pain or burning when urinating, or frequent urination) *BOWEL PROBLEMS (unusual diarrhea, constipation, pain near the anus) TENDERNESS IN MOUTH AND THROAT WITH OR WITHOUT PRESENCE OF ULCERS (sore throat, sores in mouth, or a toothache) UNUSUAL RASH, SWELLING OR PAIN  UNUSUAL VAGINAL DISCHARGE OR ITCHING   Items with * indicate a potential emergency and should be followed up as soon as possible or go to the Emergency Department if any problems should occur.  Please show the CHEMOTHERAPY ALERT CARD or IMMUNOTHERAPY ALERT  CARD at check-in to the Emergency Department and triage nurse.  Should you have questions after your visit or need to cancel or reschedule your appointment, please contact [facility]  Dept: [phone]  and follow the prompts.  Office hours are 8:00 a.m. to 4:30 p.m. Monday - Friday. Please note that voicemails left after 4:00 p.m. may not be returned until the following business day.  We are closed weekends and major holidays. You have access to a nurse at all times for urgent questions. Please call the main number to the clinic Dept: [phone] and follow the prompts.   For any non-urgent questions, you may also contact your provider using MyChart. We now offer e-Visits for anyone 40 and older to request care online for non-urgent symptoms. For details visit [email or web address].   Also download the MyChart app! Go to the app store, search "MyChart", open the app, select [facility], and log in with your MyChart username and password.  Due to Covid, a mask is required upon entering the hospital/clinic. If you do not have a mask, one will be given to you upon arrival. For doctor visits, patients may have 1 support person aged 82 or older with them. For treatment visits, patients cannot have anyone with them due to current Covid guidelines and our immunocompromised population.

## 2020-12-18 ENCOUNTER — Other Ambulatory Visit: Payer: Self-pay

## 2020-12-18 ENCOUNTER — Inpatient Hospital Stay: Payer: No Typology Code available for payment source | Attending: Hematology and Oncology

## 2020-12-18 ENCOUNTER — Telehealth: Payer: Self-pay | Admitting: Hematology and Oncology

## 2020-12-18 VITALS — BP 130/82 | HR 105 | Temp 97.6°F | Resp 18

## 2020-12-18 DIAGNOSIS — R5383 Other fatigue: Secondary | ICD-10-CM | POA: Insufficient documentation

## 2020-12-18 DIAGNOSIS — Z79899 Other long term (current) drug therapy: Secondary | ICD-10-CM | POA: Insufficient documentation

## 2020-12-18 DIAGNOSIS — C50412 Malignant neoplasm of upper-outer quadrant of left female breast: Secondary | ICD-10-CM | POA: Diagnosis present

## 2020-12-18 DIAGNOSIS — Z5189 Encounter for other specified aftercare: Secondary | ICD-10-CM | POA: Diagnosis not present

## 2020-12-18 DIAGNOSIS — T451X5A Adverse effect of antineoplastic and immunosuppressive drugs, initial encounter: Secondary | ICD-10-CM | POA: Diagnosis not present

## 2020-12-18 DIAGNOSIS — Z170 Estrogen receptor positive status [ER+]: Secondary | ICD-10-CM | POA: Insufficient documentation

## 2020-12-18 DIAGNOSIS — D6481 Anemia due to antineoplastic chemotherapy: Secondary | ICD-10-CM | POA: Insufficient documentation

## 2020-12-18 DIAGNOSIS — Z5111 Encounter for antineoplastic chemotherapy: Secondary | ICD-10-CM | POA: Insufficient documentation

## 2020-12-18 MED ORDER — PEGFILGRASTIM-CBQV 6 MG/0.6ML ~~LOC~~ SOSY
6.0000 mg | PREFILLED_SYRINGE | Freq: Once | SUBCUTANEOUS | Status: AC
  Administered 2020-12-18: 6 mg via SUBCUTANEOUS
  Filled 2020-12-18: qty 0.6

## 2020-12-18 NOTE — Telephone Encounter (Signed)
R/s per 12/2 staff message, left msg

## 2020-12-18 NOTE — Patient Instructions (Signed)

## 2020-12-28 NOTE — Progress Notes (Signed)
Patient Care Team: Allison Dory, NP as PCP - General (Nurse Practitioner) Rockwell Germany, RN as Oncology Nurse Navigator Mauro Kaufmann, RN as Oncology Nurse Navigator Nicholas Lose, MD as Consulting Physician (Hematology and Oncology) Rolm Bookbinder, MD as Consulting Physician (General Surgery)  DIAGNOSIS:    ICD-10-CM   1. Malignant neoplasm of upper-outer quadrant of left breast in female, estrogen receptor positive ([place])  C50.412    Z17.0       SUMMARY OF ONCOLOGIC HISTORY: Oncology History  Malignant neoplasm of upper-outer quadrant of left breast in female, estrogen receptor positive ([place])  09/25/2020 Initial Diagnosis   Palpable lump in the left breast.  Mammogram revealed 3 cm tumor and a possible 5 mm satellite nodule.  Biopsy revealed grade 3 IDC ER 40% weak, PR 0%, HER2 negative, Ki-67 60%   09/25/2020 Cancer Staging   Staging form: Breast, AJCC 8th Edition - Clinical stage from 09/25/2020: Stage IIB (cT2, cN0, cM0, G3, ER+, PR-, HER2-) - Signed by Nicholas Lose, MD on 09/25/2020 Stage prefix: Initial diagnosis Histologic grading system: 3 grade system    10/02/2020 Genetic Testing   Negative hereditary cancer genetic testing: no pathogenic variants detected in [facility] BRCAPlus Panel and [facility] CancerNext-Expanded +RNAinsight Panel.  The report dates are October 02, 2020 and October 13, 2020, respectively.   The BRCAplus panel offered by [employer] and includes sequencing and deletion/duplication analysis for the following 8 genes: ATM, BRCA1, BRCA2, CDH1, CHEK2, PALB2, PTEN, and TP53.  The CancerNext-Expanded gene panel offered by [facility] and includes sequencing, rearrangement, and RNA analysis for the following 77 genes: AIP, ALK, APC, ATM, AXIN2, BAP1, BARD1, BLM, BMPR1A, BRCA1, BRCA2, BRIP1, CDC73, CDH1, CDK4, CDKN1B, CDKN2A, CHEK2, CTNNA1, DICER1, FANCC, FH, FLCN, GALNT12, KIF1B, LZTR1, MAX, MEN1, MET, MLH1, MSH2, MSH3, MSH6, MUTYH, NBN, NF1, NF2,  NTHL1, PALB2, PHOX2B, PMS2, POT1, PRKAR1A, PTCH1, PTEN, RAD51C, RAD51D, RB1, RECQL, RET, SDHA, SDHAF2, SDHB, SDHC, SDHD, SMAD4, SMARCA4, SMARCB1, SMARCE1, STK11, SUFU, TMEM127, TP53, TSC1, TSC2, VHL and XRCC2 (sequencing and deletion/duplication); EGFR, EGLN1, HOXB13, KIT, MITF, PDGFRA, POLD1, and POLE (sequencing only); EPCAM and GREM1 (deletion/duplication only).    11/03/2020 -  Chemotherapy   Patient is on Treatment Plan : BREAST ADJUVANT DOSE DENSE AC q14d / PACLitaxel q7d       CHIEF COMPLIANT: Cycle 1 Taxol  INTERVAL HISTORY: Allison Dodson is a 25 y.o. with above-mentioned history of left breast cancer, currently undergoing dose dense Adriamycin and Cytoxan followed by Taxol. She presents to the clinic today for a toxicity check and treatment.  She is anxious about starting new chemotherapy today.  Overall she feels mildly fatigued.  She is hoping not to have any nausea with this treatment.  ALLERGIES:  has No Known Allergies.  MEDICATIONS:  Current Outpatient Medications  Medication Sig Dispense Refill   ALPRAZolam (XANAX) 0.25 MG tablet Take by mouth.     lidocaine-prilocaine (EMLA) cream Apply to affected area once 30 g 3   LORazepam (ATIVAN) 0.5 MG tablet Take 1 tablet (0.5 mg total) by mouth at bedtime as needed (Nausea or vomiting). 30 tablet 0   ondansetron (ZOFRAN) 8 MG tablet Take 1 tablet (8 mg total) by mouth 2 (two) times daily as needed. Start on the third day after chemotherapy. 30 tablet 1   prochlorperazine (COMPAZINE) 10 MG tablet Take 1 tablet (10 mg total) by mouth every 6 (six) hours as needed (Nausea or vomiting). 30 tablet 3   No current facility-administered medications for this visit.  PHYSICAL EXAMINATION: ECOG PERFORMANCE STATUS: 1 - Symptomatic but completely ambulatory  Vitals:   12/29/20 0933  BP: 138/79  Pulse: (!) 113  Resp: 18  Temp: 97.9 F (36.6 C)  SpO2: 100%   Filed Weights   12/29/20 0933  Weight: 223 lb 12.8 oz (101.5 kg)     LABORATORY DATA:  I have reviewed the data as listed CMP Latest Ref Rng & Units 12/29/2020 12/15/2020 12/01/2020  Glucose 70 - 99 mg/dL 118(H) 94 95  BUN 6 - 20 mg/dL $Remove'8 9 7  'TvrazUR$ Creatinine 0.44 - 1.00 mg/dL 0.57 0.41(L) 0.59  Sodium 135 - 145 mmol/L 140 137 141  Potassium 3.5 - 5.1 mmol/L 3.7 3.7 4.3  Chloride 98 - 111 mmol/L 107 106 108  CO2 22 - 32 mmol/L $RemoveB'25 24 25  'pegqHoAB$ Calcium 8.9 - 10.3 mg/dL 8.6(L) 8.9 8.9  Total Protein 6.5 - 8.1 g/dL 6.4(L) 6.9 6.6  Total Bilirubin 0.3 - 1.2 mg/dL 0.3 0.6 <0.2(L)  Alkaline Phos 38 - 126 U/L 73 62 67  AST 15 - 41 U/L $Remo'18 16 15  'zVWgQ$ ALT 0 - 44 U/L $Remo'21 20 22    'lVowL$ Lab Results  Component Value Date   WBC 11.8 (H) 12/29/2020   HGB 9.9 (L) 12/29/2020   HCT 29.4 (L) 12/29/2020   MCV 86.5 12/29/2020   PLT 166 12/29/2020   NEUTROABS PENDING 12/29/2020    ASSESSMENT & PLAN:  Malignant neoplasm of upper-outer quadrant of left breast in female, estrogen receptor positive ([place]) 09/22/2020: Palpable mass in the left breast corresponding to suspicious mass on mammogram measuring 3 cm, possible 5 mm satellite nodule, left axilla negative, right breast negative ER 40% weak, PR negative, HER2 negative, Ki-67 60%   Breast MRI 09/28/2020: 3.7 cm enhancing mass at 3 o'clock position left breast with a possible 7 mm satellite nodule, no abnormal lymph nodes.   Treatment plan: 1.  Neoadjuvant chemotherapy with dose dense Adriamycin and Cytoxan followed by Taxol weekly x12 2. followed by mastectomy (patient's preference) 2. adjuvant radiation therapy (she will not need radiation if she has a mastectomy) 3.  Followed by adjuvant antiestrogen therapy with ovarian function suppression and aromatase inhibitor therapy Patient had her wedding on 10/22/2020 ---------------------------------------------------------------------------------------------------------------------------------------- Current treatment: Completed 4 cycles of dose dense Adriamycin and Cytoxan, today cycle 1  Taxol Echocardiogram 10/13/2020: EF 60 to 65% Chemo toxicities: Nausea and vomiting: Resolved Fatigue 3.  Dexamethasone related adverse effects: We discontinued dexamethasone because it made her feel jittery.   Monitoring closely for toxicities.   Return to clinic in 1 week for cycle 2 Taxol and toxicity check    No orders of the defined types were placed in this encounter.  The patient has a good understanding of the overall plan. she agrees with it. she will call with any problems that may develop before the next visit here.  Total time spent: 30 mins including face to face time and time spent for planning, charting and coordination of care  Rulon Eisenmenger, MD, MPH 12/29/2020  I, Thana Ates, am acting as scribe for Dr. Nicholas Lose.  I have reviewed the above documentation for accuracy and completeness, and I agree with the above.

## 2020-12-28 NOTE — Assessment & Plan Note (Signed)
09/22/2020: Palpable mass in the left breast corresponding to suspicious mass on mammogram measuring 3 cm, possible 5 mm satellite nodule, left axilla negative, right breast negative ER 40% weak, PR negative, HER2 negative, Ki-67 60%  Breast MRI 09/28/2020: 3.7 cm enhancing mass at 3 o'clock position left breast with a possible 7 mm satellite nodule, no abnormal lymph nodes.  Treatment plan: 1.Neoadjuvant chemotherapy with dose dense Adriamycin and Cytoxan followed by Taxol weekly x12 2.followed by mastectomy (patient's preference) 2.adjuvant radiation therapy(she will not need radiation if she has a mastectomy) 3.Followed by adjuvant antiestrogen therapy with ovarian function suppression and aromatase inhibitor therapy Patient had her wedding on 10/22/2020 ---------------------------------------------------------------------------------------------------------------------------------------- Current treatment: Completed 4 cycles ofdose dense Adriamycin and Cytoxan, today cycle 1 Taxol Echocardiogram 10/13/2020: EF 60 to 65% Chemo toxicities: 1. Nausea and vomiting:Resolved 2. Fatigue 3.Dexamethasone related adverse effects:We discontinued dexamethasone because it made her feel jittery.  Monitoring closely for toxicities.  Return to clinic in1 week for cycle 2 Taxol and toxicity check

## 2020-12-29 ENCOUNTER — Inpatient Hospital Stay: Payer: No Typology Code available for payment source

## 2020-12-29 ENCOUNTER — Inpatient Hospital Stay (HOSPITAL_BASED_OUTPATIENT_CLINIC_OR_DEPARTMENT_OTHER): Payer: No Typology Code available for payment source | Admitting: Hematology and Oncology

## 2020-12-29 ENCOUNTER — Other Ambulatory Visit: Payer: Self-pay

## 2020-12-29 VITALS — BP 119/74 | HR 97 | Temp 98.4°F | Resp 18

## 2020-12-29 DIAGNOSIS — Z95828 Presence of other vascular implants and grafts: Secondary | ICD-10-CM

## 2020-12-29 DIAGNOSIS — Z170 Estrogen receptor positive status [ER+]: Secondary | ICD-10-CM

## 2020-12-29 DIAGNOSIS — C50412 Malignant neoplasm of upper-outer quadrant of left female breast: Secondary | ICD-10-CM

## 2020-12-29 DIAGNOSIS — Z5111 Encounter for antineoplastic chemotherapy: Secondary | ICD-10-CM | POA: Diagnosis not present

## 2020-12-29 HISTORY — DX: Presence of other vascular implants and grafts: Z95.828

## 2020-12-29 LAB — CBC WITH DIFFERENTIAL (CANCER CENTER ONLY)
Abs Immature Granulocytes: 2.37 10*3/uL — ABNORMAL HIGH (ref 0.00–0.07)
Basophils Absolute: 0 10*3/uL (ref 0.0–0.1)
Basophils Relative: 0 %
Eosinophils Absolute: 0 10*3/uL (ref 0.0–0.5)
Eosinophils Relative: 0 %
HCT: 29.4 % — ABNORMAL LOW (ref 36.0–46.0)
Hemoglobin: 9.9 g/dL — ABNORMAL LOW (ref 12.0–15.0)
Immature Granulocytes: 20 %
Lymphocytes Relative: 7 %
Lymphs Abs: 0.9 10*3/uL (ref 0.7–4.0)
MCH: 29.1 pg (ref 26.0–34.0)
MCHC: 33.7 g/dL (ref 30.0–36.0)
MCV: 86.5 fL (ref 80.0–100.0)
Monocytes Absolute: 0.7 10*3/uL (ref 0.1–1.0)
Monocytes Relative: 6 %
Neutro Abs: 7.8 10*3/uL — ABNORMAL HIGH (ref 1.7–7.7)
Neutrophils Relative %: 67 %
Platelet Count: 166 10*3/uL (ref 150–400)
RBC: 3.4 MIL/uL — ABNORMAL LOW (ref 3.87–5.11)
RDW: 17.2 % — ABNORMAL HIGH (ref 11.5–15.5)
Smear Review: NORMAL
WBC Count: 11.8 10*3/uL — ABNORMAL HIGH (ref 4.0–10.5)
nRBC: 0.6 % — ABNORMAL HIGH (ref 0.0–0.2)

## 2020-12-29 LAB — CMP (CANCER CENTER ONLY)
ALT: 21 U/L (ref 0–44)
AST: 18 U/L (ref 15–41)
Albumin: 3.6 g/dL (ref 3.5–5.0)
Alkaline Phosphatase: 73 U/L (ref 38–126)
Anion gap: 8 (ref 5–15)
BUN: 8 mg/dL (ref 6–20)
CO2: 25 mmol/L (ref 22–32)
Calcium: 8.6 mg/dL — ABNORMAL LOW (ref 8.9–10.3)
Chloride: 107 mmol/L (ref 98–111)
Creatinine: 0.57 mg/dL (ref 0.44–1.00)
GFR, Estimated: >60 mL/min (ref >60)
Glucose, Bld: 118 mg/dL — ABNORMAL HIGH (ref 70–99)
Potassium: 3.7 mmol/L (ref 3.5–5.1)
Sodium: 140 mmol/L (ref 135–145)
Total Bilirubin: 0.3 mg/dL (ref 0.3–1.2)
Total Protein: 6.4 g/dL — ABNORMAL LOW (ref 6.5–8.1)

## 2020-12-29 MED ORDER — SODIUM CHLORIDE 0.9 % IV SOLN
80.0000 mg/m2 | Freq: Once | INTRAVENOUS | Status: AC
  Administered 2020-12-29: 162 mg via INTRAVENOUS
  Filled 2020-12-29: qty 27

## 2020-12-29 MED ORDER — SODIUM CHLORIDE 0.9 % IV SOLN: Freq: Once | INTRAVENOUS | Status: AC

## 2020-12-29 MED ORDER — HEPARIN SOD (PORK) LOCK FLUSH 100 UNIT/ML IV SOLN
500.0000 [IU] | Freq: Once | INTRAVENOUS | Status: AC | PRN
  Administered 2020-12-29: 500 [IU]

## 2020-12-29 MED ORDER — SODIUM CHLORIDE 0.9% FLUSH
10.0000 mL | Freq: Once | INTRAVENOUS | Status: AC
  Administered 2020-12-29: 10 mL

## 2020-12-29 MED ORDER — SODIUM CHLORIDE 0.9% FLUSH
10.0000 mL | INTRAVENOUS | Status: DC | PRN
  Administered 2020-12-29: 10 mL

## 2020-12-29 MED ORDER — FAMOTIDINE 20 MG IN NS 100 ML IVPB
20.0000 mg | Freq: Once | INTRAVENOUS | Status: AC
  Administered 2020-12-29: 20 mg via INTRAVENOUS
  Filled 2020-12-29: qty 100

## 2020-12-29 MED ORDER — DIPHENHYDRAMINE HCL 50 MG/ML IJ SOLN
12.5000 mg | Freq: Once | INTRAMUSCULAR | Status: AC
  Administered 2020-12-29: 12.5 mg via INTRAVENOUS
  Filled 2020-12-29: qty 1

## 2020-12-29 NOTE — Patient Instructions (Signed)
[facility] ONCOLOGY  Discharge Instructions: Thank you for choosing [facility] to provide your oncology and hematology care.   If you have a lab appointment with the [facility], please go directly to the [facility] and check in at the registration area.   Wear comfortable clothing and clothing appropriate for easy access to any Portacath or PICC line.   We strive to give you quality time with your provider. You may need to reschedule your appointment if you arrive late (15 or more minutes).  Arriving late affects you and other patients whose appointments are after yours.  Also, if you miss three or more appointments without notifying the office, you may be dismissed from the clinic at the providers discretion.      For prescription refill requests, have your pharmacy contact our office and allow 72 hours for refills to be completed.    Today you received the following chemotherapy and/or immunotherapy agents : Paclitaxel      To help prevent nausea and vomiting after your treatment, we encourage you to take your nausea medication as directed.  BELOW ARE SYMPTOMS THAT SHOULD BE REPORTED IMMEDIATELY: *FEVER GREATER THAN 100.4 F (38 C) OR HIGHER *CHILLS OR SWEATING *NAUSEA AND VOMITING THAT IS NOT CONTROLLED WITH YOUR NAUSEA MEDICATION *UNUSUAL SHORTNESS OF BREATH *UNUSUAL BRUISING OR BLEEDING *URINARY PROBLEMS (pain or burning when urinating, or frequent urination) *BOWEL PROBLEMS (unusual diarrhea, constipation, pain near the anus) TENDERNESS IN MOUTH AND THROAT WITH OR WITHOUT PRESENCE OF ULCERS (sore throat, sores in mouth, or a toothache) UNUSUAL RASH, SWELLING OR PAIN  UNUSUAL VAGINAL DISCHARGE OR ITCHING   Items with * indicate a potential emergency and should be followed up as soon as possible or go to the Emergency Department if any problems should occur.  Please show the CHEMOTHERAPY ALERT CARD or IMMUNOTHERAPY ALERT CARD at check-in  to the Emergency Department and triage nurse.  Should you have questions after your visit or need to cancel or reschedule your appointment, please contact [facility]  Dept: [phone]  and follow the prompts.  Office hours are 8:00 a.m. to 4:30 p.m. Monday - Friday. Please note that voicemails left after 4:00 p.m. may not be returned until the following business day.  We are closed weekends and major holidays. You have access to a nurse at all times for urgent questions. Please call the main number to the clinic Dept: [phone] and follow the prompts.   For any non-urgent questions, you may also contact your provider using MyChart. We now offer e-Visits for anyone 22 and older to request care online for non-urgent symptoms. For details visit [email or web address].   Also download the MyChart app! Go to the app store, search "MyChart", open the app, select [facility], and log in with your MyChart username and password.  Due to Covid, a mask is required upon entering the hospital/clinic. If you do not have a mask, one will be given to you upon arrival. For doctor visits, patients may have 1 support person aged 62 or older with them. For treatment visits, patients cannot have anyone with them due to current Covid guidelines and our immunocompromised population.   Paclitaxel injection What is this medication? PACLITAXEL (PAK li TAX el) is a chemotherapy drug. It targets fast dividing cells, like cancer cells, and causes these cells to die. This medicine is used to treat ovarian cancer, breast cancer, lung cancer, Kaposi's sarcoma, and other cancers. This medicine  may be used for other purposes; ask your health care provider or pharmacist if you have questions. COMMON BRAND NAME(S): Onxol, Taxol What should I tell my care team before I take this medication? They need to know if you have any of these conditions: history of irregular heartbeat liver disease low  blood counts, like low white cell, platelet, or red cell counts lung or breathing disease, like asthma tingling of the fingers or toes, or other nerve disorder an unusual or allergic reaction to paclitaxel, alcohol, polyoxyethylated castor oil, other chemotherapy, other medicines, foods, dyes, or preservatives pregnant or trying to get pregnant breast-feeding How should I use this medication? This drug is given as an infusion into a vein. It is administered in a hospital or clinic by a specially trained health care professional. Talk to your pediatrician regarding the use of this medicine in children. Special care may be needed. Overdosage: If you think you have taken too much of this medicine contact a poison control center or emergency room at once. NOTE: This medicine is only for you. Do not share this medicine with others. What if I miss a dose? It is important not to miss your dose. Call your doctor or health care professional if you are unable to keep an appointment. What may interact with this medication? Do not take this medicine with any of the following medications: live virus vaccines This medicine may also interact with the following medications: antiviral medicines for hepatitis, HIV or AIDS certain antibiotics like erythromycin and clarithromycin certain medicines for fungal infections like ketoconazole and itraconazole certain medicines for seizures like carbamazepine, phenobarbital, phenytoin gemfibrozil nefazodone rifampin St. John's wort This list may not describe all possible interactions. Give your health care provider a list of all the medicines, herbs, non-prescription drugs, or dietary supplements you use. Also tell them if you smoke, drink alcohol, or use illegal drugs. Some items may interact with your medicine. What should I watch for while using this medication? Your condition will be monitored carefully while you are receiving this medicine. You will need  important blood work done while you are taking this medicine. This medicine can cause serious allergic reactions. To reduce your risk you will need to take other medicine(s) before treatment with this medicine. If you experience allergic reactions like skin rash, itching or hives, swelling of the face, lips, or tongue, tell your doctor or health care professional right away. In some cases, you may be given additional medicines to help with side effects. Follow all directions for their use. This drug may make you feel generally unwell. This is not uncommon, as chemotherapy can affect healthy cells as well as cancer cells. Report any side effects. Continue your course of treatment even though you feel ill unless your doctor tells you to stop. Call your doctor or health care professional for advice if you get a fever, chills or sore throat, or other symptoms of a cold or flu. Do not treat yourself. This drug decreases your body's ability to fight infections. Try to avoid being around people who are sick. This medicine may increase your risk to bruise or bleed. Call your doctor or health care professional if you notice any unusual bleeding. Be careful brushing and flossing your teeth or using a toothpick because you may get an infection or bleed more easily. If you have any dental work done, tell your dentist you are receiving this medicine. Avoid taking products that contain aspirin, acetaminophen, ibuprofen, naproxen, or ketoprofen unless instructed by  your doctor. These medicines may hide a fever. Do not become pregnant while taking this medicine. Women should inform their doctor if they wish to become pregnant or think they might be pregnant. There is a potential for serious side effects to an unborn child. Talk to your health care professional or pharmacist for more information. Do not breast-feed an infant while taking this medicine. Men are advised not to father a child while receiving this  medicine. This product may contain alcohol. Ask your pharmacist or healthcare provider if this medicine contains alcohol. Be sure to tell all healthcare providers you are taking this medicine. Certain medicines, like metronidazole and disulfiram, can cause an unpleasant reaction when taken with alcohol. The reaction includes flushing, headache, nausea, vomiting, sweating, and increased thirst. The reaction can last from 30 minutes to several hours. What side effects may I notice from receiving this medication? Side effects that you should report to your doctor or health care professional as soon as possible: allergic reactions like skin rash, itching or hives, swelling of the face, lips, or tongue breathing problems changes in vision fast, irregular heartbeat high or low blood pressure mouth sores pain, tingling, numbness in the hands or feet signs of decreased platelets or bleeding - bruising, pinpoint red spots on the skin, black, tarry stools, blood in the urine signs of decreased red blood cells - unusually weak or tired, feeling faint or lightheaded, falls signs of infection - fever or chills, cough, sore throat, pain or difficulty passing urine signs and symptoms of liver injury like dark yellow or brown urine; general ill feeling or flu-like symptoms; light-colored stools; loss of appetite; nausea; right upper belly pain; unusually weak or tired; yellowing of the eyes or skin swelling of the ankles, feet, hands unusually slow heartbeat Side effects that usually do not require medical attention (report to your doctor or health care professional if they continue or are bothersome): diarrhea hair loss loss of appetite muscle or joint pain nausea, vomiting pain, redness, or irritation at site where injected tiredness This list may not describe all possible side effects. Call your doctor for medical advice about side effects. You may report side effects to FDA at 1-800-FDA-1088. Where  should I keep my medication? This drug is given in a hospital or clinic and will not be stored at home. NOTE: This sheet is a summary. It may not cover all possible information. If you have questions about this medicine, talk to your doctor, pharmacist, or health care provider.  2022 Elsevier/Gold Standard (2020-09-22 00:00:00)

## 2021-01-04 ENCOUNTER — Encounter: Payer: Self-pay | Admitting: *Deleted

## 2021-01-04 NOTE — Progress Notes (Signed)
Patient Care Team: Kerry Dory, NP as PCP - General (Nurse Practitioner) Rockwell Germany, RN as Oncology Nurse Navigator Mauro Kaufmann, RN as Oncology Nurse Navigator Nicholas Lose, MD as Consulting Physician (Hematology and Oncology) Rolm Bookbinder, MD as Consulting Physician (General Surgery)  DIAGNOSIS:    ICD-10-CM   1. Malignant neoplasm of upper-outer quadrant of left breast in female, estrogen receptor positive ([place])  C50.412    Z17.0       SUMMARY OF ONCOLOGIC HISTORY: Oncology History  Malignant neoplasm of upper-outer quadrant of left breast in female, estrogen receptor positive ([facility])  09/25/2020 Initial Diagnosis   Palpable lump in the left breast.  Mammogram revealed 3 cm tumor and a possible 5 mm satellite nodule.  Biopsy revealed grade 3 IDC ER 40% weak, PR 0%, HER2 negative, Ki-67 60%   09/25/2020 Cancer Staging   Staging form: Breast, AJCC 8th Edition - Clinical stage from 09/25/2020: Stage IIB (cT2, cN0, cM0, G3, ER+, PR-, HER2-) - Signed by Nicholas Lose, MD on 09/25/2020 Stage prefix: Initial diagnosis Histologic grading system: 3 grade system    10/02/2020 Genetic Testing   Negative hereditary cancer genetic testing: no pathogenic variants detected in [facility] BRCAPlus Panel and [facility] CancerNext-Expanded +RNAinsight Panel.  The report dates are October 02, 2020 and October 13, 2020, respectively.   The BRCAplus panel offered by [facility] and includes sequencing and deletion/duplication analysis for the following 8 genes: ATM, BRCA1, BRCA2, CDH1, CHEK2, PALB2, PTEN, and TP53.  The CancerNext-Expanded gene panel offered by [facility] and includes sequencing, rearrangement, and RNA analysis for the following 77 genes: AIP, ALK, APC, ATM, AXIN2, BAP1, BARD1, BLM, BMPR1A, BRCA1, BRCA2, BRIP1, CDC73, CDH1, CDK4, CDKN1B, CDKN2A, CHEK2, CTNNA1, DICER1, FANCC, FH, FLCN, GALNT12, KIF1B, LZTR1, MAX, MEN1, MET, MLH1, MSH2, MSH3, MSH6, MUTYH, NBN, NF1, NF2,  NTHL1, PALB2, PHOX2B, PMS2, POT1, PRKAR1A, PTCH1, PTEN, RAD51C, RAD51D, RB1, RECQL, RET, SDHA, SDHAF2, SDHB, SDHC, SDHD, SMAD4, SMARCA4, SMARCB1, SMARCE1, STK11, SUFU, TMEM127, TP53, TSC1, TSC2, VHL and XRCC2 (sequencing and deletion/duplication); EGFR, EGLN1, HOXB13, KIT, MITF, PDGFRA, POLD1, and POLE (sequencing only); EPCAM and GREM1 (deletion/duplication only).    11/03/2020 -  Chemotherapy   Patient is on Treatment Plan : BREAST ADJUVANT DOSE DENSE AC q14d / PACLitaxel q7d       CHIEF COMPLIANT: Cycle 2 Taxol  INTERVAL HISTORY: Allison Dodson is a 25 y.o. with above-mentioned history of breast cancer, currently undergoing dose dense Adriamycin and Cytoxan followed by Taxol. She presents to the clinic today for a toxicity check and treatment.  She did extremely well from Taxol standpoint.  She slept the day of treatment but after that she had excellent energy levels.  Did not have any nausea vomiting diarrhea or constipation.  ALLERGIES:  has No Known Allergies.  MEDICATIONS:  Current Outpatient Medications  Medication Sig Dispense Refill   ALPRAZolam (XANAX) 0.25 MG tablet Take by mouth.     lidocaine-prilocaine (EMLA) cream Apply to affected area once 30 g 3   No current facility-administered medications for this visit.    PHYSICAL EXAMINATION: ECOG PERFORMANCE STATUS: 1 - Symptomatic but completely ambulatory  Vitals:   01/05/21 0959  BP: 135/83  Pulse: (!) 117  Resp: 18  Temp: (!) 97.5 F (36.4 C)  SpO2: 100%   Filed Weights   01/05/21 0959  Weight: 223 lb 9.6 oz (101.4 kg)    LABORATORY DATA:  I have reviewed the data as listed CMP Latest Ref Rng & Units 01/05/2021 12/29/2020 12/15/2020  Glucose 70 - 99 mg/dL 112(H) 118(H) 94  BUN 6 - 20 mg/dL $Remove'8 8 9  'vXVrSPI$ Creatinine 0.44 - 1.00 mg/dL 0.40(L) 0.57 0.41(L)  Sodium 135 - 145 mmol/L 140 140 137  Potassium 3.5 - 5.1 mmol/L 3.7 3.7 3.7  Chloride 98 - 111 mmol/L 107 107 106  CO2 22 - 32 mmol/L $RemoveB'26 25 24  'dfEbkIWQ$ Calcium 8.9 - 10.3  mg/dL 9.0 8.6(L) 8.9  Total Protein 6.5 - 8.1 g/dL 6.4(L) 6.4(L) 6.9  Total Bilirubin 0.3 - 1.2 mg/dL 0.3 0.3 0.6  Alkaline Phos 38 - 126 U/L 59 73 62  AST 15 - 41 U/L $Remo'23 18 16  'BQoqJ$ ALT 0 - 44 U/L 44 21 20    Lab Results  Component Value Date   WBC 4.0 01/05/2021   HGB 8.8 (L) 01/05/2021   HCT 26.1 (L) 01/05/2021   MCV 86.1 01/05/2021   PLT 283 01/05/2021   NEUTROABS 3.0 01/05/2021    ASSESSMENT & PLAN:  Malignant neoplasm of upper-outer quadrant of left breast in female, estrogen receptor positive ([place]) 09/22/2020: Palpable mass in the left breast corresponding to suspicious mass on mammogram measuring 3 cm, possible 5 mm satellite nodule, left axilla negative, right breast negative ER 40% weak, PR negative, HER2 negative, Ki-67 60%   Breast MRI 09/28/2020: 3.7 cm enhancing mass at 3 o'clock position left breast with a possible 7 mm satellite nodule, no abnormal lymph nodes.   Treatment plan: 1.  Neoadjuvant chemotherapy with dose dense Adriamycin and Cytoxan followed by Taxol weekly x12 2. followed by mastectomy (patient's preference) 2. adjuvant radiation therapy (she will not need radiation if she has a mastectomy) 3.  Followed by adjuvant antiestrogen therapy with ovarian function suppression and aromatase inhibitor therapy Patient had her wedding on 10/22/2020 ---------------------------------------------------------------------------------------------------------------------------------------- Current treatment: Completed 4 cycles of dose dense Adriamycin and Cytoxan, today cycle 2 Taxol Echocardiogram 10/13/2020: EF 60 to 65% Chemo toxicities: Denies any nausea or vomiting Fatigue: Mild Chemotherapy-induced anemia: Monitoring closely today's hemoglobin is 8.8. I expect her hemoglobin to slowly improve with time.     Monitoring closely for toxicities.   Return to clinic weekly for Taxol and every other week for follow-up with me      No orders of the defined types were  placed in this encounter.  The patient has a good understanding of the overall plan. she agrees with it. she will call with any problems that may develop before the next visit here.  Total time spent: 30 mins including face to face time and time spent for planning, charting and coordination of care  Rulon Eisenmenger, MD, MPH 01/05/2021  I, Thana Ates, am acting as scribe for Dr. Nicholas Lose.  I have reviewed the above documentation for accuracy and completeness, and I agree with the above.

## 2021-01-05 ENCOUNTER — Other Ambulatory Visit: Payer: Self-pay

## 2021-01-05 ENCOUNTER — Inpatient Hospital Stay (HOSPITAL_BASED_OUTPATIENT_CLINIC_OR_DEPARTMENT_OTHER): Payer: No Typology Code available for payment source | Admitting: Hematology and Oncology

## 2021-01-05 ENCOUNTER — Inpatient Hospital Stay: Payer: No Typology Code available for payment source

## 2021-01-05 VITALS — BP 141/78 | HR 104 | Resp 16

## 2021-01-05 DIAGNOSIS — Z95828 Presence of other vascular implants and grafts: Secondary | ICD-10-CM

## 2021-01-05 DIAGNOSIS — Z170 Estrogen receptor positive status [ER+]: Secondary | ICD-10-CM

## 2021-01-05 DIAGNOSIS — C50412 Malignant neoplasm of upper-outer quadrant of left female breast: Secondary | ICD-10-CM

## 2021-01-05 DIAGNOSIS — Z5111 Encounter for antineoplastic chemotherapy: Secondary | ICD-10-CM | POA: Diagnosis not present

## 2021-01-05 LAB — CMP (CANCER CENTER ONLY)
ALT: 44 U/L (ref 0–44)
AST: 23 U/L (ref 15–41)
Albumin: 3.8 g/dL (ref 3.5–5.0)
Alkaline Phosphatase: 59 U/L (ref 38–126)
Anion gap: 7 (ref 5–15)
BUN: 8 mg/dL (ref 6–20)
CO2: 26 mmol/L (ref 22–32)
Calcium: 9 mg/dL (ref 8.9–10.3)
Chloride: 107 mmol/L (ref 98–111)
Creatinine: 0.4 mg/dL — ABNORMAL LOW (ref 0.44–1.00)
GFR, Estimated: >60 mL/min (ref >60)
Glucose, Bld: 112 mg/dL — ABNORMAL HIGH (ref 70–99)
Potassium: 3.7 mmol/L (ref 3.5–5.1)
Sodium: 140 mmol/L (ref 135–145)
Total Bilirubin: 0.3 mg/dL (ref 0.3–1.2)
Total Protein: 6.4 g/dL — ABNORMAL LOW (ref 6.5–8.1)

## 2021-01-05 LAB — CBC WITH DIFFERENTIAL (CANCER CENTER ONLY)
Abs Immature Granulocytes: 0.04 10*3/uL (ref 0.00–0.07)
Basophils Absolute: 0 10*3/uL (ref 0.0–0.1)
Basophils Relative: 1 %
Eosinophils Absolute: 0 10*3/uL (ref 0.0–0.5)
Eosinophils Relative: 1 %
HCT: 26.1 % — ABNORMAL LOW (ref 36.0–46.0)
Hemoglobin: 8.8 g/dL — ABNORMAL LOW (ref 12.0–15.0)
Immature Granulocytes: 1 %
Lymphocytes Relative: 16 %
Lymphs Abs: 0.7 10*3/uL (ref 0.7–4.0)
MCH: 29 pg (ref 26.0–34.0)
MCHC: 33.7 g/dL (ref 30.0–36.0)
MCV: 86.1 fL (ref 80.0–100.0)
Monocytes Absolute: 0.3 10*3/uL (ref 0.1–1.0)
Monocytes Relative: 8 %
Neutro Abs: 3 10*3/uL (ref 1.7–7.7)
Neutrophils Relative %: 73 %
Platelet Count: 283 10*3/uL (ref 150–400)
RBC: 3.03 MIL/uL — ABNORMAL LOW (ref 3.87–5.11)
RDW: 16.8 % — ABNORMAL HIGH (ref 11.5–15.5)
WBC Count: 4 10*3/uL (ref 4.0–10.5)
nRBC: 0 % (ref 0.0–0.2)

## 2021-01-05 MED ORDER — DIPHENHYDRAMINE HCL 50 MG/ML IJ SOLN
12.5000 mg | Freq: Once | INTRAMUSCULAR | Status: AC
Start: 2021-01-05 — End: 2021-01-05
  Administered 2021-01-05: 11:00:00 12.5 mg via INTRAVENOUS
  Filled 2021-01-05: qty 1

## 2021-01-05 MED ORDER — SODIUM CHLORIDE 0.9 % IV SOLN
80.0000 mg/m2 | Freq: Once | INTRAVENOUS | Status: AC
  Administered 2021-01-05: 12:00:00 162 mg via INTRAVENOUS
  Filled 2021-01-05: qty 27

## 2021-01-05 MED ORDER — SODIUM CHLORIDE 0.9 % IV SOLN: Freq: Once | INTRAVENOUS | Status: AC

## 2021-01-05 MED ORDER — HEPARIN SOD (PORK) LOCK FLUSH 100 UNIT/ML IV SOLN
500.0000 [IU] | Freq: Once | INTRAVENOUS | Status: AC | PRN
  Administered 2021-01-05: 13:00:00 500 [IU]

## 2021-01-05 MED ORDER — SODIUM CHLORIDE 0.9% FLUSH: 10.0000 mL | INTRAVENOUS | Status: DC | PRN

## 2021-01-05 MED ORDER — SODIUM CHLORIDE 0.9% FLUSH
10.0000 mL | Freq: Once | INTRAVENOUS | Status: AC
Start: 2021-01-05 — End: 2021-01-05
  Administered 2021-01-05: 10:00:00 10 mL

## 2021-01-05 MED ORDER — FAMOTIDINE 20 MG IN NS 100 ML IVPB
20.0000 mg | Freq: Once | INTRAVENOUS | Status: AC
  Administered 2021-01-05: 11:00:00 20 mg via INTRAVENOUS
  Filled 2021-01-05: qty 100

## 2021-01-05 NOTE — Patient Instructions (Signed)
[facility] ONCOLOGY  Discharge Instructions: Thank you for choosing [facility] to provide your oncology and hematology care.   If you have a lab appointment with the [facility], please go directly to the [facility] and check in at the registration area.   Wear comfortable clothing and clothing appropriate for easy access to any Portacath or PICC line.   We strive to give you quality time with your provider. You may need to reschedule your appointment if you arrive late (15 or more minutes).  Arriving late affects you and other patients whose appointments are after yours.  Also, if you miss three or more appointments without notifying the office, you may be dismissed from the clinic at the providers discretion.      For prescription refill requests, have your pharmacy contact our office and allow 72 hours for refills to be completed.    Today you received the following chemotherapy Taxol   To help prevent nausea and vomiting after your treatment, we encourage you to take your nausea medication as directed.  BELOW ARE SYMPTOMS THAT SHOULD BE REPORTED IMMEDIATELY: *FEVER GREATER THAN 100.4 F (38 C) OR HIGHER *CHILLS OR SWEATING *NAUSEA AND VOMITING THAT IS NOT CONTROLLED WITH YOUR NAUSEA MEDICATION *UNUSUAL SHORTNESS OF BREATH *UNUSUAL BRUISING OR BLEEDING *URINARY PROBLEMS (pain or burning when urinating, or frequent urination) *BOWEL PROBLEMS (unusual diarrhea, constipation, pain near the anus) TENDERNESS IN MOUTH AND THROAT WITH OR WITHOUT PRESENCE OF ULCERS (sore throat, sores in mouth, or a toothache) UNUSUAL RASH, SWELLING OR PAIN  UNUSUAL VAGINAL DISCHARGE OR ITCHING   Items with * indicate a potential emergency and should be followed up as soon as possible or go to the Emergency Department if any problems should occur.  Please show the CHEMOTHERAPY ALERT CARD or IMMUNOTHERAPY ALERT CARD at check-in to the Emergency Department and triage  nurse.  Should you have questions after your visit or need to cancel or reschedule your appointment, please contact [facility]  Dept: [phone]  and follow the prompts.  Office hours are 8:00 a.m. to 4:30 p.m. Monday - Friday. Please note that voicemails left after 4:00 p.m. may not be returned until the following business day.  We are closed weekends and major holidays. You have access to a nurse at all times for urgent questions. Please call the main number to the clinic Dept: [phone] and follow the prompts.   For any non-urgent questions, you may also contact your provider using MyChart. We now offer e-Visits for anyone 51 and older to request care online for non-urgent symptoms. For details visit [email or web address].   Also download the MyChart app! Go to the app store, search "MyChart", open the app, select [facility], and log in with your MyChart username and password.  Due to Covid, a mask is required upon entering the hospital/clinic. If you do not have a mask, one will be given to you upon arrival. For doctor visits, patients may have 1 support person aged 62 or older with them. For treatment visits, patients cannot have anyone with them due to current Covid guidelines and our immunocompromised population.

## 2021-01-05 NOTE — Progress Notes (Signed)
Treatment given per orders. Patient tolerated it well without problems. Vitals stable and discharged home from clinic ambulatory. Follow up as scheduled.  

## 2021-01-05 NOTE — Assessment & Plan Note (Signed)
09/22/2020: Palpable mass in the left breast corresponding to suspicious mass on mammogram measuring 3 cm, possible 5 mm satellite nodule, left axilla negative, right breast negative ER 40% weak, PR negative, HER2 negative, Ki-67 60%  Breast MRI 09/28/2020: 3.7 cm enhancing mass at 3 o'clock position left breast with a possible 7 mm satellite nodule, no abnormal lymph nodes.  Treatment plan: 1.Neoadjuvant chemotherapy with dose dense Adriamycin and Cytoxan followed by Taxol weekly x12 2.followed by mastectomy (patient's preference) 2.adjuvant radiation therapy(she will not need radiation if she has a mastectomy) 3.Followed by adjuvant antiestrogen therapy with ovarian function suppression and aromatase inhibitor therapy Patient had her wedding on 10/22/2020 ---------------------------------------------------------------------------------------------------------------------------------------- Current treatment: Completed 4 cycles ofdose dense Adriamycin and Cytoxan, today cycle 2 Taxol Echocardiogram 10/13/2020: EF 60 to 65% Chemo toxicities: 1. Nausea and vomiting:Resolved 2. Fatigue 3.Dexamethasone related adverse effects:We discontinued dexamethasone because it made her feel jittery.  Monitoring closely for toxicities.  Return to clinic weekly for Taxol and every other week for follow-up with me

## 2021-01-12 ENCOUNTER — Other Ambulatory Visit: Payer: No Typology Code available for payment source

## 2021-01-12 ENCOUNTER — Ambulatory Visit (HOSPITAL_BASED_OUTPATIENT_CLINIC_OR_DEPARTMENT_OTHER): Payer: No Typology Code available for payment source | Admitting: Physician Assistant

## 2021-01-12 ENCOUNTER — Inpatient Hospital Stay: Payer: No Typology Code available for payment source

## 2021-01-12 ENCOUNTER — Ambulatory Visit: Payer: No Typology Code available for payment source

## 2021-01-12 ENCOUNTER — Other Ambulatory Visit: Payer: Self-pay

## 2021-01-12 VITALS — BP 112/75 | HR 96 | Temp 97.7°F | Resp 18 | Wt 227.0 lb

## 2021-01-12 DIAGNOSIS — Z5111 Encounter for antineoplastic chemotherapy: Secondary | ICD-10-CM | POA: Diagnosis not present

## 2021-01-12 DIAGNOSIS — T50905A Adverse effect of unspecified drugs, medicaments and biological substances, initial encounter: Secondary | ICD-10-CM | POA: Diagnosis not present

## 2021-01-12 DIAGNOSIS — Z170 Estrogen receptor positive status [ER+]: Secondary | ICD-10-CM

## 2021-01-12 DIAGNOSIS — C50412 Malignant neoplasm of upper-outer quadrant of left female breast: Secondary | ICD-10-CM

## 2021-01-12 DIAGNOSIS — Z95828 Presence of other vascular implants and grafts: Secondary | ICD-10-CM

## 2021-01-12 LAB — CMP (CANCER CENTER ONLY)
ALT: 46 U/L — ABNORMAL HIGH (ref 0–44)
AST: 22 U/L (ref 15–41)
Albumin: 3.8 g/dL (ref 3.5–5.0)
Alkaline Phosphatase: 53 U/L (ref 38–126)
Anion gap: 7 (ref 5–15)
BUN: 8 mg/dL (ref 6–20)
CO2: 27 mmol/L (ref 22–32)
Calcium: 9 mg/dL (ref 8.9–10.3)
Chloride: 105 mmol/L (ref 98–111)
Creatinine: 0.43 mg/dL — ABNORMAL LOW (ref 0.44–1.00)
GFR, Estimated: >60 mL/min (ref >60)
Glucose, Bld: 119 mg/dL — ABNORMAL HIGH (ref 70–99)
Potassium: 3.7 mmol/L (ref 3.5–5.1)
Sodium: 139 mmol/L (ref 135–145)
Total Bilirubin: 0.4 mg/dL (ref 0.3–1.2)
Total Protein: 6.4 g/dL — ABNORMAL LOW (ref 6.5–8.1)

## 2021-01-12 LAB — CBC WITH DIFFERENTIAL (CANCER CENTER ONLY)
Abs Immature Granulocytes: 0.02 10*3/uL (ref 0.00–0.07)
Basophils Absolute: 0 10*3/uL (ref 0.0–0.1)
Basophils Relative: 1 %
Eosinophils Absolute: 0 10*3/uL (ref 0.0–0.5)
Eosinophils Relative: 1 %
HCT: 26.5 % — ABNORMAL LOW (ref 36.0–46.0)
Hemoglobin: 8.8 g/dL — ABNORMAL LOW (ref 12.0–15.0)
Immature Granulocytes: 1 %
Lymphocytes Relative: 20 %
Lymphs Abs: 0.6 10*3/uL — ABNORMAL LOW (ref 0.7–4.0)
MCH: 29.1 pg (ref 26.0–34.0)
MCHC: 33.2 g/dL (ref 30.0–36.0)
MCV: 87.7 fL (ref 80.0–100.0)
Monocytes Absolute: 0.3 10*3/uL (ref 0.1–1.0)
Monocytes Relative: 8 %
Neutro Abs: 2.2 10*3/uL (ref 1.7–7.7)
Neutrophils Relative %: 69 %
Platelet Count: 260 10*3/uL (ref 150–400)
RBC: 3.02 MIL/uL — ABNORMAL LOW (ref 3.87–5.11)
RDW: 17.2 % — ABNORMAL HIGH (ref 11.5–15.5)
WBC Count: 3.2 10*3/uL — ABNORMAL LOW (ref 4.0–10.5)
nRBC: 0 % (ref 0.0–0.2)

## 2021-01-12 MED ORDER — FAMOTIDINE 20 MG IN NS 100 ML IVPB
20.0000 mg | Freq: Once | INTRAVENOUS | Status: AC
  Administered 2021-01-12: 11:00:00 20 mg via INTRAVENOUS
  Filled 2021-01-12: qty 100

## 2021-01-12 MED ORDER — DIPHENHYDRAMINE HCL 50 MG/ML IJ SOLN
12.5000 mg | Freq: Once | INTRAMUSCULAR | Status: AC
  Administered 2021-01-12: 11:00:00 12.5 mg via INTRAVENOUS
  Filled 2021-01-12: qty 1

## 2021-01-12 MED ORDER — SODIUM CHLORIDE 0.9 % IV SOLN: Freq: Once | INTRAVENOUS | Status: AC

## 2021-01-12 MED ORDER — METHYLPREDNISOLONE SODIUM SUCC 125 MG IJ SOLR
125.0000 mg | Freq: Once | INTRAMUSCULAR | Status: AC | PRN
  Administered 2021-01-12: 13:00:00 125 mg via INTRAVENOUS

## 2021-01-12 MED ORDER — SODIUM CHLORIDE 0.9 % IV SOLN: Freq: Once | INTRAVENOUS | Status: DC | PRN

## 2021-01-12 MED ORDER — SODIUM CHLORIDE 0.9% FLUSH
10.0000 mL | Freq: Once | INTRAVENOUS | Status: AC
  Administered 2021-01-12: 11:00:00 10 mL

## 2021-01-12 MED ORDER — HEPARIN SOD (PORK) LOCK FLUSH 100 UNIT/ML IV SOLN
500.0000 [IU] | Freq: Once | INTRAVENOUS | Status: AC | PRN
  Administered 2021-01-12: 13:00:00 500 [IU]

## 2021-01-12 MED ORDER — FAMOTIDINE 20 MG IN NS 100 ML IVPB
20.0000 mg | Freq: Once | INTRAVENOUS | Status: AC | PRN
  Administered 2021-01-12: 12:00:00 20 mg via INTRAVENOUS

## 2021-01-12 MED ORDER — SODIUM CHLORIDE 0.9 % IV SOLN
80.0000 mg/m2 | Freq: Once | INTRAVENOUS | Status: AC
  Administered 2021-01-12: 12:00:00 162 mg via INTRAVENOUS
  Filled 2021-01-12: qty 27

## 2021-01-12 MED ORDER — DIPHENHYDRAMINE HCL 50 MG/ML IJ SOLN
50.0000 mg | Freq: Once | INTRAMUSCULAR | Status: AC
  Administered 2021-01-12: 14:00:00 50 mg via INTRAVENOUS

## 2021-01-12 MED ORDER — SODIUM CHLORIDE 0.9% FLUSH
10.0000 mL | INTRAVENOUS | Status: DC | PRN
  Administered 2021-01-12: 13:00:00 10 mL

## 2021-01-12 NOTE — Patient Instructions (Signed)
[facility]COL[facility]  Discharge Instructions: Thank you for choosing [facility] to provide your oncology and hematology care.   If you have a lab appointment with the [facility], please go directly to the [facility] and check in at the registration area.   Wear comfortable clothing and clothing appropriate for easy access to any Portacath or PICC line.   We strive to give you quality time with your provider. You may need to reschedule your appointment if you arrive late (15 or more minutes).  Arriving late affects you and other patients whose appointments are after yours.  Also, if you miss three or more appointments without notifying the office, you may be dismissed from the clinic at the providers discretion.      For prescription refill requests, have your pharmacy contact our office and allow 72 hours for refills to be completed.    Today you received the following chemotherapy and/or immunotherapy agents: Taxol.      To help prevent nausea and vomiting after your treatment, we encourage you to take your nausea medication as directed.  BELOW ARE SYMPTOMS THAT SHOULD BE REPORTED IMMEDIATELY: *FEVER GREATER THAN 100.4 F (38 C) OR HIGHER *CHILLS OR SWEATING *NAUSEA AND VOMITING THAT IS NOT CONTROLLED WITH YOUR NAUSEA MEDICATION *UNUSUAL SHORTNESS OF BREATH *UNUSUAL BRUISING OR BLEEDING *URINARY PROBLEMS (pain or burning when urinating, or frequent urination) *BOWEL PROBLEMS (unusual diarrhea, constipation, pain near the anus) TENDERNESS IN MOUTH AND THROAT WITH OR WITHOUT PRESENCE OF ULCERS (sore throat, sores in mouth, or a toothache) UNUSUAL RASH, SWELLING OR PAIN  UNUSUAL VAGINAL DISCHARGE OR ITCHING   Items with * indicate a potential emergency and should be followed up as soon as possible or go to the Emergency Department if any problems should occur.  Please show the CHEMOTHERAPY ALERT CARD or IMMUNOTHERAPY ALERT CARD at check-in to the  Emergency Department and triage nurse.  Should you have questions after your visit or need to cancel or reschedule your appointment, please contact [facility]  Dept: [phone]  and follow the prompts.  Office hours are 8:00 a.m. to 4:30 p.m. Monday - Friday. Please note that voicemails left after 4:00 p.m. may not be returned until the following business day.  We are closed weekends and major holidays. You have access to a nurse at all times for urgent questions. Please call the main number to the clinic Dept: [phone] and follow the prompts.   For any non-urgent questions, you may also contact your provider using MyChart. We now offer e-Visits for anyone 25 and older to request care online for non-urgent symptoms. For details visit [email or web address].   Also download the MyChart app! Go to the app store, search "MyChart", open the app, select [facility], and log in with your MyChart username and password.  Due to Covid, a mask is required upon entering the hospital/clinic. If you do not have a mask, one will be given to you upon arrival. For doctor visits, patients may have 1 support person aged 25 or older with them. For treatment visits, patients cannot have anyone with them due to current Covid guidelines and our immunocompromised population.

## 2021-01-12 NOTE — Progress Notes (Signed)
DATE:  01/12/21                                        X CHEMO/IMMUNOTHERAPY REACTION             MD: Lindi Adie   AGENT/BLOOD PRODUCT RECEIVING TODAY:              Paclitaxel   AGENT/BLOOD PRODUCT RECEIVING IMMEDIATELY PRIOR TO REACTION:          Paclitaxel   VS: BP:     135/100   P:       120       SPO2:       100 % on 4L                BP:     116/85   P:       99       SPO2:       100% on room air     REACTION(S):           flushing, diaphoresis, shortness of breath, heart racing, shortness of breath   PREMEDS:     Pepcid 20 mg, Benadryl 12.5 mg   INTERVENTION: IVF, Solumedrol 125 mg, pepcid 20 mg, Benadryl 50 mg   Review of Systems  Review of Systems  Constitutional:  Positive for diaphoresis. Negative for chills and fever.  HENT:  Negative for facial swelling.   Eyes:  Negative for visual disturbance.  Respiratory:  Positive for shortness of breath. Negative for cough, chest tightness and wheezing.   Cardiovascular:  Positive for palpitations. Negative for chest pain.  Gastrointestinal:  Positive for nausea.  Endocrine: Negative for cold intolerance.  Musculoskeletal:  Negative for arthralgias, joint swelling and myalgias.  Skin:  Positive for color change (face and neck flushing).  Neurological:  Negative for dizziness, seizures and numbness.  Psychiatric/Behavioral:  The patient is nervous/anxious.     Physical Exam  Physical Exam Vitals and nursing note reviewed.  Constitutional:      General: She is in acute distress.     Appearance: She is well-developed. She is diaphoretic. She is not toxic-appearing.  HENT:     Head: Normocephalic and atraumatic.     Nose: Nose normal.  Eyes:     General: No scleral icterus.       Right eye: No discharge.        Left eye: No discharge.     Conjunctiva/sclera: Conjunctivae normal.  Neck:     Vascular: No JVD.  Cardiovascular:     Rate and Rhythm: Regular rhythm. Tachycardia present.     Pulses: Normal pulses.           Radial pulses are 2+ on the right side and 2+ on the left side.     Heart sounds: Normal heart sounds.  Pulmonary:     Effort: Pulmonary effort is normal. No respiratory distress.     Breath sounds: Normal breath sounds. No stridor. No wheezing, rhonchi or rales.  Chest:     Chest wall: No tenderness.     Comments: Port in right upper chest without signs of infection Abdominal:     General: There is no distension.  Musculoskeletal:        General: Normal range of motion.     Cervical back: Normal range of motion.     Right lower leg: No  edema.     Left lower leg: No edema.  Skin:    General: Skin is warm.     Capillary Refill: Capillary refill takes less than 2 seconds.     Findings: Erythema (face and neck flushing) present.  Neurological:     Mental Status: She is oriented to person, place, and time.     GCS: GCS eye subscore is 4. GCS verbal subscore is 5. GCS motor subscore is 6.     Comments: Fluent speech, no facial droop.  Psychiatric:        Mood and Affect: Mood is anxious.        Behavior: Behavior normal.    Assessment and Plan:            Patient seen for reaction in infusion center. She was 5 minutes into Taxol treatment, this was her third treatment. She tolerated the previous treatments without difficulty. She unfortunately had significant reaction today.  She improved after interventions and returned to baseline.  Given severity of reaction discussed with oncologist Dr. Chryl Heck who agrees with plan to discontinue treatment today.  Patient is followed by Dr. Lindi Adie however he is out of the office until 01/15/21. I will follow up with him when he returns to discuss if patient would be candidate for changing therapy to Abraxane so the process can be started prior to her next scheduled treatment.  Patient agreeable with plan of care. Discharged home after observation in stable condition.

## 2021-01-12 NOTE — Progress Notes (Signed)
Hypersensitivity Reaction note  Date of event: 01/12/21 Time of event: 1224 Generic name of drug involved: paclitaxel Name of provider notified of the hypersensitivity reaction: Sherol Dade, PA Was agent that likely caused hypersensitivity reaction added to Allergies List within EMR? Yes  Chain of events including reaction signs/symptoms, treatment administered, and outcome (e.g., drug resumed; drug discontinued; sent to Emergency Department; etc.) Patient c/o shortness of breath, palpitations, and flushing in face and neck.  Taxol stopped and normal saline started to gravity.  Patient received Pepcid 20mg , Benadryl 50mg , and Solu-Medrol 125mg  as emergency medications to control reaction.  Patient stated she was feeling better after receiving medications.  Per Dr. Chryl Heck and PA, paclitaxel was discontinued for today.  Patient discharged at 1315, VSS.  No other s/s or c/o distress or discomfort noted at discharge.  Wylene Men, RN 01/12/2021 1:35 PM

## 2021-01-18 NOTE — Progress Notes (Incomplete)
Patient Care Team: Kerry Dory, NP as PCP - General (Nurse Practitioner) Rockwell Germany, RN as Oncology Nurse Navigator Mauro Kaufmann, RN as Oncology Nurse Navigator Nicholas Lose, MD as Consulting Physician (Hematology and Oncology) Rolm Bookbinder, MD as Consulting Physician (General Surgery)  DIAGNOSIS: No diagnosis found.  SUMMARY OF ONCOLOGIC HISTORY: Oncology History  Malignant neoplasm of upper-outer quadrant of left breast in female, estrogen receptor positive ([place])  09/25/2020 Initial Diagnosis   Palpable lump in the left breast.  Mammogram revealed 3 cm tumor and a possible 5 mm satellite nodule.  Biopsy revealed grade 3 IDC ER 40% weak, PR 0%, HER2 negative, Ki-67 60%   09/25/2020 Cancer Staging   Staging form: Breast, AJCC 8th Edition - Clinical stage from 09/25/2020: Stage IIB (cT2, cN0, cM0, G3, ER+, PR-, HER2-) - Signed by Nicholas Lose, MD on 09/25/2020 Stage prefix: Initial diagnosis Histologic grading system: 3 grade system    10/02/2020 Genetic Testing   Negative hereditary cancer genetic testing: no pathogenic variants detected in [facility] BRCAPlus Panel and [facility] CancerNext-Expanded +RNAinsight Panel.  The report dates are October 02, 2020 and October 13, 2020, respectively.   The BRCAplus panel offered by [employer] and includes sequencing and deletion/duplication analysis for the following 8 genes: ATM, BRCA1, BRCA2, CDH1, CHEK2, PALB2, PTEN, and TP53.  The CancerNext-Expanded gene panel offered by [facility] and includes sequencing, rearrangement, and RNA analysis for the following 77 genes: AIP, ALK, APC, ATM, AXIN2, BAP1, BARD1, BLM, BMPR1A, BRCA1, BRCA2, BRIP1, CDC73, CDH1, CDK4, CDKN1B, CDKN2A, CHEK2, CTNNA1, DICER1, FANCC, FH, FLCN, GALNT12, KIF1B, LZTR1, MAX, MEN1, MET, MLH1, MSH2, MSH3, MSH6, MUTYH, NBN, NF1, NF2, NTHL1, PALB2, PHOX2B, PMS2, POT1, PRKAR1A, PTCH1, PTEN, RAD51C, RAD51D, RB1, RECQL, RET, SDHA, SDHAF2, SDHB, SDHC, SDHD, SMAD4,  SMARCA4, SMARCB1, SMARCE1, STK11, SUFU, TMEM127, TP53, TSC1, TSC2, VHL and XRCC2 (sequencing and deletion/duplication); EGFR, EGLN1, HOXB13, KIT, MITF, PDGFRA, POLD1, and POLE (sequencing only); EPCAM and GREM1 (deletion/duplication only).    11/03/2020 -  Chemotherapy   Patient is on Treatment Plan : BREAST ADJUVANT DOSE DENSE AC q14d / PACLitaxel q7d       CHIEF COMPLIANT: Cycle 4 Taxol  INTERVAL HISTORY: Allison Dodson is a 26 y.o. with above-mentioned history of breast cancer, currently undergoing dose dense Adriamycin and Cytoxan followed by Taxol. She presents to the clinic today for a toxicity check and treatment.   ALLERGIES:  is allergic to paclitaxel.  MEDICATIONS:  Current Outpatient Medications  Medication Sig Dispense Refill   ALPRAZolam (XANAX) 0.25 MG tablet Take by mouth.     lidocaine-prilocaine (EMLA) cream Apply to affected area once 30 g 3   No current facility-administered medications for this visit.    PHYSICAL EXAMINATION: ECOG PERFORMANCE STATUS: {CHL ONC ECOG PS:[phone]}  There were no vitals filed for this visit. There were no vitals filed for this visit.  LABORATORY DATA:  I have reviewed the data as listed CMP Latest Ref Rng & Units 01/12/2021 01/05/2021 12/29/2020  Glucose 70 - 99 mg/dL 119(H) 112(H) 118(H)  BUN 6 - 20 mg/dL $Remove'8 8 8  'YaOATjG$ Creatinine 0.44 - 1.00 mg/dL 0.43(L) 0.40(L) 0.57  Sodium 135 - 145 mmol/L 139 140 140  Potassium 3.5 - 5.1 mmol/L 3.7 3.7 3.7  Chloride 98 - 111 mmol/L 105 107 107  CO2 22 - 32 mmol/L $RemoveB'27 26 25  'nzxFMDUD$ Calcium 8.9 - 10.3 mg/dL 9.0 9.0 8.6(L)  Total Protein 6.5 - 8.1 g/dL 6.4(L) 6.4(L) 6.4(L)  Total Bilirubin 0.3 - 1.2 mg/dL 0.4  0.3 0.3  Alkaline Phos 38 - 126 U/L 53 59 73  AST 15 - 41 U/L $Remo'22 23 18  'rzeSK$ ALT 0 - 44 U/L 46(H) 44 21    Lab Results  Component Value Date   WBC 3.2 (L) 01/12/2021   HGB 8.8 (L) 01/12/2021   HCT 26.5 (L) 01/12/2021   MCV 87.7 01/12/2021   PLT 260 01/12/2021   NEUTROABS 2.2 01/12/2021     ASSESSMENT & PLAN:  No problem-specific Assessment & Plan notes found for this encounter.    No orders of the defined types were placed in this encounter.  The patient has a good understanding of the overall plan. she agrees with it. she will call with any problems that may develop before the next visit here.  Total time spent: *** mins including face to face time and time spent for planning, charting and coordination of care  Rulon Eisenmenger, MD, MPH 01/18/2021  I, Thana Ates, am acting as scribe for Dr. Nicholas Lose.  {insert scribe attestation}

## 2021-01-19 ENCOUNTER — Other Ambulatory Visit: Payer: Self-pay | Admitting: Hematology and Oncology

## 2021-01-19 ENCOUNTER — Inpatient Hospital Stay: Payer: No Typology Code available for payment source | Admitting: Hematology and Oncology

## 2021-01-19 ENCOUNTER — Inpatient Hospital Stay: Payer: No Typology Code available for payment source

## 2021-01-19 NOTE — Assessment & Plan Note (Deleted)
09/22/2020: Palpable mass in the left breast corresponding to suspicious mass on mammogram measuring 3 cm, possible 5 mm satellite nodule, left axilla negative, right breast negative ER 40% weak, PR negative, HER2 negative, Ki-67 60%  Breast MRI 09/28/2020: 3.7 cm enhancing mass at 3 o'clock position left breast with a possible 7 mm satellite nodule, no abnormal lymph nodes.  Treatment plan: 1.Neoadjuvant chemotherapy with dose dense Adriamycin and Cytoxan followed by Taxol weekly x12 2.followed by mastectomy (patient's preference) 2.adjuvant radiation therapy(she will not need radiation if she has a mastectomy) 3.Followed by adjuvant antiestrogen therapy with ovarian function suppression and aromatase inhibitor therapy Patient had her wedding on 10/22/2020 ---------------------------------------------------------------------------------------------------------------------------------------- Current treatment:Completed 4 cycles ofdose dense Adriamycin and Cytoxan, today cycle 4  Abraxane Echocardiogram 10/13/2020: EF 60 to 65% Chemo toxicities: 1. Denies any nausea or vomiting 2. Fatigue: Mild 3. Chemotherapy-induced anemia: Monitoring closely today's hemoglobin is 8.8. 4. Allergic reaction to cycle 3 Taxol: Taxol was discontinued and orders were placed for Abraxane.  Monitoring closely for toxicities.  Return to clinic weekly for Abraxane and every other week for follow-up with me

## 2021-01-19 NOTE — Progress Notes (Signed)
Severe allergic reaction to Taxol We will request prior authorization to switch to Abraxane

## 2021-01-21 ENCOUNTER — Telehealth: Payer: Self-pay

## 2021-01-21 NOTE — Telephone Encounter (Addendum)
Called Patient regarding Disability form received from [facility] and request for medical records. Patient informed of need for new signed release of information form with requested dates of service from [facility]. Awaiting signed release of information form before forwarding to HIM.

## 2021-01-26 ENCOUNTER — Ambulatory Visit: Payer: No Typology Code available for payment source

## 2021-01-26 ENCOUNTER — Inpatient Hospital Stay: Payer: No Typology Code available for payment source

## 2021-01-26 ENCOUNTER — Inpatient Hospital Stay: Payer: No Typology Code available for payment source | Attending: Hematology and Oncology

## 2021-01-26 ENCOUNTER — Other Ambulatory Visit: Payer: Self-pay

## 2021-01-26 ENCOUNTER — Other Ambulatory Visit: Payer: Self-pay | Admitting: Hematology and Oncology

## 2021-01-26 ENCOUNTER — Telehealth: Payer: Self-pay

## 2021-01-26 VITALS — BP 136/79 | HR 100 | Temp 98.3°F | Resp 18

## 2021-01-26 DIAGNOSIS — Z5111 Encounter for antineoplastic chemotherapy: Secondary | ICD-10-CM | POA: Diagnosis not present

## 2021-01-26 DIAGNOSIS — T451X5A Adverse effect of antineoplastic and immunosuppressive drugs, initial encounter: Secondary | ICD-10-CM | POA: Diagnosis not present

## 2021-01-26 DIAGNOSIS — Z170 Estrogen receptor positive status [ER+]: Secondary | ICD-10-CM | POA: Diagnosis not present

## 2021-01-26 DIAGNOSIS — R5383 Other fatigue: Secondary | ICD-10-CM | POA: Insufficient documentation

## 2021-01-26 DIAGNOSIS — C50412 Malignant neoplasm of upper-outer quadrant of left female breast: Secondary | ICD-10-CM | POA: Diagnosis present

## 2021-01-26 DIAGNOSIS — Z95828 Presence of other vascular implants and grafts: Secondary | ICD-10-CM

## 2021-01-26 DIAGNOSIS — Z79899 Other long term (current) drug therapy: Secondary | ICD-10-CM | POA: Diagnosis not present

## 2021-01-26 DIAGNOSIS — D6481 Anemia due to antineoplastic chemotherapy: Secondary | ICD-10-CM | POA: Diagnosis not present

## 2021-01-26 LAB — CMP (CANCER CENTER ONLY)
ALT: 45 U/L — ABNORMAL HIGH (ref 0–44)
AST: 28 U/L (ref 15–41)
Albumin: 4 g/dL (ref 3.5–5.0)
Alkaline Phosphatase: 64 U/L (ref 38–126)
Anion gap: 7 (ref 5–15)
BUN: 9 mg/dL (ref 6–20)
CO2: 27 mmol/L (ref 22–32)
Calcium: 9.3 mg/dL (ref 8.9–10.3)
Chloride: 105 mmol/L (ref 98–111)
Creatinine: 0.53 mg/dL (ref 0.44–1.00)
GFR, Estimated: >60 mL/min (ref >60)
Glucose, Bld: 109 mg/dL — ABNORMAL HIGH (ref 70–99)
Potassium: 3.8 mmol/L (ref 3.5–5.1)
Sodium: 139 mmol/L (ref 135–145)
Total Bilirubin: 0.3 mg/dL (ref 0.3–1.2)
Total Protein: 7.2 g/dL (ref 6.5–8.1)

## 2021-01-26 LAB — CBC WITH DIFFERENTIAL (CANCER CENTER ONLY)
Abs Immature Granulocytes: 0.05 10*3/uL (ref 0.00–0.07)
Basophils Absolute: 0 10*3/uL (ref 0.0–0.1)
Basophils Relative: 0 %
Eosinophils Absolute: 0.1 10*3/uL (ref 0.0–0.5)
Eosinophils Relative: 2 %
HCT: 32.9 % — ABNORMAL LOW (ref 36.0–46.0)
Hemoglobin: 11 g/dL — ABNORMAL LOW (ref 12.0–15.0)
Immature Granulocytes: 1 %
Lymphocytes Relative: 13 %
Lymphs Abs: 0.8 10*3/uL (ref 0.7–4.0)
MCH: 28.6 pg (ref 26.0–34.0)
MCHC: 33.4 g/dL (ref 30.0–36.0)
MCV: 85.5 fL (ref 80.0–100.0)
Monocytes Absolute: 0.3 10*3/uL (ref 0.1–1.0)
Monocytes Relative: 5 %
Neutro Abs: 4.9 10*3/uL (ref 1.7–7.7)
Neutrophils Relative %: 79 %
Platelet Count: 244 10*3/uL (ref 150–400)
RBC: 3.85 MIL/uL — ABNORMAL LOW (ref 3.87–5.11)
RDW: 15.3 % (ref 11.5–15.5)
WBC Count: 6.2 10*3/uL (ref 4.0–10.5)
nRBC: 0 % (ref 0.0–0.2)

## 2021-01-26 MED ORDER — HEPARIN SOD (PORK) LOCK FLUSH 100 UNIT/ML IV SOLN: 500.0000 [IU] | Freq: Once | INTRAVENOUS | Status: DC

## 2021-01-26 MED ORDER — SODIUM CHLORIDE 0.9 % IV SOLN: Freq: Once | INTRAVENOUS | Status: AC

## 2021-01-26 MED ORDER — SODIUM CHLORIDE 0.9% FLUSH
10.0000 mL | INTRAVENOUS | Status: DC | PRN
  Administered 2021-01-26: 10 mL

## 2021-01-26 MED ORDER — PACLITAXEL PROTEIN-BOUND CHEMO INJECTION 100 MG
175.0000 mg | Freq: Once | INTRAVENOUS | Status: AC
  Administered 2021-01-26: 175 mg via INTRAVENOUS
  Filled 2021-01-26: qty 35

## 2021-01-26 MED ORDER — ONDANSETRON HCL 4 MG/2ML IJ SOLN
8.0000 mg | Freq: Once | INTRAMUSCULAR | Status: AC
  Administered 2021-01-26: 8 mg via INTRAVENOUS
  Filled 2021-01-26: qty 4

## 2021-01-26 MED ORDER — SODIUM CHLORIDE 0.9 % IV SOLN: 8.0000 mg | Freq: Once | INTRAVENOUS | Status: DC

## 2021-01-26 MED ORDER — SODIUM CHLORIDE 0.9% FLUSH
10.0000 mL | Freq: Once | INTRAVENOUS | Status: AC
  Administered 2021-01-26: 10 mL

## 2021-01-26 MED ORDER — HEPARIN SOD (PORK) LOCK FLUSH 100 UNIT/ML IV SOLN
500.0000 [IU] | Freq: Once | INTRAVENOUS | Status: AC | PRN
  Administered 2021-01-26: 500 [IU]

## 2021-01-26 NOTE — Patient Instructions (Signed)
[facility] ONCOLOGY  Discharge Instructions: Thank you for choosing [facility] to provide your oncology and hematology care.   If you have a lab appointment with the [facility], please go directly to the [facility] and check in at the registration area.   Wear comfortable clothing and clothing appropriate for easy access to any Portacath or PICC line.   We strive to give you quality time with your provider. You may need to reschedule your appointment if you arrive late (15 or more minutes).  Arriving late affects you and other patients whose appointments are after yours.  Also, if you miss three or more appointments without notifying the office, you may be dismissed from the clinic at the providers discretion.      For prescription refill requests, have your pharmacy contact our office and allow 72 hours for refills to be completed.    Today you received the following chemotherapy and/or immunotherapy agents: Abraxane.       To help prevent nausea and vomiting after your treatment, we encourage you to take your nausea medication as directed.  BELOW ARE SYMPTOMS THAT SHOULD BE REPORTED IMMEDIATELY: *FEVER GREATER THAN 100.4 F (38 C) OR HIGHER *CHILLS OR SWEATING *NAUSEA AND VOMITING THAT IS NOT CONTROLLED WITH YOUR NAUSEA MEDICATION *UNUSUAL SHORTNESS OF BREATH *UNUSUAL BRUISING OR BLEEDING *URINARY PROBLEMS (pain or burning when urinating, or frequent urination) *BOWEL PROBLEMS (unusual diarrhea, constipation, pain near the anus) TENDERNESS IN MOUTH AND THROAT WITH OR WITHOUT PRESENCE OF ULCERS (sore throat, sores in mouth, or a toothache) UNUSUAL RASH, SWELLING OR PAIN  UNUSUAL VAGINAL DISCHARGE OR ITCHING   Items with * indicate a potential emergency and should be followed up as soon as possible or go to the Emergency Department if any problems should occur.  Please show the CHEMOTHERAPY ALERT CARD or IMMUNOTHERAPY ALERT CARD at check-in to  the Emergency Department and triage nurse.  Should you have questions after your visit or need to cancel or reschedule your appointment, please contact [facility]  Dept: [phone]  and follow the prompts.  Office hours are 8:00 a.m. to 4:30 p.m. Monday - Friday. Please note that voicemails left after 4:00 p.m. may not be returned until the following business day.  We are closed weekends and major holidays. You have access to a nurse at all times for urgent questions. Please call the main number to the clinic Dept: [phone] and follow the prompts.   For any non-urgent questions, you may also contact your provider using MyChart. We now offer e-Visits for anyone 70 and older to request care online for non-urgent symptoms. For details visit [email or web address].   Also download the MyChart app! Go to the app store, search "MyChart", open the app, select [facility], and log in with your MyChart username and password.  Due to Covid, a mask is required upon entering the hospital/clinic. If you do not have a mask, one will be given to you upon arrival. For doctor visits, patients may have 1 support person aged 11 or older with them. For treatment visits, patients cannot have anyone with them due to current Covid guidelines and our immunocompromised population.   Nanoparticle Albumin-Bound Paclitaxel injection What is this medication? NANOPARTICLE ALBUMIN-BOUND PACLITAXEL (Na no PAHR ti kuhl  al BYOO muhn-bound  PAK li TAX el) is a chemotherapy drug. It targets fast dividing cells, like cancer cells, and causes these cells to die. This medicine is used to  treat advanced breast cancer, lung cancer, and pancreatic cancer. This medicine may be used for other purposes; ask your health care provider or pharmacist if you have questions. COMMON BRAND NAME(S): Abraxane What should I tell my care team before I take this medication? They need to know if you have any of these  conditions: kidney disease liver disease low blood counts, like low white cell, platelet, or red cell counts lung or breathing disease, like asthma tingling of the fingers or toes, or other nerve disorder an unusual or allergic reaction to paclitaxel, albumin, other chemotherapy, other medicines, foods, dyes, or preservatives pregnant or trying to get pregnant breast-feeding How should I use this medication? This drug is given as an infusion into a vein. It is administered in a hospital or clinic by a specially trained health care professional. Talk to your pediatrician regarding the use of this medicine in children. Special care may be needed. Overdosage: If you think you have taken too much of this medicine contact a poison control center or emergency room at once. NOTE: This medicine is only for you. Do not share this medicine with others. What if I miss a dose? It is important not to miss your dose. Call your doctor or health care professional if you are unable to keep an appointment. What may interact with this medication? This medicine may interact with the following medications: antiviral medicines for hepatitis, HIV or AIDS certain antibiotics like erythromycin and clarithromycin certain medicines for fungal infections like ketoconazole and itraconazole certain medicines for seizures like carbamazepine, phenobarbital, phenytoin gemfibrozil nefazodone rifampin St. John's wort This list may not describe all possible interactions. Give your health care provider a list of all the medicines, herbs, non-prescription drugs, or dietary supplements you use. Also tell them if you smoke, drink alcohol, or use illegal drugs. Some items may interact with your medicine. What should I watch for while using this medication? Your condition will be monitored carefully while you are receiving this medicine. You will need important blood work done while you are taking this medicine. This medicine  can cause serious allergic reactions. If you experience allergic reactions like skin rash, itching or hives, swelling of the face, lips, or tongue, tell your doctor or health care professional right away. In some cases, you may be given additional medicines to help with side effects. Follow all directions for their use. This drug may make you feel generally unwell. This is not uncommon, as chemotherapy can affect healthy cells as well as cancer cells. Report any side effects. Continue your course of treatment even though you feel ill unless your doctor tells you to stop. Call your doctor or health care professional for advice if you get a fever, chills or sore throat, or other symptoms of a cold or flu. Do not treat yourself. This drug decreases your body's ability to fight infections. Try to avoid being around people who are sick. This medicine may increase your risk to bruise or bleed. Call your doctor or health care professional if you notice any unusual bleeding. Be careful brushing and flossing your teeth or using a toothpick because you may get an infection or bleed more easily. If you have any dental work done, tell your dentist you are receiving this medicine. Avoid taking products that contain aspirin, acetaminophen, ibuprofen, naproxen, or ketoprofen unless instructed by your doctor. These medicines may hide a fever. Do not become pregnant while taking this medicine or for 6 months after stopping it. Women should inform  their doctor if they wish to become pregnant or think they might be pregnant. Men should not father a child while taking this medicine or for 3 months after stopping it. There is a potential for serious side effects to an unborn child. Talk to your health care professional or pharmacist for more information. Do not breast-feed an infant while taking this medicine or for 2 weeks after stopping it. This medicine may interfere with the ability to get pregnant or to father a child. You  should talk to your doctor or health care professional if you are concerned about your fertility. What side effects may I notice from receiving this medication? Side effects that you should report to your doctor or health care professional as soon as possible: allergic reactions like skin rash, itching or hives, swelling of the face, lips, or tongue breathing problems changes in vision fast, irregular heartbeat low blood pressure mouth sores pain, tingling, numbness in the hands or feet signs of decreased platelets or bleeding - bruising, pinpoint red spots on the skin, black, tarry stools, blood in the urine signs of decreased red blood cells - unusually weak or tired, feeling faint or lightheaded, falls signs of infection - fever or chills, cough, sore throat, pain or difficulty passing urine signs and symptoms of liver injury like dark yellow or brown urine; general ill feeling or flu-like symptoms; light-colored stools; loss of appetite; nausea; right upper belly pain; unusually weak or tired; yellowing of the eyes or skin swelling of the ankles, feet, hands unusually slow heartbeat Side effects that usually do not require medical attention (report to your doctor or health care professional if they continue or are bothersome): diarrhea hair loss loss of appetite nausea, vomiting tiredness This list may not describe all possible side effects. Call your doctor for medical advice about side effects. You may report side effects to FDA at 1-800-FDA-1088. Where should I keep my medication? This drug is given in a hospital or clinic and will not be stored at home. NOTE: This sheet is a summary. It may not cover all possible information. If you have questions about this medicine, talk to your doctor, pharmacist, or health care provider.  2022 Elsevier/Gold Standard (2016-09-06 00:00:00)

## 2021-01-26 NOTE — Telephone Encounter (Signed)
Telephone Open  01/26/2021 [facility]   Created in errorxxx

## 2021-01-26 NOTE — Progress Notes (Signed)
Updated BSA today and will increase Abraxane to 175mg  (80 mg/m2).  Updated future orders as well. Ok to proceed with tx today with pending ins auth.  Raul Del [place], [place], BCPS, BCOP 01/26/2021 12:32 PM

## 2021-01-27 ENCOUNTER — Telehealth: Payer: Self-pay

## 2021-01-27 NOTE — Telephone Encounter (Signed)
Allison Dodson states that she is doing well. She is eating,drinking, and urinating well. Denies N/V or any other symptoms. She knows to call the office at [phone] if she has any questions or concerns.

## 2021-01-27 NOTE — Telephone Encounter (Signed)
-----   Message from Rafael Bihari, RN sent at 01/26/2021  2:24 PM EST ----- Regarding: Dr Lindi Adie, first time Abraxane First time Abraxane on 01/26/21. Tolerated well. Needs call back.

## 2021-02-01 NOTE — Progress Notes (Signed)
Patient Care Team: Kerry Dory, NP as PCP - General (Nurse Practitioner) Rockwell Germany, RN as Oncology Nurse Navigator Mauro Kaufmann, RN as Oncology Nurse Navigator Nicholas Lose, MD as Consulting Physician (Hematology and Oncology) Rolm Bookbinder, MD as Consulting Physician (General Surgery)  DIAGNOSIS:    ICD-10-CM   1. Malignant neoplasm of upper-outer quadrant of left breast in female, estrogen receptor positive ([place])  C50.412    Z17.0       SUMMARY OF ONCOLOGIC HISTORY: Oncology History  Malignant neoplasm of upper-outer quadrant of left breast in female, estrogen receptor positive ([place])  09/25/2020 Initial Diagnosis   Palpable lump in the left breast.  Mammogram revealed 3 cm tumor and a possible 5 mm satellite nodule.  Biopsy revealed grade 3 IDC ER 40% weak, PR 0%, HER2 negative, Ki-67 60%   09/25/2020 Cancer Staging   Staging form: Breast, AJCC 8th Edition - Clinical stage from 09/25/2020: Stage IIB (cT2, cN0, cM0, G3, ER+, PR-, HER2-) - Signed by Nicholas Lose, MD on 09/25/2020 Stage prefix: Initial diagnosis Histologic grading system: 3 grade system    10/02/2020 Genetic Testing   Negative hereditary cancer genetic testing: no pathogenic variants detected in [facility] BRCAPlus Panel and [facility] CancerNext-Expanded +RNAinsight Panel.  The report dates are October 02, 2020 and October 13, 2020, respectively.   The BRCAplus panel offered by [employer] and includes sequencing and deletion/duplication analysis for the following 8 genes: ATM, BRCA1, BRCA2, CDH1, CHEK2, PALB2, PTEN, and TP53.  The CancerNext-Expanded gene panel offered by [facility] and includes sequencing, rearrangement, and RNA analysis for the following 77 genes: AIP, ALK, APC, ATM, AXIN2, BAP1, BARD1, BLM, BMPR1A, BRCA1, BRCA2, BRIP1, CDC73, CDH1, CDK4, CDKN1B, CDKN2A, CHEK2, CTNNA1, DICER1, FANCC, FH, FLCN, GALNT12, KIF1B, LZTR1, MAX, MEN1, MET, MLH1, MSH2, MSH3, MSH6, MUTYH, NBN, NF1, NF2,  NTHL1, PALB2, PHOX2B, PMS2, POT1, PRKAR1A, PTCH1, PTEN, RAD51C, RAD51D, RB1, RECQL, RET, SDHA, SDHAF2, SDHB, SDHC, SDHD, SMAD4, SMARCA4, SMARCB1, SMARCE1, STK11, SUFU, TMEM127, TP53, TSC1, TSC2, VHL and XRCC2 (sequencing and deletion/duplication); EGFR, EGLN1, HOXB13, KIT, MITF, PDGFRA, POLD1, and POLE (sequencing only); EPCAM and GREM1 (deletion/duplication only).    11/03/2020 -  Chemotherapy   Patient is on Treatment Plan : BREAST ADJUVANT DOSE DENSE AC q14d / PACLitaxel switched to Abraxane (due to rxn) q7d       CHIEF COMPLIANT: Cycle 6 Taxol  INTERVAL HISTORY: Allison Dodson is a 26 y.o. with above-mentioned history of breast cancer, currently undergoing dose dense Adriamycin and Cytoxan followed by Taxol. She presents to the clinic today for a toxicity check and treatment.   ALLERGIES:  is allergic to paclitaxel.  MEDICATIONS:  Current Outpatient Medications  Medication Sig Dispense Refill   ALPRAZolam (XANAX) 0.25 MG tablet Take by mouth.     lidocaine-prilocaine (EMLA) cream Apply to affected area once 30 g 3   No current facility-administered medications for this visit.    PHYSICAL EXAMINATION: ECOG PERFORMANCE STATUS: 1 - Symptomatic but completely ambulatory  Vitals:   02/02/21 0917  BP: 136/79  Pulse: (!) 115  Resp: 18  Temp: (!) 97.5 F (36.4 C)  SpO2: 100%   Filed Weights   02/02/21 0917  Weight: 229 lb 6.4 oz (104.1 kg)    LABORATORY DATA:  I have reviewed the data as listed CMP Latest Ref Rng & Units 01/26/2021 01/12/2021 01/05/2021  Glucose 70 - 99 mg/dL 109(H) 119(H) 112(H)  BUN 6 - 20 mg/dL _0 Creatinine 0.44 - 1.00 mg/dL 0.53 0.43(L)  0.40(L)  Sodium 135 - 145 mmol/L 139 139 140  Potassium 3.5 - 5.1 mmol/L 3.8 3.7 3.7  Chloride 98 - 111 mmol/L 105 105 107  CO2 22 - 32 mmol/L _0 Calcium 8.9 - 10.3 mg/dL 9.3 9.0 9.0  Total Protein 6.5 - 8.1 g/dL 7.2 6.4(L) 6.4(L)  Total Bilirubin 0.3 - 1.2 mg/dL 0.3 0.4 0.3  Alkaline Phos 38 - 126 U/L 64 53  59  AST 15 - 41 U/L _1 ALT 0 - 44 U/L 45(H) 46(H) 44    Lab Results  Component Value Date   WBC 3.7 (L) 02/02/2021   HGB 9.8 (L) 02/02/2021   HCT 30.3 (L) 02/02/2021   MCV 86.1 02/02/2021   PLT 267 02/02/2021   NEUTROABS 2.6 02/02/2021    ASSESSMENT & PLAN:  Malignant neoplasm of upper-outer quadrant of left breast in female, estrogen receptor positive ([place]) 09/22/2020: Palpable mass in the left breast corresponding to suspicious mass on mammogram measuring 3 cm, possible 5 mm satellite nodule, left axilla negative, right breast negative ER 40% weak, PR negative, HER2 negative, Ki-67 60%   Breast MRI 09/28/2020: 3.7 cm enhancing mass at 3 o'clock position left breast with a possible 7 mm satellite nodule, no abnormal lymph nodes.   Treatment plan: 1.  Neoadjuvant chemotherapy with dose dense Adriamycin and Cytoxan followed by Taxol weekly x12 2. followed by mastectomy (patient's preference) 2. adjuvant radiation therapy (she will not need radiation if she has a mastectomy) 3.  Followed by adjuvant antiestrogen therapy with ovarian function suppression and aromatase inhibitor therapy Patient had her wedding on 10/22/2020 ---------------------------------------------------------------------------------------------------------------------------------------- Current treatment: Completed 4 cycles of dose dense Adriamycin and Cytoxan, today cycle 4 Abraxane Echocardiogram 10/13/2020: EF 60 to 65%  Chemo toxicities: Denies any nausea or vomiting Fatigue: Mild Chemotherapy-induced anemia: Monitoring closely today's hemoglobin is 9.8 4. Allregic reaction to Taxol: Switched to Abraxane.  Today is a second treatment of Abraxane.  She tolerated first treatment of Abraxane extremely well.  She did not have any nausea vomiting fatigue or any bowel disturbances.     Monitoring closely for toxicities. Because of the slight delay in her Taxol treatments, her treatment is now going to be  finished on 03/31/2021.   No orders of the defined types were placed in this encounter.  The patient has a good understanding of the overall plan. she agrees with it. she will call with any problems that may develop before the next visit here.  Total time spent: 30 mins including face to face time and time spent for planning, charting and coordination of care  Rulon Eisenmenger, MD, MPH 02/02/2021  I, Thana Ates, am acting as scribe for Dr. Nicholas Lose.  I have reviewed the above documentation for accuracy and completeness, and I agree with the above.

## 2021-02-01 NOTE — Assessment & Plan Note (Signed)
09/22/2020: Palpable mass in the left breast corresponding to suspicious mass on mammogram measuring 3 cm, possible 5 mm satellite nodule, left axilla negative, right breast negative ER 40% weak, PR negative, HER2 negative, Ki-67 60%  Breast MRI 09/28/2020: 3.7 cm enhancing mass at 3 o'clock position left breast with a possible 7 mm satellite nodule, no abnormal lymph nodes.  Treatment plan: 1.Neoadjuvant chemotherapy with dose dense Adriamycin and Cytoxan followed by Taxol weekly x12 2.followed by mastectomy (patient's preference) 2.adjuvant radiation therapy(she will not need radiation if she has a mastectomy) 3.Followed by adjuvant antiestrogen therapy with ovarian function suppression and aromatase inhibitor therapy Patient had her wedding on 10/22/2020 ---------------------------------------------------------------------------------------------------------------------------------------- Current treatment:Completed 4 cycles ofdose dense Adriamycin and Cytoxan, today cycle 5 Abraxane Echocardiogram 10/13/2020: EF 60 to 65%  Chemo toxicities: 1. Denies any nausea or vomiting 2. Fatigue: Mild 3. Chemotherapy-induced anemia: Monitoring closely today's hemoglobin is 8.8. 4. Allregic reaction to Taxol: Switched to Abraxane.    Monitoring closely for toxicities.

## 2021-02-02 ENCOUNTER — Inpatient Hospital Stay: Payer: No Typology Code available for payment source

## 2021-02-02 ENCOUNTER — Other Ambulatory Visit: Payer: Self-pay

## 2021-02-02 ENCOUNTER — Inpatient Hospital Stay (HOSPITAL_BASED_OUTPATIENT_CLINIC_OR_DEPARTMENT_OTHER): Payer: No Typology Code available for payment source | Admitting: Hematology and Oncology

## 2021-02-02 VITALS — HR 89

## 2021-02-02 DIAGNOSIS — C50412 Malignant neoplasm of upper-outer quadrant of left female breast: Secondary | ICD-10-CM

## 2021-02-02 DIAGNOSIS — Z95828 Presence of other vascular implants and grafts: Secondary | ICD-10-CM

## 2021-02-02 DIAGNOSIS — Z5111 Encounter for antineoplastic chemotherapy: Secondary | ICD-10-CM | POA: Diagnosis not present

## 2021-02-02 DIAGNOSIS — Z170 Estrogen receptor positive status [ER+]: Secondary | ICD-10-CM | POA: Diagnosis not present

## 2021-02-02 LAB — CBC WITH DIFFERENTIAL (CANCER CENTER ONLY)
Abs Immature Granulocytes: 0.02 10*3/uL (ref 0.00–0.07)
Basophils Absolute: 0 10*3/uL (ref 0.0–0.1)
Basophils Relative: 1 %
Eosinophils Absolute: 0.1 10*3/uL (ref 0.0–0.5)
Eosinophils Relative: 2 %
HCT: 30.3 % — ABNORMAL LOW (ref 36.0–46.0)
Hemoglobin: 9.8 g/dL — ABNORMAL LOW (ref 12.0–15.0)
Immature Granulocytes: 1 %
Lymphocytes Relative: 21 %
Lymphs Abs: 0.8 10*3/uL (ref 0.7–4.0)
MCH: 27.8 pg (ref 26.0–34.0)
MCHC: 32.3 g/dL (ref 30.0–36.0)
MCV: 86.1 fL (ref 80.0–100.0)
Monocytes Absolute: 0.2 10*3/uL (ref 0.1–1.0)
Monocytes Relative: 4 %
Neutro Abs: 2.6 10*3/uL (ref 1.7–7.7)
Neutrophils Relative %: 71 %
Platelet Count: 267 10*3/uL (ref 150–400)
RBC: 3.52 MIL/uL — ABNORMAL LOW (ref 3.87–5.11)
RDW: 14.5 % (ref 11.5–15.5)
WBC Count: 3.7 10*3/uL — ABNORMAL LOW (ref 4.0–10.5)
nRBC: 0 % (ref 0.0–0.2)

## 2021-02-02 LAB — CMP (CANCER CENTER ONLY)
ALT: 59 U/L — ABNORMAL HIGH (ref 0–44)
AST: 28 U/L (ref 15–41)
Albumin: 4 g/dL (ref 3.5–5.0)
Alkaline Phosphatase: 66 U/L (ref 38–126)
Anion gap: 8 (ref 5–15)
BUN: 9 mg/dL (ref 6–20)
CO2: 26 mmol/L (ref 22–32)
Calcium: 9.3 mg/dL (ref 8.9–10.3)
Chloride: 105 mmol/L (ref 98–111)
Creatinine: 0.48 mg/dL (ref 0.44–1.00)
GFR, Estimated: >60 mL/min (ref >60)
Glucose, Bld: 118 mg/dL — ABNORMAL HIGH (ref 70–99)
Potassium: 3.8 mmol/L (ref 3.5–5.1)
Sodium: 139 mmol/L (ref 135–145)
Total Bilirubin: 0.3 mg/dL (ref 0.3–1.2)
Total Protein: 7 g/dL (ref 6.5–8.1)

## 2021-02-02 MED ORDER — PACLITAXEL PROTEIN-BOUND CHEMO INJECTION 100 MG
80.0000 mg/m2 | Freq: Once | INTRAVENOUS | Status: AC
  Administered 2021-02-02: 175 mg via INTRAVENOUS
  Filled 2021-02-02: qty 35

## 2021-02-02 MED ORDER — ONDANSETRON HCL 4 MG/2ML IJ SOLN
8.0000 mg | Freq: Once | INTRAMUSCULAR | Status: AC
  Administered 2021-02-02: 8 mg via INTRAVENOUS
  Filled 2021-02-02: qty 4

## 2021-02-02 MED ORDER — SODIUM CHLORIDE 0.9% FLUSH
10.0000 mL | INTRAVENOUS | Status: DC | PRN
  Administered 2021-02-02: 10 mL

## 2021-02-02 MED ORDER — SODIUM CHLORIDE 0.9 % IV SOLN: Freq: Once | INTRAVENOUS | Status: AC

## 2021-02-02 MED ORDER — SODIUM CHLORIDE 0.9% FLUSH
10.0000 mL | Freq: Once | INTRAVENOUS | Status: AC
  Administered 2021-02-02: 10 mL

## 2021-02-02 MED ORDER — HEPARIN SOD (PORK) LOCK FLUSH 100 UNIT/ML IV SOLN
500.0000 [IU] | Freq: Once | INTRAVENOUS | Status: AC | PRN
  Administered 2021-02-02: 500 [IU]

## 2021-02-02 NOTE — Patient Instructions (Signed)
[facility]  Discharge Instructions: Thank you for choosing [facility] to provide your oncology and hematology care.   If you have a lab appointment with the Cancer Center, please go directly to the Cancer Center and check in at the registration area.   Wear comfortable clothing and clothing appropriate for easy access to any Portacath or PICC line.   We strive to give you quality time with your provider. You may need to reschedule your appointment if you arrive late (15 or more minutes).  Arriving late affects you and other patients whose appointments are after yours.  Also, if you miss three or more appointments without notifying the office, you may be dismissed from the clinic at the provider's discretion.      For prescription refill requests, have your pharmacy contact our office and allow 72 hours for refills to be completed.    Today you received the following chemotherapy and/or immunotherapy agents abraxane      To help prevent nausea and vomiting after your treatment, we encourage you to take your nausea medication as directed.  BELOW ARE SYMPTOMS THAT SHOULD BE REPORTED IMMEDIATELY: *FEVER GREATER THAN 100.4 F (38 C) OR HIGHER *CHILLS OR SWEATING *NAUSEA AND VOMITING THAT IS NOT CONTROLLED WITH YOUR NAUSEA MEDICATION *UNUSUAL SHORTNESS OF BREATH *UNUSUAL BRUISING OR BLEEDING *URINARY PROBLEMS (pain or burning when urinating, or frequent urination) *BOWEL PROBLEMS (unusual diarrhea, constipation, pain near the anus) TENDERNESS IN MOUTH AND THROAT WITH OR WITHOUT PRESENCE OF ULCERS (sore throat, sores in mouth, or a toothache) UNUSUAL RASH, SWELLING OR PAIN  UNUSUAL VAGINAL DISCHARGE OR ITCHING   Items with * indicate a potential emergency and should be followed up as soon as possible or go to the Emergency Department if any problems should occur.  Please show the CHEMOTHERAPY ALERT CARD or IMMUNOTHERAPY ALERT CARD at check-in to  the Emergency Department and triage nurse.  Should you have questions after your visit or need to cancel or reschedule your appointment, please contact [facility]  Dept: [phone]  and follow the prompts.  Office hours are 8:00 a.m. to 4:30 p.m. Monday - Friday. Please note that voicemails left after 4:00 p.m. may not be returned until the following business day.  We are closed weekends and major holidays. You have access to a nurse at all times for urgent questions. Please call the main number to the clinic Dept: [phone] and follow the prompts.   For any non-urgent questions, you may also contact your provider using MyChart. We now offer e-Visits for anyone 18 and older to request care online for non-urgent symptoms. For details visit [email or web address].   Also download the MyChart app! Go to the app store, search "MyChart", open the app, select [facility], and log in with your MyChart username and password.  Due to Covid, a mask is required upon entering the hospital/clinic. If you do not have a mask, one will be given to you upon arrival. For doctor visits, patients may have 1 support person aged 18 or older with them. For treatment visits, patients cannot have anyone with them due to current Covid guidelines and our immunocompromised population.   

## 2021-02-08 NOTE — Progress Notes (Signed)
Patient Care Team: Kerry Dory, NP as PCP - General (Nurse Practitioner) Rockwell Germany, RN as Oncology Nurse Navigator Mauro Kaufmann, RN as Oncology Nurse Navigator Nicholas Lose, MD as Consulting Physician (Hematology and Oncology) Rolm Bookbinder, MD as Consulting Physician (General Surgery)  DIAGNOSIS:    ICD-10-CM   1. Malignant neoplasm of upper-outer quadrant of left breast in female, estrogen receptor positive ([place])  C50.412    Z17.0       SUMMARY OF ONCOLOGIC HISTORY: Oncology History  Malignant neoplasm of upper-outer quadrant of left breast in female, estrogen receptor positive ([place])  09/25/2020 Initial Diagnosis   Palpable lump in the left breast.  Mammogram revealed 3 cm tumor and a possible 5 mm satellite nodule.  Biopsy revealed grade 3 IDC ER 40% weak, PR 0%, HER2 negative, Ki-67 60%   09/25/2020 Cancer Staging   Staging form: Breast, AJCC 8th Edition - Clinical stage from 09/25/2020: Stage IIB (cT2, cN0, cM0, G3, ER+, PR-, HER2-) - Signed by Nicholas Lose, MD on 09/25/2020 Stage prefix: Initial diagnosis Histologic grading system: 3 grade system    10/02/2020 Genetic Testing   Negative hereditary cancer genetic testing: no pathogenic variants detected in [facility] BRCAPlus Panel and [facility] CancerNext-Expanded +RNAinsight Panel.  The report dates are October 02, 2020 and October 13, 2020, respectively.   The BRCAplus panel offered by [employer] and includes sequencing and deletion/duplication analysis for the following 8 genes: ATM, BRCA1, BRCA2, CDH1, CHEK2, PALB2, PTEN, and TP53.  The CancerNext-Expanded gene panel offered by [facility] and includes sequencing, rearrangement, and RNA analysis for the following 77 genes: AIP, ALK, APC, ATM, AXIN2, BAP1, BARD1, BLM, BMPR1A, BRCA1, BRCA2, BRIP1, CDC73, CDH1, CDK4, CDKN1B, CDKN2A, CHEK2, CTNNA1, DICER1, FANCC, FH, FLCN, GALNT12, KIF1B, LZTR1, MAX, MEN1, MET, MLH1, MSH2, MSH3, MSH6, MUTYH, NBN, NF1, NF2,  NTHL1, PALB2, PHOX2B, PMS2, POT1, PRKAR1A, PTCH1, PTEN, RAD51C, RAD51D, RB1, RECQL, RET, SDHA, SDHAF2, SDHB, SDHC, SDHD, SMAD4, SMARCA4, SMARCB1, SMARCE1, STK11, SUFU, TMEM127, TP53, TSC1, TSC2, VHL and XRCC2 (sequencing and deletion/duplication); EGFR, EGLN1, HOXB13, KIT, MITF, PDGFRA, POLD1, and POLE (sequencing only); EPCAM and GREM1 (deletion/duplication only).    11/03/2020 -  Chemotherapy   Patient is on Treatment Plan : BREAST ADJUVANT DOSE DENSE AC q14d / PACLitaxel switched to Abraxane (due to rxn) q7d       CHIEF COMPLIANT: Cycle 7 Taxol  INTERVAL HISTORY: Allison Dodson is a 26 y.o. with above-mentioned history of breast cancer, currently undergoing dose dense Adriamycin and Cytoxan followed by Taxol. She presents to the clinic today for a toxicity check and treatment.   ALLERGIES:  is allergic to paclitaxel.  MEDICATIONS:  Current Outpatient Medications  Medication Sig Dispense Refill   ALPRAZolam (XANAX) 0.25 MG tablet Take by mouth.     lidocaine-prilocaine (EMLA) cream Apply to affected area once 30 g 3   No current facility-administered medications for this visit.    PHYSICAL EXAMINATION: ECOG PERFORMANCE STATUS: 1 - Symptomatic but completely ambulatory  Vitals:   02/09/21 0915  BP: 138/74  Pulse: (!) 107  Resp: 18  Temp: (!) 97.4 F (36.3 C)  SpO2: 100%   Filed Weights   02/09/21 0915  Weight: 230 lb 1.6 oz (104.4 kg)    LABORATORY DATA:  I have reviewed the data as listed CMP Latest Ref Rng & Units 02/02/2021 01/26/2021 01/12/2021  Glucose 70 - 99 mg/dL 118(H) 109(H) 119(H)  BUN 6 - 20 mg/dL $Remove'9 9 8  'QAfOILW$ Creatinine 0.44 - 1.00 mg/dL 0.48 0.53  0.43(L)  Sodium 135 - 145 mmol/L 139 139 139  Potassium 3.5 - 5.1 mmol/L 3.8 3.8 3.7  Chloride 98 - 111 mmol/L 105 105 105  CO2 22 - 32 mmol/L $RemoveB'26 27 27  'qAlaAqCk$ Calcium 8.9 - 10.3 mg/dL 9.3 9.3 9.0  Total Protein 6.5 - 8.1 g/dL 7.0 7.2 6.4(L)  Total Bilirubin 0.3 - 1.2 mg/dL 0.3 0.3 0.4  Alkaline Phos 38 - 126 U/L 66 64 53   AST 15 - 41 U/L $Remo'28 28 22  'OSABg$ ALT 0 - 44 U/L 59(H) 45(H) 46(H)    Lab Results  Component Value Date   WBC 3.3 (L) 02/09/2021   HGB 10.0 (L) 02/09/2021   HCT 30.1 (L) 02/09/2021   MCV 87.0 02/09/2021   PLT 318 02/09/2021   NEUTROABS 2.2 02/09/2021    ASSESSMENT & PLAN:  Malignant neoplasm of upper-outer quadrant of left breast in female, estrogen receptor positive ([place]) 09/22/2020: Palpable mass in the left breast corresponding to suspicious mass on mammogram measuring 3 cm, possible 5 mm satellite nodule, left axilla negative, right breast negative ER 40% weak, PR negative, HER2 negative, Ki-67 60%   Breast MRI 09/28/2020: 3.7 cm enhancing mass at 3 o'clock position left breast with a possible 7 mm satellite nodule, no abnormal lymph nodes.   Treatment plan: 1.  Neoadjuvant chemotherapy with dose dense Adriamycin and Cytoxan followed by Taxol weekly x12 2. followed by mastectomy (patient's preference) 2. adjuvant radiation therapy (she will not need radiation if she has a mastectomy) 3.  Followed by adjuvant antiestrogen therapy with ovarian function suppression and aromatase inhibitor therapy Patient had her wedding on 10/22/2020 ---------------------------------------------------------------------------------------------------------------------------------------- Current treatment: Completed 4 cycles of dose dense Adriamycin and Cytoxan, today cycle 7 Abraxane Echocardiogram 10/13/2020: EF 60 to 65%   Chemo toxicities: Denies any nausea or vomiting Fatigue: Mild Chemotherapy-induced anemia: Monitoring closely today's hemoglobin is 9.8 4. Allregic reaction to Taxol: Switched to Abraxane.  Today is a second treatment of Abraxane.  She tolerated first treatment of Abraxane extremely well.  She did not have any nausea vomiting fatigue or any bowel disturbances. 5.  Leukopenia: ANC 2.2: We will watch this closely.  If her ANC drops below 2 then we will reduce the dosage of Abraxane.      Monitoring closely for toxicities. Because of the slight delay in her Taxol treatments, her treatment is now going to be finished on 03/31/2021.    No orders of the defined types were placed in this encounter.  The patient has a good understanding of the overall plan. she agrees with it. she will call with any problems that may develop before the next visit here.  Total time spent: 30 mins including face to face time and time spent for planning, charting and coordination of care  Rulon Eisenmenger, MD, MPH 02/09/2021  I, Thana Ates, am acting as scribe for Dr. Nicholas Lose.  I have reviewed the above documentation for accuracy and completeness, and I agree with the above.

## 2021-02-09 ENCOUNTER — Inpatient Hospital Stay: Payer: No Typology Code available for payment source

## 2021-02-09 ENCOUNTER — Other Ambulatory Visit: Payer: Self-pay

## 2021-02-09 ENCOUNTER — Inpatient Hospital Stay (HOSPITAL_BASED_OUTPATIENT_CLINIC_OR_DEPARTMENT_OTHER): Payer: No Typology Code available for payment source | Admitting: Hematology and Oncology

## 2021-02-09 ENCOUNTER — Encounter: Payer: Self-pay | Admitting: *Deleted

## 2021-02-09 VITALS — HR 94 | Temp 98.6°F

## 2021-02-09 DIAGNOSIS — Z170 Estrogen receptor positive status [ER+]: Secondary | ICD-10-CM | POA: Diagnosis not present

## 2021-02-09 DIAGNOSIS — C50412 Malignant neoplasm of upper-outer quadrant of left female breast: Secondary | ICD-10-CM

## 2021-02-09 DIAGNOSIS — Z5111 Encounter for antineoplastic chemotherapy: Secondary | ICD-10-CM | POA: Diagnosis not present

## 2021-02-09 DIAGNOSIS — Z95828 Presence of other vascular implants and grafts: Secondary | ICD-10-CM

## 2021-02-09 LAB — CBC WITH DIFFERENTIAL (CANCER CENTER ONLY)
Abs Immature Granulocytes: 0.01 10*3/uL (ref 0.00–0.07)
Basophils Absolute: 0 10*3/uL (ref 0.0–0.1)
Basophils Relative: 1 %
Eosinophils Absolute: 0.1 10*3/uL (ref 0.0–0.5)
Eosinophils Relative: 2 %
HCT: 30.1 % — ABNORMAL LOW (ref 36.0–46.0)
Hemoglobin: 10 g/dL — ABNORMAL LOW (ref 12.0–15.0)
Immature Granulocytes: 0 %
Lymphocytes Relative: 23 %
Lymphs Abs: 0.7 10*3/uL (ref 0.7–4.0)
MCH: 28.9 pg (ref 26.0–34.0)
MCHC: 33.2 g/dL (ref 30.0–36.0)
MCV: 87 fL (ref 80.0–100.0)
Monocytes Absolute: 0.3 10*3/uL (ref 0.1–1.0)
Monocytes Relative: 8 %
Neutro Abs: 2.2 10*3/uL (ref 1.7–7.7)
Neutrophils Relative %: 66 %
Platelet Count: 318 10*3/uL (ref 150–400)
RBC: 3.46 MIL/uL — ABNORMAL LOW (ref 3.87–5.11)
RDW: 14.6 % (ref 11.5–15.5)
WBC Count: 3.3 10*3/uL — ABNORMAL LOW (ref 4.0–10.5)
nRBC: 0 % (ref 0.0–0.2)

## 2021-02-09 LAB — CMP (CANCER CENTER ONLY)
ALT: 49 U/L — ABNORMAL HIGH (ref 0–44)
AST: 28 U/L (ref 15–41)
Albumin: 4.1 g/dL (ref 3.5–5.0)
Alkaline Phosphatase: 70 U/L (ref 38–126)
Anion gap: 7 (ref 5–15)
BUN: 11 mg/dL (ref 6–20)
CO2: 26 mmol/L (ref 22–32)
Calcium: 9.1 mg/dL (ref 8.9–10.3)
Chloride: 106 mmol/L (ref 98–111)
Creatinine: 0.51 mg/dL (ref 0.44–1.00)
GFR, Estimated: >60 mL/min (ref >60)
Glucose, Bld: 112 mg/dL — ABNORMAL HIGH (ref 70–99)
Potassium: 4 mmol/L (ref 3.5–5.1)
Sodium: 139 mmol/L (ref 135–145)
Total Bilirubin: 0.4 mg/dL (ref 0.3–1.2)
Total Protein: 7.1 g/dL (ref 6.5–8.1)

## 2021-02-09 MED ORDER — SODIUM CHLORIDE 0.9% FLUSH
10.0000 mL | Freq: Once | INTRAVENOUS | Status: AC
  Administered 2021-02-09: 09:00:00 10 mL

## 2021-02-09 MED ORDER — PACLITAXEL PROTEIN-BOUND CHEMO INJECTION 100 MG
80.0000 mg/m2 | Freq: Once | INTRAVENOUS | Status: AC
  Administered 2021-02-09: 12:00:00 175 mg via INTRAVENOUS
  Filled 2021-02-09: qty 35

## 2021-02-09 MED ORDER — HEPARIN SOD (PORK) LOCK FLUSH 100 UNIT/ML IV SOLN
500.0000 [IU] | Freq: Once | INTRAVENOUS | Status: AC | PRN
  Administered 2021-02-09: 13:00:00 500 [IU]

## 2021-02-09 MED ORDER — SODIUM CHLORIDE 0.9 % IV SOLN: Freq: Once | INTRAVENOUS | Status: AC

## 2021-02-09 MED ORDER — ONDANSETRON HCL 4 MG/2ML IJ SOLN
8.0000 mg | Freq: Once | INTRAMUSCULAR | Status: AC
  Administered 2021-02-09: 11:00:00 8 mg via INTRAVENOUS
  Filled 2021-02-09: qty 4

## 2021-02-09 MED ORDER — SODIUM CHLORIDE 0.9% FLUSH
10.0000 mL | INTRAVENOUS | Status: DC | PRN
  Administered 2021-02-09: 13:00:00 10 mL

## 2021-02-09 NOTE — Assessment & Plan Note (Signed)
09/22/2020: Palpable mass in the left breast corresponding to suspicious mass on mammogram measuring 3 cm, possible 5 mm satellite nodule, left axilla negative, right breast negative °ER 40% weak, PR negative, HER2 negative, Ki-67 60% °  °Breast MRI 09/28/2020: 3.7 cm enhancing mass at 3 o'clock position left breast with a possible 7 mm satellite nodule, no abnormal lymph nodes. °  °Treatment plan: °1.  Neoadjuvant chemotherapy with dose dense Adriamycin and Cytoxan followed by Taxol weekly x12 °2. followed by mastectomy (patient's preference) °2. adjuvant radiation therapy (she will not need radiation if she has a mastectomy) °3.  Followed by adjuvant antiestrogen therapy with ovarian function suppression and aromatase inhibitor therapy °Patient had her wedding on 10/22/2020 °---------------------------------------------------------------------------------------------------------------------------------------- °Current treatment: Completed 4 cycles of dose dense Adriamycin and Cytoxan, today cycle 6 Abraxane °Echocardiogram 10/13/2020: EF 60 to 65% °  °Chemo toxicities: °1. Denies any nausea or vomiting °2. Fatigue: Mild °3. Chemotherapy-induced anemia: Monitoring closely today's hemoglobin is 9.8 °4. Allregic reaction to Taxol: Switched to Abraxane.  Today is a second treatment of Abraxane.  She tolerated first treatment of Abraxane extremely well.  She did not have any nausea vomiting fatigue or any bowel disturbances. °  °  °Monitoring closely for toxicities. °Because of the slight delay in her Taxol treatments, her treatment is now going to be finished on 03/31/2021. °

## 2021-02-09 NOTE — Patient Instructions (Signed)
[facility]  Discharge Instructions: Thank you for choosing [facility] to provide your oncology and hematology care.   If you have a lab appointment with the [facility], please go directly to the [facility] and check in at the registration area.   Wear comfortable clothing and clothing appropriate for easy access to any Portacath or PICC line.   We strive to give you quality time with your provider. You may need to reschedule your appointment if you arrive late (15 or more minutes).  Arriving late affects you and other patients whose appointments are after yours.  Also, if you miss three or more appointments without notifying the office, you may be dismissed from the clinic at the providers discretion.      For prescription refill requests, have your pharmacy contact our office and allow 72 hours for refills to be completed.    Today you received the following chemotherapy and/or immunotherapy agents: Paclitaxel (Abraxane).       To help prevent nausea and vomiting after your treatment, we encourage you to take your nausea medication as directed.  BELOW ARE SYMPTOMS THAT SHOULD BE REPORTED IMMEDIATELY: *FEVER GREATER THAN 100.4 F (38 C) OR HIGHER *CHILLS OR SWEATING *NAUSEA AND VOMITING THAT IS NOT CONTROLLED WITH YOUR NAUSEA MEDICATION *UNUSUAL SHORTNESS OF BREATH *UNUSUAL BRUISING OR BLEEDING *URINARY PROBLEMS (pain or burning when urinating, or frequent urination) *BOWEL PROBLEMS (unusual diarrhea, constipation, pain near the anus) TENDERNESS IN MOUTH AND THROAT WITH OR WITHOUT PRESENCE OF ULCERS (sore throat, sores in mouth, or a toothache) UNUSUAL RASH, SWELLING OR PAIN  UNUSUAL VAGINAL DISCHARGE OR ITCHING   Items with * indicate a potential emergency and should be followed up as soon as possible or go to the Emergency Department if any problems should occur.  Please show the CHEMOTHERAPY ALERT CARD or IMMUNOTHERAPY ALERT CARD  at check-in to the Emergency Department and triage nurse.  Should you have questions after your visit or need to cancel or reschedule your appointment, please contact [facility]  Dept: [phone]  and follow the prompts.  Office hours are 8:00 a.m. to 4:30 p.m. Monday - Friday. Please note that voicemails left after 4:00 p.m. may not be returned until the following business day.  We are closed weekends and major holidays. You have access to a nurse at all times for urgent questions. Please call the main number to the clinic Dept: [phone] and follow the prompts.   For any non-urgent questions, you may also contact your provider using MyChart. We now offer e-Visits for anyone 51 and older to request care online for non-urgent symptoms. For details visit [email or web address].   Also download the MyChart app! Go to the app store, search "MyChart", open the app, select [facility], and log in with your MyChart username and password.  Due to Covid, a mask is required upon entering the hospital/clinic. If you do not have a mask, one will be given to you upon arrival. For doctor visits, patients may have 1 support person aged 60 or older with them. For treatment visits, patients cannot have anyone with them due to current Covid guidelines and our immunocompromised population.

## 2021-02-16 ENCOUNTER — Inpatient Hospital Stay: Payer: No Typology Code available for payment source

## 2021-02-16 ENCOUNTER — Other Ambulatory Visit: Payer: Self-pay

## 2021-02-16 ENCOUNTER — Encounter: Payer: Self-pay | Admitting: *Deleted

## 2021-02-16 ENCOUNTER — Other Ambulatory Visit: Payer: Self-pay | Admitting: *Deleted

## 2021-02-16 VITALS — BP 117/72 | HR 93 | Temp 98.6°F | Resp 18

## 2021-02-16 DIAGNOSIS — Z170 Estrogen receptor positive status [ER+]: Secondary | ICD-10-CM

## 2021-02-16 DIAGNOSIS — C50412 Malignant neoplasm of upper-outer quadrant of left female breast: Secondary | ICD-10-CM

## 2021-02-16 DIAGNOSIS — Z95828 Presence of other vascular implants and grafts: Secondary | ICD-10-CM

## 2021-02-16 DIAGNOSIS — Z5111 Encounter for antineoplastic chemotherapy: Secondary | ICD-10-CM | POA: Diagnosis not present

## 2021-02-16 LAB — CMP (CANCER CENTER ONLY)
ALT: 43 U/L (ref 0–44)
AST: 24 U/L (ref 15–41)
Albumin: 4.2 g/dL (ref 3.5–5.0)
Alkaline Phosphatase: 63 U/L (ref 38–126)
Anion gap: 6 (ref 5–15)
BUN: 8 mg/dL (ref 6–20)
CO2: 28 mmol/L (ref 22–32)
Calcium: 9.3 mg/dL (ref 8.9–10.3)
Chloride: 106 mmol/L (ref 98–111)
Creatinine: 0.51 mg/dL (ref 0.44–1.00)
GFR, Estimated: >60 mL/min (ref >60)
Glucose, Bld: 114 mg/dL — ABNORMAL HIGH (ref 70–99)
Potassium: 4 mmol/L (ref 3.5–5.1)
Sodium: 140 mmol/L (ref 135–145)
Total Bilirubin: 0.3 mg/dL (ref 0.3–1.2)
Total Protein: 7.2 g/dL (ref 6.5–8.1)

## 2021-02-16 LAB — CBC WITH DIFFERENTIAL (CANCER CENTER ONLY)
Abs Immature Granulocytes: 0.03 10*3/uL (ref 0.00–0.07)
Basophils Absolute: 0 10*3/uL (ref 0.0–0.1)
Basophils Relative: 1 %
Eosinophils Absolute: 0 10*3/uL (ref 0.0–0.5)
Eosinophils Relative: 1 %
HCT: 31.2 % — ABNORMAL LOW (ref 36.0–46.0)
Hemoglobin: 10.2 g/dL — ABNORMAL LOW (ref 12.0–15.0)
Immature Granulocytes: 1 %
Lymphocytes Relative: 24 %
Lymphs Abs: 0.8 10*3/uL (ref 0.7–4.0)
MCH: 28.7 pg (ref 26.0–34.0)
MCHC: 32.7 g/dL (ref 30.0–36.0)
MCV: 87.6 fL (ref 80.0–100.0)
Monocytes Absolute: 0.2 10*3/uL (ref 0.1–1.0)
Monocytes Relative: 7 %
Neutro Abs: 2.1 10*3/uL (ref 1.7–7.7)
Neutrophils Relative %: 66 %
Platelet Count: 322 10*3/uL (ref 150–400)
RBC: 3.56 MIL/uL — ABNORMAL LOW (ref 3.87–5.11)
RDW: 14.9 % (ref 11.5–15.5)
WBC Count: 3.2 10*3/uL — ABNORMAL LOW (ref 4.0–10.5)
nRBC: 0 % (ref 0.0–0.2)

## 2021-02-16 MED ORDER — PACLITAXEL PROTEIN-BOUND CHEMO INJECTION 100 MG
80.0000 mg/m2 | Freq: Once | INTRAVENOUS | Status: AC
  Administered 2021-02-16: 175 mg via INTRAVENOUS
  Filled 2021-02-16: qty 35

## 2021-02-16 MED ORDER — SODIUM CHLORIDE 0.9 % IV SOLN: Freq: Once | INTRAVENOUS | Status: AC

## 2021-02-16 MED ORDER — SODIUM CHLORIDE 0.9% FLUSH
10.0000 mL | Freq: Once | INTRAVENOUS | Status: AC
  Administered 2021-02-16: 10 mL

## 2021-02-16 MED ORDER — ONDANSETRON HCL 4 MG/2ML IJ SOLN
8.0000 mg | Freq: Once | INTRAMUSCULAR | Status: AC
  Administered 2021-02-16: 8 mg via INTRAVENOUS
  Filled 2021-02-16: qty 4

## 2021-02-16 MED ORDER — HEPARIN SOD (PORK) LOCK FLUSH 100 UNIT/ML IV SOLN
500.0000 [IU] | Freq: Once | INTRAVENOUS | Status: AC | PRN
  Administered 2021-02-16: 500 [IU]

## 2021-02-16 MED ORDER — SODIUM CHLORIDE 0.9% FLUSH
10.0000 mL | INTRAVENOUS | Status: DC | PRN
  Administered 2021-02-16: 10 mL

## 2021-02-16 MED ORDER — ALTEPLASE 2 MG IJ SOLR
2.0000 mg | Freq: Once | INTRAMUSCULAR | Status: AC
  Administered 2021-02-16: 2 mg
  Filled 2021-02-16: qty 2

## 2021-02-16 NOTE — Patient Instructions (Signed)
[facility]ICAL [facility]COLOG[facility]  Discharge Instructions: Thank you for choosing [facility] to provide your oncology and hematology care.   If you have a lab appointment with the Cancer Center, please go directly to the Cancer Center and check in at the registration area.   Wear comfortable clothing and clothing appropriate for easy access to any Portacath or PICC line.   We strive to give you quality time with your provider. You may need to reschedule your appointment if you arrive late (15 or more minutes).  Arriving late affects you and other patients whose appointments are after yours.  Also, if you miss three or more appointments without notifying the office, you may be dismissed from the clinic at the provider's discretion.      For prescription refill requests, have your pharmacy contact our office and allow 72 hours for refills to be completed.    Today you received the following chemotherapy and/or immunotherapy agents: Abraxane.      To help prevent nausea and vomiting after your treatment, we encourage you to take your nausea medication as directed.  BELOW ARE SYMPTOMS THAT SHOULD BE REPORTED IMMEDIATELY: *FEVER GREATER THAN 100.4 F (38 C) OR HIGHER *CHILLS OR SWEATING *NAUSEA AND VOMITING THAT IS NOT CONTROLLED WITH YOUR NAUSEA MEDICATION *UNUSUAL SHORTNESS OF BREATH *UNUSUAL BRUISING OR BLEEDING *URINARY PROBLEMS (pain or burning when urinating, or frequent urination) *BOWEL PROBLEMS (unusual diarrhea, constipation, pain near the anus) TENDERNESS IN MOUTH AND THROAT WITH OR WITHOUT PRESENCE OF ULCERS (sore throat, sores in mouth, or a toothache) UNUSUAL RASH, SWELLING OR PAIN  UNUSUAL VAGINAL DISCHARGE OR ITCHING   Items with * indicate a potential emergency and should be followed up as soon as possible or go to the Emergency Department if any problems should occur.  Please show the CHEMOTHERAPY ALERT CARD or IMMUNOTHERAPY ALERT CARD at check-in to  the Emergency Department and triage nurse.  Should you have questions after your visit or need to cancel or reschedule your appointment, please contact [facility]  Dept: [phone]  and follow the prompts.  Office hours are 8:00 a.m. to 4:30 p.m. Monday - Friday. Please note that voicemails left after 4:00 p.m. may not be returned until the following business day.  We are closed weekends and major holidays. You have access to a nurse at all times for urgent questions. Please call the main number to the clinic Dept: [phone] and follow the prompts.   For any non-urgent questions, you may also contact your provider using MyChart. We now offer e-Visits for anyone 18 and older to request care online for non-urgent symptoms. For details visit [email or web address].   Also download the MyChart app! Go to the app store, search "MyChart", open the app, select [facility], and log in with your MyChart username and password.  Due to Covid, a mask is required upon entering the hospital/clinic. If you do not have a mask, one will be given to you upon arrival. For doctor visits, patients may have 1 support person aged 18 or older with them. For treatment visits, patients cannot have anyone with them due to current Covid guidelines and our immunocompromised population.   

## 2021-02-18 ENCOUNTER — Telehealth: Payer: Self-pay | Admitting: *Deleted

## 2021-02-18 NOTE — Telephone Encounter (Signed)
Received appt for pt to see Dr. Donne Hazel on 2/24 arrive at 10:45am. Left vm with appt date and time.

## 2021-02-23 ENCOUNTER — Inpatient Hospital Stay (HOSPITAL_BASED_OUTPATIENT_CLINIC_OR_DEPARTMENT_OTHER): Payer: No Typology Code available for payment source | Admitting: Adult Health

## 2021-02-23 ENCOUNTER — Encounter: Payer: Self-pay | Admitting: Adult Health

## 2021-02-23 ENCOUNTER — Inpatient Hospital Stay: Payer: No Typology Code available for payment source | Attending: Hematology and Oncology

## 2021-02-23 ENCOUNTER — Other Ambulatory Visit: Payer: Self-pay

## 2021-02-23 ENCOUNTER — Inpatient Hospital Stay: Payer: No Typology Code available for payment source

## 2021-02-23 VITALS — BP 137/67 | HR 102 | Temp 97.5°F | Resp 18 | Ht 63.0 in | Wt 232.9 lb

## 2021-02-23 VITALS — HR 98

## 2021-02-23 DIAGNOSIS — C50412 Malignant neoplasm of upper-outer quadrant of left female breast: Secondary | ICD-10-CM | POA: Insufficient documentation

## 2021-02-23 DIAGNOSIS — Z170 Estrogen receptor positive status [ER+]: Secondary | ICD-10-CM | POA: Diagnosis not present

## 2021-02-23 DIAGNOSIS — T451X5A Adverse effect of antineoplastic and immunosuppressive drugs, initial encounter: Secondary | ICD-10-CM | POA: Insufficient documentation

## 2021-02-23 DIAGNOSIS — D6481 Anemia due to antineoplastic chemotherapy: Secondary | ICD-10-CM | POA: Insufficient documentation

## 2021-02-23 DIAGNOSIS — Z800 Family history of malignant neoplasm of digestive organs: Secondary | ICD-10-CM | POA: Diagnosis not present

## 2021-02-23 DIAGNOSIS — Z803 Family history of malignant neoplasm of breast: Secondary | ICD-10-CM | POA: Insufficient documentation

## 2021-02-23 DIAGNOSIS — Z801 Family history of malignant neoplasm of trachea, bronchus and lung: Secondary | ICD-10-CM | POA: Diagnosis not present

## 2021-02-23 DIAGNOSIS — Z79899 Other long term (current) drug therapy: Secondary | ICD-10-CM | POA: Insufficient documentation

## 2021-02-23 DIAGNOSIS — R5383 Other fatigue: Secondary | ICD-10-CM | POA: Diagnosis not present

## 2021-02-23 DIAGNOSIS — Z5111 Encounter for antineoplastic chemotherapy: Secondary | ICD-10-CM | POA: Insufficient documentation

## 2021-02-23 DIAGNOSIS — Z95828 Presence of other vascular implants and grafts: Secondary | ICD-10-CM

## 2021-02-23 LAB — CBC WITH DIFFERENTIAL (CANCER CENTER ONLY)
Abs Immature Granulocytes: 0.05 10*3/uL (ref 0.00–0.07)
Basophils Absolute: 0 10*3/uL (ref 0.0–0.1)
Basophils Relative: 1 %
Eosinophils Absolute: 0 10*3/uL (ref 0.0–0.5)
Eosinophils Relative: 1 %
HCT: 29.6 % — ABNORMAL LOW (ref 36.0–46.0)
Hemoglobin: 10.2 g/dL — ABNORMAL LOW (ref 12.0–15.0)
Immature Granulocytes: 1 %
Lymphocytes Relative: 29 %
Lymphs Abs: 1.1 10*3/uL (ref 0.7–4.0)
MCH: 29.7 pg (ref 26.0–34.0)
MCHC: 34.5 g/dL (ref 30.0–36.0)
MCV: 86 fL (ref 80.0–100.0)
Monocytes Absolute: 0.2 10*3/uL (ref 0.1–1.0)
Monocytes Relative: 5 %
Neutro Abs: 2.4 10*3/uL (ref 1.7–7.7)
Neutrophils Relative %: 63 %
Platelet Count: 299 10*3/uL (ref 150–400)
RBC: 3.44 MIL/uL — ABNORMAL LOW (ref 3.87–5.11)
RDW: 15 % (ref 11.5–15.5)
WBC Count: 3.7 10*3/uL — ABNORMAL LOW (ref 4.0–10.5)
nRBC: 0.5 % — ABNORMAL HIGH (ref 0.0–0.2)

## 2021-02-23 LAB — CMP (CANCER CENTER ONLY)
ALT: 42 U/L (ref 0–44)
AST: 23 U/L (ref 15–41)
Albumin: 4 g/dL (ref 3.5–5.0)
Alkaline Phosphatase: 65 U/L (ref 38–126)
Anion gap: 8 (ref 5–15)
BUN: 9 mg/dL (ref 6–20)
CO2: 26 mmol/L (ref 22–32)
Calcium: 9.3 mg/dL (ref 8.9–10.3)
Chloride: 105 mmol/L (ref 98–111)
Creatinine: 0.46 mg/dL (ref 0.44–1.00)
GFR, Estimated: >60 mL/min (ref >60)
Glucose, Bld: 134 mg/dL — ABNORMAL HIGH (ref 70–99)
Potassium: 3.8 mmol/L (ref 3.5–5.1)
Sodium: 139 mmol/L (ref 135–145)
Total Bilirubin: 0.3 mg/dL (ref 0.3–1.2)
Total Protein: 6.9 g/dL (ref 6.5–8.1)

## 2021-02-23 MED ORDER — SODIUM CHLORIDE 0.9 % IV SOLN: Freq: Once | INTRAVENOUS | Status: AC

## 2021-02-23 MED ORDER — SODIUM CHLORIDE 0.9% FLUSH
10.0000 mL | Freq: Once | INTRAVENOUS | Status: AC
  Administered 2021-02-23: 10 mL

## 2021-02-23 MED ORDER — ONDANSETRON HCL 4 MG/2ML IJ SOLN
8.0000 mg | Freq: Once | INTRAMUSCULAR | Status: AC
  Administered 2021-02-23: 8 mg via INTRAVENOUS
  Filled 2021-02-23: qty 4

## 2021-02-23 MED ORDER — PACLITAXEL PROTEIN-BOUND CHEMO INJECTION 100 MG
80.0000 mg/m2 | Freq: Once | INTRAVENOUS | Status: AC
  Administered 2021-02-23: 175 mg via INTRAVENOUS
  Filled 2021-02-23: qty 35

## 2021-02-23 NOTE — Patient Instructions (Signed)
[facility]  Discharge Instructions: Thank you for choosing [facility] to provide your oncology and hematology care.   If you have a lab appointment with the Cancer Center, please go directly to the Cancer Center and check in at the registration area.   Wear comfortable clothing and clothing appropriate for easy access to any Portacath or PICC line.   We strive to give you quality time with your provider. You may need to reschedule your appointment if you arrive late (15 or more minutes).  Arriving late affects you and other patients whose appointments are after yours.  Also, if you miss three or more appointments without notifying the office, you may be dismissed from the clinic at the provider's discretion.      For prescription refill requests, have your pharmacy contact our office and allow 72 hours for refills to be completed.    Today you received the following chemotherapy and/or immunotherapy agents abraxane      To help prevent nausea and vomiting after your treatment, we encourage you to take your nausea medication as directed.  BELOW ARE SYMPTOMS THAT SHOULD BE REPORTED IMMEDIATELY: *FEVER GREATER THAN 100.4 F (38 C) OR HIGHER *CHILLS OR SWEATING *NAUSEA AND VOMITING THAT IS NOT CONTROLLED WITH YOUR NAUSEA MEDICATION *UNUSUAL SHORTNESS OF BREATH *UNUSUAL BRUISING OR BLEEDING *URINARY PROBLEMS (pain or burning when urinating, or frequent urination) *BOWEL PROBLEMS (unusual diarrhea, constipation, pain near the anus) TENDERNESS IN MOUTH AND THROAT WITH OR WITHOUT PRESENCE OF ULCERS (sore throat, sores in mouth, or a toothache) UNUSUAL RASH, SWELLING OR PAIN  UNUSUAL VAGINAL DISCHARGE OR ITCHING   Items with * indicate a potential emergency and should be followed up as soon as possible or go to the Emergency Department if any problems should occur.  Please show the CHEMOTHERAPY ALERT CARD or IMMUNOTHERAPY ALERT CARD at check-in to  the Emergency Department and triage nurse.  Should you have questions after your visit or need to cancel or reschedule your appointment, please contact [facility]  Dept: [phone]  and follow the prompts.  Office hours are 8:00 a.m. to 4:30 p.m. Monday - Friday. Please note that voicemails left after 4:00 p.m. may not be returned until the following business day.  We are closed weekends and major holidays. You have access to a nurse at all times for urgent questions. Please call the main number to the clinic Dept: [phone] and follow the prompts.   For any non-urgent questions, you may also contact your provider using MyChart. We now offer e-Visits for anyone 18 and older to request care online for non-urgent symptoms. For details visit [email or web address].   Also download the MyChart app! Go to the app store, search "MyChart", open the app, select [facility], and log in with your MyChart username and password.  Due to Covid, a mask is required upon entering the hospital/clinic. If you do not have a mask, one will be given to you upon arrival. For doctor visits, patients may have 1 support person aged 18 or older with them. For treatment visits, patients cannot have anyone with them due to current Covid guidelines and our immunocompromised population.   

## 2021-02-23 NOTE — Assessment & Plan Note (Addendum)
09/22/2020: Palpable mass in the left breast corresponding to suspicious mass on mammogram measuring 3 cm, possible 5 mm satellite nodule, left axilla negative, right breast negative ER 40% weak, PR negative, HER2 negative, Ki-67 60%  Breast MRI 09/28/2020: 3.7 cm enhancing mass at 3 o'clock position left breast with a possible 7 mm satellite nodule, no abnormal lymph nodes.  Treatment plan: 1.Neoadjuvant chemotherapy with dose dense Adriamycin and Cytoxan followed by Taxol weekly x12 2.followed by mastectomy (patient's preference) 2.adjuvant radiation therapy(she will not need radiation if she has a mastectomy) 3.Followed by adjuvant antiestrogen therapy with ovarian function suppression and aromatase inhibitor therapy Patient had her wedding on 10/22/2020 ---------------------------------------------------------------------------------------------------------------------------------------- Current treatment:Completed 4 cycles ofdose dense Adriamycin and Cytoxan, today cycle8Abraxane Echocardiogram 10/13/2020: EF 60 to 65%  Chemo toxicities: 1. Fatigue: Mild 2. Chemotherapy-induced anemia: Monitoring closely today's hemoglobin is 10.2 3.  Allergic reaction to Taxol: Switched to Abraxane.  tolerating well no signs of peripheral neuropathy.  Allison Dodson is doing well today and will proceed with treatment.  I reviewed all her labs with her in detail.  She is scheduled for MRI on February 28.  She is unsure where her visit will occur with Dr. Donne Hazel.  I gave her Dr. Cristal Generous office phone number and office address where her surgical planning visit will take place.

## 2021-02-23 NOTE — Progress Notes (Signed)
[facility] Follow up:    Allison Dory, NP [address] [place] [address]   DIAGNOSIS:  Cancer Staging  Malignant neoplasm of upper-outer quadrant of left breast in female, estrogen receptor positive ([place]) Staging form: Breast, AJCC 8th Edition - Clinical stage from 09/25/2020: Stage IIB (cT2, cN0, cM0, G3, ER+, PR-, HER2-) - Signed by Nicholas Lose, MD on 09/25/2020 Stage prefix: Initial diagnosis Histologic grading system: 3 grade system   SUMMARY OF ONCOLOGIC HISTORY: Oncology History  Malignant neoplasm of upper-outer quadrant of left breast in female, estrogen receptor positive ([place])  09/25/2020 Initial Diagnosis   Palpable lump in the left breast.  Mammogram revealed 3 cm tumor and a possible 5 mm satellite nodule.  Biopsy revealed grade 3 IDC ER 40% weak, PR 0%, HER2 negative, Ki-67 60%   09/25/2020 Cancer Staging   Staging form: Breast, AJCC 8th Edition - Clinical stage from 09/25/2020: Stage IIB (cT2, cN0, cM0, G3, ER+, PR-, HER2-) - Signed by Nicholas Lose, MD on 09/25/2020 Stage prefix: Initial diagnosis Histologic grading system: 3 grade system    10/02/2020 Genetic Testing   Negative hereditary cancer genetic testing: no pathogenic variants detected in [facility] BRCAPlus Panel and [facility] CancerNext-Expanded +RNAinsight Panel.  The report dates are October 02, 2020 and October 13, 2020, respectively.   The BRCAplus panel offered by [employer] and includes sequencing and deletion/duplication analysis for the following 8 genes: ATM, BRCA1, BRCA2, CDH1, CHEK2, PALB2, PTEN, and TP53.  The CancerNext-Expanded gene panel offered by [facility] and includes sequencing, rearrangement, and RNA analysis for the following 77 genes: AIP, ALK, APC, ATM, AXIN2, BAP1, BARD1, BLM, BMPR1A, BRCA1, BRCA2, BRIP1, CDC73, CDH1, CDK4, CDKN1B, CDKN2A, CHEK2, CTNNA1, DICER1, FANCC, FH, FLCN, GALNT12, KIF1B, LZTR1, MAX, MEN1, MET, MLH1, MSH2, MSH3, MSH6,  MUTYH, NBN, NF1, NF2, NTHL1, PALB2, PHOX2B, PMS2, POT1, PRKAR1A, PTCH1, PTEN, RAD51C, RAD51D, RB1, RECQL, RET, SDHA, SDHAF2, SDHB, SDHC, SDHD, SMAD4, SMARCA4, SMARCB1, SMARCE1, STK11, SUFU, TMEM127, TP53, TSC1, TSC2, VHL and XRCC2 (sequencing and deletion/duplication); EGFR, EGLN1, HOXB13, KIT, MITF, PDGFRA, POLD1, and POLE (sequencing only); EPCAM and GREM1 (deletion/duplication only).    11/03/2020 -  Chemotherapy   Patient is on Treatment Plan : BREAST ADJUVANT DOSE DENSE AC q14d / PACLitaxel switched to Abraxane (due to rxn) q7d       CURRENT THERAPY: Week 8 Abraxane  INTERVAL HISTORY: Allison Dodson 26 y.o. female returns for evaluation prior to receiving neoadjuvant Abraxane chemotherapy.  She continues to tolerate this well.  She notes that with her first regimen with Adriamycin and Cytoxan she had significant constipation however with this regimen she has not noticed any significant side effects.  She is mildly fatigued however she does rest and short breaks do help alleviate this tiredness.  She denies peripheral neuropathy and otherwise is doing well today.  Allison Dodson notes that she has noted no clinical progression of her breast cancer.  She says that she felt it last before she started the second regimen of chemotherapy and the mass was smaller.   Patient Active Problem List   Diagnosis Date Noted   Port-A-Cath in place 12/29/2020   Genetic testing 10/05/2020   Malignant neoplasm of upper-outer quadrant of left breast in female, estrogen receptor positive ([place]) 09/25/2020   Family history of breast cancer 09/25/2020    is allergic to paclitaxel.  MEDICAL HISTORY: Past Medical History:  Diagnosis Date   Cancer [facility])    Family history of breast cancer 09/25/2020  SURGICAL HISTORY: Past Surgical History:  Procedure Laterality Date   PORTACATH PLACEMENT N/A 11/02/2020   Procedure: INSERTION PORT-A-CATH;  Surgeon: Rolm Bookbinder, MD;  Location: [facility] ORS;  Service: General;   Laterality: N/A;    SOCIAL HISTORY: Social History   Socioeconomic History   Marital status: Married    Spouse name: Not on file   Number of children: Not on file   Years of education: Not on file   Highest education level: Not on file  Occupational History   Not on file  Tobacco Use   Smoking status: Never   Smokeless tobacco: Never  Vaping Use   Vaping Use: Never used  Substance and Sexual Activity   Alcohol use: Yes    Comment: soc.   Drug use: Never   Sexual activity: Not on file  Other Topics Concern   Not on file  Social History Narrative   Not on file   Social Determinants of Health   Financial Resource Strain: Not on file  Food Insecurity: Not on file  Transportation Needs: Not on file  Physical Activity: Not on file  Stress: Not on file  Social Connections: Not on file  Intimate Partner Violence: Not on file    FAMILY HISTORY: Family History  Problem Relation Age of Onset   Liver cancer Maternal Aunt 55   Breast cancer Maternal Aunt 59   Breast cancer Maternal Grandmother 84   Lung cancer Paternal Grandfather 35       smoking hx   Breast cancer Other        MGM's sister & nieces   Breast cancer Cousin 54       triple negative    Review of Systems  Constitutional:  Positive for fatigue. Negative for appetite change, chills, fever and unexpected weight change.  HENT:   Negative for hearing loss, lump/mass and trouble swallowing.   Eyes:  Negative for eye problems and icterus.  Respiratory:  Negative for chest tightness, cough and shortness of breath.   Cardiovascular:  Negative for chest pain, leg swelling and palpitations.  Gastrointestinal:  Negative for abdominal distention, abdominal pain, constipation, diarrhea, nausea and vomiting.  Endocrine: Negative for hot flashes.  Genitourinary:  Negative for difficulty urinating.   Musculoskeletal:  Negative for arthralgias.  Skin:  Negative for itching and rash.  Neurological:  Negative for  dizziness, extremity weakness, headaches and numbness.  Hematological:  Negative for adenopathy. Does not bruise/bleed easily.  Psychiatric/Behavioral:  Negative for depression. The patient is not nervous/anxious.      PHYSICAL EXAMINATION  ECOG PERFORMANCE STATUS: 1 - Symptomatic but completely ambulatory  Vitals:   02/23/21 0919  BP: 137/67  Pulse: (!) 102  Resp: 18  Temp: (!) 97.5 F (36.4 C)  SpO2: 100%    Physical Exam Constitutional:      General: She is not in acute distress.    Appearance: Normal appearance. She is not toxic-appearing.  HENT:     Head: Normocephalic and atraumatic.  Eyes:     General: No scleral icterus. Cardiovascular:     Rate and Rhythm: Normal rate and regular rhythm.     Pulses: Normal pulses.     Heart sounds: Normal heart sounds.  Pulmonary:     Effort: Pulmonary effort is normal.     Breath sounds: Normal breath sounds.  Abdominal:     General: Abdomen is flat. Bowel sounds are normal. There is no distension.     Palpations: Abdomen is soft.  Tenderness: There is no abdominal tenderness.  Musculoskeletal:        General: No swelling.     Cervical back: Neck supple.  Lymphadenopathy:     Cervical: No cervical adenopathy.  Skin:    General: Skin is warm and dry.     Findings: No rash.  Neurological:     General: No focal deficit present.     Mental Status: She is alert.  Psychiatric:        Mood and Affect: Mood normal.        Behavior: Behavior normal.    LABORATORY DATA:  CBC    Component Value Date/Time   WBC 3.7 (L) 02/23/2021 0904   WBC 8.6 10/13/2020 1131   RBC 3.44 (L) 02/23/2021 0904   HGB 10.2 (L) 02/23/2021 0904   HCT 29.6 (L) 02/23/2021 0904   PLT 299 02/23/2021 0904   MCV 86.0 02/23/2021 0904   MCH 29.7 02/23/2021 0904   MCHC 34.5 02/23/2021 0904   RDW 15.0 02/23/2021 0904   LYMPHSABS 1.1 02/23/2021 0904   MONOABS 0.2 02/23/2021 0904   EOSABS 0.0 02/23/2021 0904   BASOSABS 0.0 02/23/2021 0904     CMP     Component Value Date/Time   NA 139 02/23/2021 0904   K 3.8 02/23/2021 0904   CL 105 02/23/2021 0904   CO2 26 02/23/2021 0904   GLUCOSE 134 (H) 02/23/2021 0904   BUN 9 02/23/2021 0904   CREATININE 0.46 02/23/2021 0904   CALCIUM 9.3 02/23/2021 0904   PROT 6.9 02/23/2021 0904   ALBUMIN 4.0 02/23/2021 0904   AST 23 02/23/2021 0904   ALT 42 02/23/2021 0904   ALKPHOS 65 02/23/2021 0904   BILITOT 0.3 02/23/2021 0904   GFRNONAA >60 02/23/2021 0904        ASSESSMENT and THERAPY PLAN:   Malignant neoplasm of upper-outer quadrant of left breast in female, estrogen receptor positive ([place]) 09/22/2020: Palpable mass in the left breast corresponding to suspicious mass on mammogram measuring 3 cm, possible 5 mm satellite nodule, left axilla negative, right breast negative ER 40% weak, PR negative, HER2 negative, Ki-67 60%   Breast MRI 09/28/2020: 3.7 cm enhancing mass at 3 o'clock position left breast with a possible 7 mm satellite nodule, no abnormal lymph nodes.   Treatment plan: 1.  Neoadjuvant chemotherapy with dose dense Adriamycin and Cytoxan followed by Taxol weekly x12 2. followed by mastectomy (patient's preference) 2. adjuvant radiation therapy (she will not need radiation if she has a mastectomy) 3.  Followed by adjuvant antiestrogen therapy with ovarian function suppression and aromatase inhibitor therapy Patient had her wedding on 10/22/2020 ---------------------------------------------------------------------------------------------------------------------------------------- Current treatment: Completed 4 cycles of dose dense Adriamycin and Cytoxan, today cycle 8 Abraxane Echocardiogram 10/13/2020: EF 60 to 65%   Chemo toxicities: Fatigue: Mild Chemotherapy-induced anemia: Monitoring closely today's hemoglobin is 10.2 3.  Allergic reaction to Taxol: Switched to Abraxane.  tolerating well no signs of peripheral neuropathy.   Allison Dodson is doing well today and will  proceed with treatment.  I reviewed all her labs with her in detail.  She is scheduled for MRI on February 28.  She is unsure where her visit will occur with Dr. Donne Hazel.  I gave her Dr. Cristal Generous office phone number and office address where her surgical planning visit will take place.  All questions were answered. The patient knows to call the clinic with any problems, questions or concerns. We can certainly see the patient much sooner if necessary.  Total encounter time:  20 minutes in face-to-face visit time, chart review, lab review, care coordination, order entry, and documentation of the encounter.  Wilber Bihari, NP 02/23/21 12:51 PM Medical Oncology and Hematology [facility] [place], [place] [address] Tel. [phone]    Fax. [phone]  *Total Encounter Time as defined by the Centers for Medicare and Medicaid Services includes, in addition to the face-to-face time of a patient visit (documented in the note above) non-face-to-face time: obtaining and reviewing outside history, ordering and reviewing medications, tests or procedures, care coordination (communications with other health care professionals or caregivers) and documentation in the medical record.

## 2021-03-02 ENCOUNTER — Other Ambulatory Visit: Payer: Self-pay

## 2021-03-02 ENCOUNTER — Inpatient Hospital Stay: Payer: No Typology Code available for payment source

## 2021-03-02 VITALS — BP 123/76 | HR 99 | Temp 98.5°F | Resp 18 | Wt 232.0 lb

## 2021-03-02 DIAGNOSIS — C50412 Malignant neoplasm of upper-outer quadrant of left female breast: Secondary | ICD-10-CM

## 2021-03-02 DIAGNOSIS — Z5111 Encounter for antineoplastic chemotherapy: Secondary | ICD-10-CM | POA: Diagnosis not present

## 2021-03-02 LAB — CBC WITH DIFFERENTIAL (CANCER CENTER ONLY)
Abs Immature Granulocytes: 0.07 10*3/uL (ref 0.00–0.07)
Basophils Absolute: 0 10*3/uL (ref 0.0–0.1)
Basophils Relative: 1 %
Eosinophils Absolute: 0 10*3/uL (ref 0.0–0.5)
Eosinophils Relative: 1 %
HCT: 31.2 % — ABNORMAL LOW (ref 36.0–46.0)
Hemoglobin: 10.5 g/dL — ABNORMAL LOW (ref 12.0–15.0)
Immature Granulocytes: 2 %
Lymphocytes Relative: 26 %
Lymphs Abs: 1.1 10*3/uL (ref 0.7–4.0)
MCH: 29.2 pg (ref 26.0–34.0)
MCHC: 33.7 g/dL (ref 30.0–36.0)
MCV: 86.9 fL (ref 80.0–100.0)
Monocytes Absolute: 0.3 10*3/uL (ref 0.1–1.0)
Monocytes Relative: 7 %
Neutro Abs: 2.8 10*3/uL (ref 1.7–7.7)
Neutrophils Relative %: 63 %
Platelet Count: 325 10*3/uL (ref 150–400)
RBC: 3.59 MIL/uL — ABNORMAL LOW (ref 3.87–5.11)
RDW: 15.2 % (ref 11.5–15.5)
WBC Count: 4.2 10*3/uL (ref 4.0–10.5)
nRBC: 0 % (ref 0.0–0.2)

## 2021-03-02 LAB — CMP (CANCER CENTER ONLY)
ALT: 46 U/L — ABNORMAL HIGH (ref 0–44)
AST: 32 U/L (ref 15–41)
Albumin: 4 g/dL (ref 3.5–5.0)
Alkaline Phosphatase: 58 U/L (ref 38–126)
Anion gap: 16 — ABNORMAL HIGH (ref 5–15)
BUN: 14 mg/dL (ref 6–20)
CO2: 23 mmol/L (ref 22–32)
Calcium: 9.5 mg/dL (ref 8.9–10.3)
Chloride: 101 mmol/L (ref 98–111)
Creatinine: 0.49 mg/dL (ref 0.44–1.00)
GFR, Estimated: >60 mL/min (ref >60)
Glucose, Bld: 107 mg/dL — ABNORMAL HIGH (ref 70–99)
Potassium: 4 mmol/L (ref 3.5–5.1)
Sodium: 140 mmol/L (ref 135–145)
Total Bilirubin: 0.1 mg/dL — ABNORMAL LOW (ref 0.3–1.2)
Total Protein: 7.1 g/dL (ref 6.5–8.1)

## 2021-03-02 MED ORDER — SODIUM CHLORIDE 0.9 % IV SOLN: Freq: Once | INTRAVENOUS | Status: AC

## 2021-03-02 MED ORDER — ONDANSETRON HCL 4 MG/2ML IJ SOLN
8.0000 mg | Freq: Once | INTRAMUSCULAR | Status: AC
  Administered 2021-03-02: 8 mg via INTRAVENOUS
  Filled 2021-03-02: qty 4

## 2021-03-02 MED ORDER — PACLITAXEL PROTEIN-BOUND CHEMO INJECTION 100 MG
80.0000 mg/m2 | Freq: Once | INTRAVENOUS | Status: AC
  Administered 2021-03-02: 175 mg via INTRAVENOUS
  Filled 2021-03-02: qty 35

## 2021-03-02 NOTE — Patient Instructions (Signed)
[facility]  Discharge Instructions: Thank you for choosing [facility] to provide your oncology and hematology care.   If you have a lab appointment with the Cancer Center, please go directly to the Cancer Center and check in at the registration area.   Wear comfortable clothing and clothing appropriate for easy access to any Portacath or PICC line.   We strive to give you quality time with your provider. You may need to reschedule your appointment if you arrive late (15 or more minutes).  Arriving late affects you and other patients whose appointments are after yours.  Also, if you miss three or more appointments without notifying the office, you may be dismissed from the clinic at the provider's discretion.      For prescription refill requests, have your pharmacy contact our office and allow 72 hours for refills to be completed.    Today you received the following chemotherapy and/or immunotherapy agents abraxane      To help prevent nausea and vomiting after your treatment, we encourage you to take your nausea medication as directed.  BELOW ARE SYMPTOMS THAT SHOULD BE REPORTED IMMEDIATELY: *FEVER GREATER THAN 100.4 F (38 C) OR HIGHER *CHILLS OR SWEATING *NAUSEA AND VOMITING THAT IS NOT CONTROLLED WITH YOUR NAUSEA MEDICATION *UNUSUAL SHORTNESS OF BREATH *UNUSUAL BRUISING OR BLEEDING *URINARY PROBLEMS (pain or burning when urinating, or frequent urination) *BOWEL PROBLEMS (unusual diarrhea, constipation, pain near the anus) TENDERNESS IN MOUTH AND THROAT WITH OR WITHOUT PRESENCE OF ULCERS (sore throat, sores in mouth, or a toothache) UNUSUAL RASH, SWELLING OR PAIN  UNUSUAL VAGINAL DISCHARGE OR ITCHING   Items with * indicate a potential emergency and should be followed up as soon as possible or go to the Emergency Department if any problems should occur.  Please show the CHEMOTHERAPY ALERT CARD or IMMUNOTHERAPY ALERT CARD at check-in to  the Emergency Department and triage nurse.  Should you have questions after your visit or need to cancel or reschedule your appointment, please contact [facility]  Dept: [phone]  and follow the prompts.  Office hours are 8:00 a.m. to 4:30 p.m. Monday - Friday. Please note that voicemails left after 4:00 p.m. may not be returned until the following business day.  We are closed weekends and major holidays. You have access to a nurse at all times for urgent questions. Please call the main number to the clinic Dept: [phone] and follow the prompts.   For any non-urgent questions, you may also contact your provider using MyChart. We now offer e-Visits for anyone 18 and older to request care online for non-urgent symptoms. For details visit [email or web address].   Also download the MyChart app! Go to the app store, search "MyChart", open the app, select [facility], and log in with your MyChart username and password.  Due to Covid, a mask is required upon entering the hospital/clinic. If you do not have a mask, one will be given to you upon arrival. For doctor visits, patients may have 1 support person aged 18 or older with them. For treatment visits, patients cannot have anyone with them due to current Covid guidelines and our immunocompromised population.   

## 2021-03-02 NOTE — Progress Notes (Signed)
Ok to treat without CMP results today per Dr. Lindi Adie.

## 2021-03-08 NOTE — Progress Notes (Signed)
° °Patient Care Team: °Chrzanowski, Jami B, NP as PCP - General (Nurse Practitioner) °Martini, Keisha N, RN as Oncology Nurse Navigator °Stuart, Dawn C, RN as Oncology Nurse Navigator °Gudena, Vinay, MD as Consulting Physician (Hematology and Oncology) °Wakefield, Matthew, MD as Consulting Physician (General Surgery) ° °DIAGNOSIS:  °  ICD-10-CM   °1. Malignant neoplasm of upper-outer quadrant of left breast in female, estrogen receptor positive (HCC)  C50.412   ° Z17.0   °  ° ° °SUMMARY OF ONCOLOGIC HISTORY: °Oncology History  °Malignant neoplasm of upper-outer quadrant of left breast in female, estrogen receptor positive (HCC)  °09/25/2020 Initial Diagnosis  ° Palpable lump in the left breast.  Mammogram revealed 3 cm tumor and a possible 5 mm satellite nodule.  Biopsy revealed grade 3 IDC ER 40% weak, PR 0%, HER2 negative, Ki-67 60% °  °09/25/2020 Cancer Staging  ° Staging form: Breast, AJCC 8th Edition °- Clinical stage from 09/25/2020: Stage IIB (cT2, cN0, cM0, G3, ER+, PR-, HER2-) - Signed by Gudena, Vinay, MD on 09/25/2020 °Stage prefix: Initial diagnosis °Histologic grading system: 3 grade system ° °  °10/02/2020 Genetic Testing  ° Negative hereditary cancer genetic testing: no pathogenic variants detected in [facility] BRCAPlus Panel and [facility] CancerNext-Expanded +RNAinsight Panel.  The report dates are October 02, 2020 and October 13, 2020, respectively.  ° °The BRCAplus panel offered by [facility] and includes sequencing and deletion/duplication analysis for the following 8 genes: ATM, BRCA1, BRCA2, CDH1, CHEK2, PALB2, PTEN, and TP53.  The CancerNext-Expanded gene panel offered by [facility] and includes sequencing, rearrangement, and RNA analysis for the following 77 genes: AIP, ALK, APC, ATM, AXIN2, BAP1, BARD1, BLM, BMPR1A, BRCA1, BRCA2, BRIP1, CDC73, CDH1, CDK4, CDKN1B, CDKN2A, CHEK2, CTNNA1, DICER1, FANCC, FH, FLCN, GALNT12, KIF1B, LZTR1, MAX, MEN1, MET, MLH1, MSH2, MSH3, MSH6, MUTYH, NBN, NF1, NF2,  NTHL1, PALB2, PHOX2B, PMS2, POT1, PRKAR1A, PTCH1, PTEN, RAD51C, RAD51D, RB1, RECQL, RET, SDHA, SDHAF2, SDHB, SDHC, SDHD, SMAD4, SMARCA4, SMARCB1, SMARCE1, STK11, SUFU, TMEM127, TP53, TSC1, TSC2, VHL and XRCC2 (sequencing and deletion/duplication); EGFR, EGLN1, HOXB13, KIT, MITF, PDGFRA, POLD1, and POLE (sequencing only); EPCAM and GREM1 (deletion/duplication only).  °  °11/03/2020 -  Chemotherapy  ° Patient is on Treatment Plan : BREAST ADJUVANT DOSE DENSE AC q14d / PACLitaxel switched to Abraxane (due to rxn) q7d  °  °  ° ° °CHIEF COMPLIANT: Cycle 9 Taxol ° °INTERVAL HISTORY: Allison Dodson is a 26 y.o. with above-mentioned history of breast cancer, currently undergoing dose dense Adriamycin and Cytoxan followed by Taxol. She presents to the clinic today for a toxicity check and treatment.  Overall she is tolerating chemo extremely well.  She denies any fevers or chills.  Denies any nausea or vomiting.  Denies any peripheral neuropathy.  She has noticed white spots on her teeth but denies any mouth sores.  Her gums appear to be very sensitive. ° °ALLERGIES:  is allergic to paclitaxel. ° °MEDICATIONS:  °Current Outpatient Medications  °Medication Sig Dispense Refill  ° ALPRAZolam (XANAX) 0.25 MG tablet Take by mouth.    ° lidocaine-prilocaine (EMLA) cream Apply to affected area once 30 g 3  ° °No current facility-administered medications for this visit.  ° ° °PHYSICAL EXAMINATION: °ECOG PERFORMANCE STATUS: 1 - Symptomatic but completely ambulatory ° °Vitals:  ° 03/09/21 0934  °BP: 136/72  °Pulse: (!) 109  °Resp: 17  °Temp: (!) 97.5 °F (36.4 °C)  °SpO2: 100%  ° °Filed Weights  ° 03/09/21 0934  °Weight: 232 lb (105.2 kg)  ° ° °  LABORATORY DATA:  I have reviewed the data as listed CMP Latest Ref Rng & Units 03/02/2021 02/23/2021 02/16/2021  Glucose 70 - 99 mg/dL 107(H) 134(H) 114(H)  BUN 6 - 20 mg/dL _0 Creatinine 0.44 - 1.00 mg/dL 0.49 0.46 0.51  Sodium 135 - 145 mmol/L 140 139 140  Potassium 3.5 - 5.1 mmol/L 4.0  3.8 4.0  Chloride 98 - 111 mmol/L 101 105 106  CO2 22 - 32 mmol/L _1 Calcium 8.9 - 10.3 mg/dL 9.5 9.3 9.3  Total Protein 6.5 - 8.1 g/dL 7.1 6.9 7.2  Total Bilirubin 0.3 - 1.2 mg/dL 0.1(L) 0.3 0.3  Alkaline Phos 38 - 126 U/L 58 65 63  AST 15 - 41 U/L 32 23 24  ALT 0 - 44 U/L 46(H) 42 43    Lab Results  Component Value Date   WBC 3.5 (L) 03/09/2021   HGB 10.3 (L) 03/09/2021   HCT 31.1 (L) 03/09/2021   MCV 87.9 03/09/2021   PLT 311 03/09/2021   NEUTROABS 2.4 03/09/2021    ASSESSMENT & PLAN:  Malignant neoplasm of upper-outer quadrant of left breast in female, estrogen receptor positive ([place]) 09/22/2020: Palpable mass in the left breast corresponding to suspicious mass on mammogram measuring 3 cm, possible 5 mm satellite nodule, left axilla negative, right breast negative ER 40% weak, PR negative, HER2 negative, Ki-67 60%   Breast MRI 09/28/2020: 3.7 cm enhancing mass at 3 o'clock position left breast with a possible 7 mm satellite nodule, no abnormal lymph nodes.   Treatment plan: 1.  Neoadjuvant chemotherapy with dose dense Adriamycin and Cytoxan followed by Taxol weekly x12 2. followed by mastectomy (patient's preference) 2. adjuvant radiation therapy (she will not need radiation if she has a mastectomy) 3.  Followed by adjuvant antiestrogen therapy with ovarian function suppression and aromatase inhibitor therapy Patient had her wedding on 10/22/2020 ---------------------------------------------------------------------------------------------------------------------------------------- Current treatment: Completed 4 cycles of dose dense Adriamycin and Cytoxan, today cycle 9 Abraxane Echocardiogram 10/13/2020: EF 60 to 65%   Chemo toxicities: Denies any nausea or vomiting Fatigue: Mild Chemotherapy-induced anemia: Monitoring closely today's hemoglobin is 10.3 4. Allregic reaction to Taxol: Switched to Abraxane.  Today is a second treatment of Abraxane.  She tolerated first  treatment of Abraxane extremely well.  She did not have any nausea vomiting fatigue or any bowel disturbances. 5.  Leukopenia: ANC 2.4: We will watch this closely.  If her ANC drops below 2 then we will reduce the dosage of Abraxane. Denies any peripheral neuropathy.   Monitoring closely for toxicities.  She has an appointment for breast MRI and an appointment to see Dr. Donne Hazel. Because she was weakly ER positive, her port can be removed with surgery.  If she has significant residual disease we may consider adjuvant capecitabine. Because of the slight delay in her Taxol treatments, her treatment is now going to be finished on 03/31/2021.      No orders of the defined types were placed in this encounter.  The patient has a good understanding of the overall plan. she agrees with it. she will call with any problems that may develop before the next visit here.  Total time spent: 30 mins including face to face time and time spent for planning, charting and coordination of care  Rulon Eisenmenger, MD, MPH 03/09/2021  I, Thana Ates, am acting as scribe for Dr. Nicholas Lose.  I have reviewed the above documentation for accuracy and completeness, and I agree with the  above.

## 2021-03-09 ENCOUNTER — Inpatient Hospital Stay: Payer: No Typology Code available for payment source

## 2021-03-09 ENCOUNTER — Inpatient Hospital Stay (HOSPITAL_BASED_OUTPATIENT_CLINIC_OR_DEPARTMENT_OTHER): Payer: No Typology Code available for payment source | Admitting: Hematology and Oncology

## 2021-03-09 ENCOUNTER — Other Ambulatory Visit: Payer: Self-pay

## 2021-03-09 VITALS — BP 134/72 | HR 92 | Temp 98.2°F | Resp 18

## 2021-03-09 DIAGNOSIS — Z170 Estrogen receptor positive status [ER+]: Secondary | ICD-10-CM | POA: Diagnosis not present

## 2021-03-09 DIAGNOSIS — Z95828 Presence of other vascular implants and grafts: Secondary | ICD-10-CM

## 2021-03-09 DIAGNOSIS — Z5111 Encounter for antineoplastic chemotherapy: Secondary | ICD-10-CM | POA: Diagnosis not present

## 2021-03-09 DIAGNOSIS — C50412 Malignant neoplasm of upper-outer quadrant of left female breast: Secondary | ICD-10-CM | POA: Diagnosis not present

## 2021-03-09 LAB — CBC WITH DIFFERENTIAL (CANCER CENTER ONLY)
Abs Immature Granulocytes: 0.03 10*3/uL (ref 0.00–0.07)
Basophils Absolute: 0 10*3/uL (ref 0.0–0.1)
Basophils Relative: 0 %
Eosinophils Absolute: 0 10*3/uL (ref 0.0–0.5)
Eosinophils Relative: 1 %
HCT: 31.1 % — ABNORMAL LOW (ref 36.0–46.0)
Hemoglobin: 10.3 g/dL — ABNORMAL LOW (ref 12.0–15.0)
Immature Granulocytes: 1 %
Lymphocytes Relative: 23 %
Lymphs Abs: 0.8 10*3/uL (ref 0.7–4.0)
MCH: 29.1 pg (ref 26.0–34.0)
MCHC: 33.1 g/dL (ref 30.0–36.0)
MCV: 87.9 fL (ref 80.0–100.0)
Monocytes Absolute: 0.2 10*3/uL (ref 0.1–1.0)
Monocytes Relative: 7 %
Neutro Abs: 2.4 10*3/uL (ref 1.7–7.7)
Neutrophils Relative %: 68 %
Platelet Count: 311 10*3/uL (ref 150–400)
RBC: 3.54 MIL/uL — ABNORMAL LOW (ref 3.87–5.11)
RDW: 15.4 % (ref 11.5–15.5)
WBC Count: 3.5 10*3/uL — ABNORMAL LOW (ref 4.0–10.5)
nRBC: 0 % (ref 0.0–0.2)

## 2021-03-09 LAB — CMP (CANCER CENTER ONLY)
ALT: 39 U/L (ref 0–44)
AST: 22 U/L (ref 15–41)
Albumin: 4.1 g/dL (ref 3.5–5.0)
Alkaline Phosphatase: 61 U/L (ref 38–126)
Anion gap: 5 (ref 5–15)
BUN: 10 mg/dL (ref 6–20)
CO2: 27 mmol/L (ref 22–32)
Calcium: 9.1 mg/dL (ref 8.9–10.3)
Chloride: 107 mmol/L (ref 98–111)
Creatinine: 0.5 mg/dL (ref 0.44–1.00)
GFR, Estimated: >60 mL/min (ref >60)
Glucose, Bld: 110 mg/dL — ABNORMAL HIGH (ref 70–99)
Potassium: 3.9 mmol/L (ref 3.5–5.1)
Sodium: 139 mmol/L (ref 135–145)
Total Bilirubin: 0.3 mg/dL (ref 0.3–1.2)
Total Protein: 6.9 g/dL (ref 6.5–8.1)

## 2021-03-09 MED ORDER — PACLITAXEL PROTEIN-BOUND CHEMO INJECTION 100 MG
80.0000 mg/m2 | Freq: Once | INTRAVENOUS | Status: AC
  Administered 2021-03-09: 175 mg via INTRAVENOUS
  Filled 2021-03-09: qty 35

## 2021-03-09 MED ORDER — SODIUM CHLORIDE 0.9% FLUSH
10.0000 mL | Freq: Once | INTRAVENOUS | Status: AC
  Administered 2021-03-09: 10 mL

## 2021-03-09 MED ORDER — SODIUM CHLORIDE 0.9 % IV SOLN: Freq: Once | INTRAVENOUS | Status: AC

## 2021-03-09 MED ORDER — ONDANSETRON HCL 4 MG/2ML IJ SOLN
8.0000 mg | Freq: Once | INTRAMUSCULAR | Status: AC
  Administered 2021-03-09: 8 mg via INTRAVENOUS
  Filled 2021-03-09: qty 4

## 2021-03-09 NOTE — Patient Instructions (Signed)
[facility]  Discharge Instructions: Thank you for choosing [facility] to provide your oncology and hematology care.   If you have a lab appointment with the Cancer Center, please go directly to the Cancer Center and check in at the registration area.   Wear comfortable clothing and clothing appropriate for easy access to any Portacath or PICC line.   We strive to give you quality time with your provider. You may need to reschedule your appointment if you arrive late (15 or more minutes).  Arriving late affects you and other patients whose appointments are after yours.  Also, if you miss three or more appointments without notifying the office, you may be dismissed from the clinic at the provider's discretion.      For prescription refill requests, have your pharmacy contact our office and allow 72 hours for refills to be completed.    Today you received the following chemotherapy and/or immunotherapy agents abraxane      To help prevent nausea and vomiting after your treatment, we encourage you to take your nausea medication as directed.  BELOW ARE SYMPTOMS THAT SHOULD BE REPORTED IMMEDIATELY: *FEVER GREATER THAN 100.4 F (38 C) OR HIGHER *CHILLS OR SWEATING *NAUSEA AND VOMITING THAT IS NOT CONTROLLED WITH YOUR NAUSEA MEDICATION *UNUSUAL SHORTNESS OF BREATH *UNUSUAL BRUISING OR BLEEDING *URINARY PROBLEMS (pain or burning when urinating, or frequent urination) *BOWEL PROBLEMS (unusual diarrhea, constipation, pain near the anus) TENDERNESS IN MOUTH AND THROAT WITH OR WITHOUT PRESENCE OF ULCERS (sore throat, sores in mouth, or a toothache) UNUSUAL RASH, SWELLING OR PAIN  UNUSUAL VAGINAL DISCHARGE OR ITCHING   Items with * indicate a potential emergency and should be followed up as soon as possible or go to the Emergency Department if any problems should occur.  Please show the CHEMOTHERAPY ALERT CARD or IMMUNOTHERAPY ALERT CARD at check-in to  the Emergency Department and triage nurse.  Should you have questions after your visit or need to cancel or reschedule your appointment, please contact [facility]  Dept: [phone]  and follow the prompts.  Office hours are 8:00 a.m. to 4:30 p.m. Monday - Friday. Please note that voicemails left after 4:00 p.m. may not be returned until the following business day.  We are closed weekends and major holidays. You have access to a nurse at all times for urgent questions. Please call the main number to the clinic Dept: [phone] and follow the prompts.   For any non-urgent questions, you may also contact your provider using MyChart. We now offer e-Visits for anyone 18 and older to request care online for non-urgent symptoms. For details visit [email or web address].   Also download the MyChart app! Go to the app store, search "MyChart", open the app, select [facility], and log in with your MyChart username and password.  Due to Covid, a mask is required upon entering the hospital/clinic. If you do not have a mask, one will be given to you upon arrival. For doctor visits, patients may have 1 support person aged 18 or older with them. For treatment visits, patients cannot have anyone with them due to current Covid guidelines and our immunocompromised population.   

## 2021-03-09 NOTE — Assessment & Plan Note (Signed)
09/22/2020: Palpable mass in the left breast corresponding to suspicious mass on mammogram measuring 3 cm, possible 5 mm satellite nodule, left axilla negative, right breast negative ER 40% weak, PR negative, HER2 negative, Ki-67 60%  Breast MRI 09/28/2020: 3.7 cm enhancing mass at 3 o'clock position left breast with a possible 7 mm satellite nodule, no abnormal lymph nodes.  Treatment plan: 1.Neoadjuvant chemotherapy with dose dense Adriamycin and Cytoxan followed by Taxol weekly x12 2.followed by mastectomy (patient's preference) 2.adjuvant radiation therapy(she will not need radiation if she has a mastectomy) 3.Followed by adjuvant antiestrogen therapy with ovarian function suppression and aromatase inhibitor therapy Patient had her wedding on 10/22/2020 ---------------------------------------------------------------------------------------------------------------------------------------- Current treatment:Completed 4 cycles ofdose dense Adriamycin and Cytoxan, today cycle10 Abraxane Echocardiogram 10/13/2020: EF 60 to 65%  Chemo toxicities: 1. Denies any nausea or vomiting 2. Fatigue: Mild 3. Chemotherapy-induced anemia: Monitoring closely today's hemoglobin is9.8 4. Allregic reaction to Taxol: Switched to Abraxane.Today is a second treatment of Abraxane. She tolerated first treatment of Abraxane extremely well. She did not have any nausea vomiting fatigue or any bowel disturbances. 5.  Leukopenia: ANC 2.2: We will watch this closely.  If her ANC drops below 2 then we will reduce the dosage of Abraxane.   Monitoring closely for toxicities. Because of the slight delay in her Taxol treatments, her treatment is now going to be finished on 03/31/2021.

## 2021-03-10 ENCOUNTER — Ambulatory Visit
Admission: RE | Admit: 2021-03-10 | Discharge: 2021-03-10 | Disposition: A | Discharge status: Home / Self Care | Payer: No Typology Code available for payment source | Source: Ambulatory Visit | Attending: Hematology and Oncology | Admitting: Hematology and Oncology

## 2021-03-10 DIAGNOSIS — Z170 Estrogen receptor positive status [ER+]: Secondary | ICD-10-CM

## 2021-03-10 DIAGNOSIS — C50412 Malignant neoplasm of upper-outer quadrant of left female breast: Secondary | ICD-10-CM

## 2021-03-10 MED ORDER — GADOBUTROL 1 MMOL/ML IV SOLN: 10.0000 mL | Freq: Once | INTRAVENOUS | Status: DC | PRN

## 2021-03-10 MED ORDER — GADOBUTROL 1 MMOL/ML IV SOLN
10.0000 mL | Freq: Once | INTRAVENOUS | Status: AC | PRN
  Administered 2021-03-10: 10 mL via INTRAVENOUS

## 2021-03-16 ENCOUNTER — Other Ambulatory Visit: Payer: Self-pay

## 2021-03-16 ENCOUNTER — Inpatient Hospital Stay: Payer: No Typology Code available for payment source

## 2021-03-16 VITALS — BP 136/75 | HR 99 | Temp 98.3°F | Resp 16

## 2021-03-16 DIAGNOSIS — C50412 Malignant neoplasm of upper-outer quadrant of left female breast: Secondary | ICD-10-CM

## 2021-03-16 DIAGNOSIS — Z170 Estrogen receptor positive status [ER+]: Secondary | ICD-10-CM

## 2021-03-16 DIAGNOSIS — Z5111 Encounter for antineoplastic chemotherapy: Secondary | ICD-10-CM | POA: Diagnosis not present

## 2021-03-16 DIAGNOSIS — Z95828 Presence of other vascular implants and grafts: Secondary | ICD-10-CM

## 2021-03-16 LAB — CBC WITH DIFFERENTIAL (CANCER CENTER ONLY)
Abs Immature Granulocytes: 0.02 10*3/uL (ref 0.00–0.07)
Basophils Absolute: 0 10*3/uL (ref 0.0–0.1)
Basophils Relative: 0 %
Eosinophils Absolute: 0 10*3/uL (ref 0.0–0.5)
Eosinophils Relative: 1 %
HCT: 30.7 % — ABNORMAL LOW (ref 36.0–46.0)
Hemoglobin: 10.4 g/dL — ABNORMAL LOW (ref 12.0–15.0)
Immature Granulocytes: 1 %
Lymphocytes Relative: 27 %
Lymphs Abs: 0.8 10*3/uL (ref 0.7–4.0)
MCH: 29.1 pg (ref 26.0–34.0)
MCHC: 33.9 g/dL (ref 30.0–36.0)
MCV: 86 fL (ref 80.0–100.0)
Monocytes Absolute: 0.2 10*3/uL (ref 0.1–1.0)
Monocytes Relative: 7 %
Neutro Abs: 2 10*3/uL (ref 1.7–7.7)
Neutrophils Relative %: 64 %
Platelet Count: 323 10*3/uL (ref 150–400)
RBC: 3.57 MIL/uL — ABNORMAL LOW (ref 3.87–5.11)
RDW: 15.3 % (ref 11.5–15.5)
WBC Count: 3.1 10*3/uL — ABNORMAL LOW (ref 4.0–10.5)
nRBC: 0 % (ref 0.0–0.2)

## 2021-03-16 LAB — CMP (CANCER CENTER ONLY)
ALT: 41 U/L (ref 0–44)
AST: 24 U/L (ref 15–41)
Albumin: 4.2 g/dL (ref 3.5–5.0)
Alkaline Phosphatase: 57 U/L (ref 38–126)
Anion gap: 7 (ref 5–15)
BUN: 9 mg/dL (ref 6–20)
CO2: 27 mmol/L (ref 22–32)
Calcium: 9.4 mg/dL (ref 8.9–10.3)
Chloride: 106 mmol/L (ref 98–111)
Creatinine: 0.51 mg/dL (ref 0.44–1.00)
GFR, Estimated: >60 mL/min (ref >60)
Glucose, Bld: 134 mg/dL — ABNORMAL HIGH (ref 70–99)
Potassium: 3.7 mmol/L (ref 3.5–5.1)
Sodium: 140 mmol/L (ref 135–145)
Total Bilirubin: 0.3 mg/dL (ref 0.3–1.2)
Total Protein: 7.1 g/dL (ref 6.5–8.1)

## 2021-03-16 MED ORDER — PACLITAXEL PROTEIN-BOUND CHEMO INJECTION 100 MG
80.0000 mg/m2 | Freq: Once | INTRAVENOUS | Status: AC
  Administered 2021-03-16: 175 mg via INTRAVENOUS
  Filled 2021-03-16: qty 35

## 2021-03-16 MED ORDER — SODIUM CHLORIDE 0.9% FLUSH
10.0000 mL | INTRAVENOUS | Status: DC | PRN
  Administered 2021-03-16: 10 mL

## 2021-03-16 MED ORDER — ONDANSETRON HCL 4 MG/2ML IJ SOLN
8.0000 mg | Freq: Once | INTRAMUSCULAR | Status: AC
  Administered 2021-03-16: 8 mg via INTRAVENOUS
  Filled 2021-03-16: qty 4

## 2021-03-16 MED ORDER — SODIUM CHLORIDE 0.9% FLUSH
10.0000 mL | Freq: Once | INTRAVENOUS | Status: AC
  Administered 2021-03-16: 10 mL

## 2021-03-16 MED ORDER — SODIUM CHLORIDE 0.9 % IV SOLN: Freq: Once | INTRAVENOUS | Status: AC

## 2021-03-16 MED ORDER — HEPARIN SOD (PORK) LOCK FLUSH 100 UNIT/ML IV SOLN
500.0000 [IU] | Freq: Once | INTRAVENOUS | Status: AC | PRN
  Administered 2021-03-16: 500 [IU]

## 2021-03-16 NOTE — Patient Instructions (Signed)
[facility]   Discharge Instructions: Thank you for choosing [facility] to provide your oncology and hematology care.   If you have a lab appointment with the [facility], please go directly to the [facility] and check in at the registration area.   Wear comfortable clothing and clothing appropriate for easy access to any Portacath or PICC line.   We strive to give you quality time with your provider. You may need to reschedule your appointment if you arrive late (15 or more minutes).  Arriving late affects you and other patients whose appointments are after yours.  Also, if you miss three or more appointments without notifying the office, you may be dismissed from the clinic at the providers discretion.      For prescription refill requests, have your pharmacy contact our office and allow 72 hours for refills to be completed.    Today you received the following chemotherapy and/or immunotherapy agents: paclitaxel (protein-bound)      To help prevent nausea and vomiting after your treatment, we encourage you to take your nausea medication as directed.  BELOW ARE SYMPTOMS THAT SHOULD BE REPORTED IMMEDIATELY: *FEVER GREATER THAN 100.4 F (38 C) OR HIGHER *CHILLS OR SWEATING *NAUSEA AND VOMITING THAT IS NOT CONTROLLED WITH YOUR NAUSEA MEDICATION *UNUSUAL SHORTNESS OF BREATH *UNUSUAL BRUISING OR BLEEDING *URINARY PROBLEMS (pain or burning when urinating, or frequent urination) *BOWEL PROBLEMS (unusual diarrhea, constipation, pain near the anus) TENDERNESS IN MOUTH AND THROAT WITH OR WITHOUT PRESENCE OF ULCERS (sore throat, sores in mouth, or a toothache) UNUSUAL RASH, SWELLING OR PAIN  UNUSUAL VAGINAL DISCHARGE OR ITCHING   Items with * indicate a potential emergency and should be followed up as soon as possible or go to the Emergency Department if any problems should occur.  Please show the CHEMOTHERAPY ALERT CARD or IMMUNOTHERAPY ALERT  CARD at check-in to the Emergency Department and triage nurse.  Should you have questions after your visit or need to cancel or reschedule your appointment, please contact [facility]  Dept: [phone]  and follow the prompts.  Office hours are 8:00 a.m. to 4:30 p.m. Monday - Friday. Please note that voicemails left after 4:00 p.m. may not be returned until the following business day.  We are closed weekends and major holidays. You have access to a nurse at all times for urgent questions. Please call the main number to the clinic Dept: [phone] and follow the prompts.   For any non-urgent questions, you may also contact your provider using MyChart. We now offer e-Visits for anyone 67 and older to request care online for non-urgent symptoms. For details visit [email or web address].   Also download the MyChart app! Go to the app store, search "MyChart", open the app, select [facility], and log in with your MyChart username and password.  Due to Covid, a mask is required upon entering the hospital/clinic. If you do not have a mask, one will be given to you upon arrival. For doctor visits, patients may have 1 support person aged 59 or older with them. For treatment visits, patients cannot have anyone with them due to current Covid guidelines and our immunocompromised population.

## 2021-03-23 ENCOUNTER — Inpatient Hospital Stay (HOSPITAL_BASED_OUTPATIENT_CLINIC_OR_DEPARTMENT_OTHER): Payer: No Typology Code available for payment source | Admitting: Adult Health

## 2021-03-23 ENCOUNTER — Encounter: Payer: Self-pay | Admitting: *Deleted

## 2021-03-23 ENCOUNTER — Other Ambulatory Visit: Payer: Self-pay

## 2021-03-23 ENCOUNTER — Telehealth: Payer: Self-pay | Admitting: *Deleted

## 2021-03-23 ENCOUNTER — Encounter: Payer: Self-pay | Admitting: Adult Health

## 2021-03-23 ENCOUNTER — Inpatient Hospital Stay: Payer: No Typology Code available for payment source | Attending: Hematology and Oncology

## 2021-03-23 ENCOUNTER — Inpatient Hospital Stay: Payer: No Typology Code available for payment source

## 2021-03-23 VITALS — BP 129/75 | HR 88 | Temp 97.9°F | Resp 16 | Ht 63.0 in | Wt 230.5 lb

## 2021-03-23 DIAGNOSIS — T451X5A Adverse effect of antineoplastic and immunosuppressive drugs, initial encounter: Secondary | ICD-10-CM | POA: Diagnosis not present

## 2021-03-23 DIAGNOSIS — D6481 Anemia due to antineoplastic chemotherapy: Secondary | ICD-10-CM | POA: Insufficient documentation

## 2021-03-23 DIAGNOSIS — R5383 Other fatigue: Secondary | ICD-10-CM | POA: Diagnosis not present

## 2021-03-23 DIAGNOSIS — Z803 Family history of malignant neoplasm of breast: Secondary | ICD-10-CM | POA: Diagnosis not present

## 2021-03-23 DIAGNOSIS — Z801 Family history of malignant neoplasm of trachea, bronchus and lung: Secondary | ICD-10-CM | POA: Diagnosis not present

## 2021-03-23 DIAGNOSIS — Z79899 Other long term (current) drug therapy: Secondary | ICD-10-CM | POA: Diagnosis not present

## 2021-03-23 DIAGNOSIS — Z800 Family history of malignant neoplasm of digestive organs: Secondary | ICD-10-CM | POA: Diagnosis not present

## 2021-03-23 DIAGNOSIS — C50412 Malignant neoplasm of upper-outer quadrant of left female breast: Secondary | ICD-10-CM | POA: Insufficient documentation

## 2021-03-23 DIAGNOSIS — Z95828 Presence of other vascular implants and grafts: Secondary | ICD-10-CM

## 2021-03-23 DIAGNOSIS — Z170 Estrogen receptor positive status [ER+]: Secondary | ICD-10-CM

## 2021-03-23 DIAGNOSIS — Z5111 Encounter for antineoplastic chemotherapy: Secondary | ICD-10-CM | POA: Diagnosis not present

## 2021-03-23 LAB — CBC WITH DIFFERENTIAL (CANCER CENTER ONLY)
Abs Immature Granulocytes: 0.02 10*3/uL (ref 0.00–0.07)
Basophils Absolute: 0 10*3/uL (ref 0.0–0.1)
Basophils Relative: 0 %
Eosinophils Absolute: 0 10*3/uL (ref 0.0–0.5)
Eosinophils Relative: 1 %
HCT: 32.9 % — ABNORMAL LOW (ref 36.0–46.0)
Hemoglobin: 10.8 g/dL — ABNORMAL LOW (ref 12.0–15.0)
Immature Granulocytes: 1 %
Lymphocytes Relative: 25 %
Lymphs Abs: 0.9 10*3/uL (ref 0.7–4.0)
MCH: 28.6 pg (ref 26.0–34.0)
MCHC: 32.8 g/dL (ref 30.0–36.0)
MCV: 87.3 fL (ref 80.0–100.0)
Monocytes Absolute: 0.2 10*3/uL (ref 0.1–1.0)
Monocytes Relative: 6 %
Neutro Abs: 2.2 10*3/uL (ref 1.7–7.7)
Neutrophils Relative %: 67 %
Platelet Count: 318 10*3/uL (ref 150–400)
RBC: 3.77 MIL/uL — ABNORMAL LOW (ref 3.87–5.11)
RDW: 15.4 % (ref 11.5–15.5)
WBC Count: 3.4 10*3/uL — ABNORMAL LOW (ref 4.0–10.5)
nRBC: 0 % (ref 0.0–0.2)

## 2021-03-23 LAB — CMP (CANCER CENTER ONLY)
ALT: 36 U/L (ref 0–44)
AST: 21 U/L (ref 15–41)
Albumin: 4.2 g/dL (ref 3.5–5.0)
Alkaline Phosphatase: 57 U/L (ref 38–126)
Anion gap: 6 (ref 5–15)
BUN: 10 mg/dL (ref 6–20)
CO2: 26 mmol/L (ref 22–32)
Calcium: 9.6 mg/dL (ref 8.9–10.3)
Chloride: 106 mmol/L (ref 98–111)
Creatinine: 0.44 mg/dL (ref 0.44–1.00)
GFR, Estimated: >60 mL/min (ref >60)
Glucose, Bld: 99 mg/dL (ref 70–99)
Potassium: 3.8 mmol/L (ref 3.5–5.1)
Sodium: 138 mmol/L (ref 135–145)
Total Bilirubin: 0.3 mg/dL (ref 0.3–1.2)
Total Protein: 7.3 g/dL (ref 6.5–8.1)

## 2021-03-23 MED ORDER — PACLITAXEL PROTEIN-BOUND CHEMO INJECTION 100 MG
80.0000 mg/m2 | Freq: Once | INTRAVENOUS | Status: AC
  Administered 2021-03-23: 175 mg via INTRAVENOUS
  Filled 2021-03-23: qty 35

## 2021-03-23 MED ORDER — ONDANSETRON HCL 4 MG/2ML IJ SOLN
8.0000 mg | Freq: Once | INTRAMUSCULAR | Status: AC
  Administered 2021-03-23: 8 mg via INTRAVENOUS
  Filled 2021-03-23: qty 4

## 2021-03-23 MED ORDER — SODIUM CHLORIDE 0.9% FLUSH
10.0000 mL | INTRAVENOUS | Status: DC | PRN
  Administered 2021-03-23: 10 mL

## 2021-03-23 MED ORDER — SODIUM CHLORIDE 0.9% FLUSH
10.0000 mL | Freq: Once | INTRAVENOUS | Status: AC
  Administered 2021-03-23: 10 mL

## 2021-03-23 MED ORDER — SODIUM CHLORIDE 0.9 % IV SOLN: Freq: Once | INTRAVENOUS | Status: AC

## 2021-03-23 MED ORDER — HEPARIN SOD (PORK) LOCK FLUSH 100 UNIT/ML IV SOLN
500.0000 [IU] | Freq: Once | INTRAVENOUS | Status: AC | PRN
  Administered 2021-03-23: 500 [IU]

## 2021-03-23 NOTE — Telephone Encounter (Signed)
-----   Message from Kerry Dory, NP sent at 03/23/2021  2:37 PM EST ----- ?Regarding: [facility] appt ?Please contact patient and let her know I have switched offices. She is 6 months into breast cancer treatment and needs a pap/annual. ? ? ? ? ? ?----- Message ----- ?From: Gardenia Phlegm, NP ?Sent: 03/23/2021  12:38 PM EST ?To: Kerry Dory, NP ? ? ?

## 2021-03-23 NOTE — Assessment & Plan Note (Addendum)
09/22/2020: Palpable mass in the left breast corresponding to suspicious mass on mammogram measuring 3 cm, possible 5 mm satellite nodule, left axilla negative, right breast negative ?ER 40% weak, PR negative, HER2 negative, Ki-67 60% ?? ?Breast MRI 09/28/2020: 3.7 cm enhancing mass at 3 o'clock position left breast with a possible 7 mm satellite nodule, no abnormal lymph nodes. ?? ?Treatment plan: ?1.??Neoadjuvant chemotherapy with dose dense Adriamycin and Cytoxan followed by Taxol weekly x12 ?2.?followed by mastectomy (patient's preference) ?2.?adjuvant radiation therapy?(she will not need radiation if she has a mastectomy) ?3.??Followed by adjuvant antiestrogen therapy with ovarian function suppression and aromatase inhibitor therapy ?Patient had her wedding on 10/22/2020 ?---------------------------------------------------------------------------------------------------------------------------------------- ?Current treatment:?Completed 4 cycles of?dose dense Adriamycin and Cytoxan, today cycle?11 Abraxane ?Echocardiogram 10/13/2020: EF 60 to 65% ?? ?Chemo toxicities: ?1. Fatigue: Mild ?2. Chemotherapy-induced anemia: Monitoring closely today's hemoglobin is 10.8 ?3.  Allergic reaction to Taxol: Switched to Abraxane.?? ? ? ?She will proceed with treatment today.  She has no signs of peripheral neuropathy which we are monitoring her closely for. ?? ?

## 2021-03-23 NOTE — Patient Instructions (Signed)
[facility]  ? Discharge Instructions: ?Thank you for choosing [facility] to provide your oncology and hematology care.  ? ?If you have a lab appointment with the [facility], please go directly to the [facility] and check in at the registration area. ?  ?Wear comfortable clothing and clothing appropriate for easy access to any Portacath or PICC line.  ? ?We strive to give you quality time with your provider. You may need to reschedule your appointment if you arrive late (15 or more minutes).  Arriving late affects you and other patients whose appointments are after yours.  Also, if you miss three or more appointments without notifying the office, you may be dismissed from the clinic at the provider?s discretion.    ?  ?For prescription refill requests, have your pharmacy contact our office and allow 72 hours for refills to be completed.   ? ?Today you received the following chemotherapy and/or immunotherapy agents: paclitaxel (protein-bound)    ?  ?To help prevent nausea and vomiting after your treatment, we encourage you to take your nausea medication as directed. ? ?BELOW ARE SYMPTOMS THAT SHOULD BE REPORTED IMMEDIATELY: ?*FEVER GREATER THAN 100.4 F (38 ?C) OR HIGHER ?*CHILLS OR SWEATING ?*NAUSEA AND VOMITING THAT IS NOT CONTROLLED WITH YOUR NAUSEA MEDICATION ?*UNUSUAL SHORTNESS OF BREATH ?*UNUSUAL BRUISING OR BLEEDING ?*URINARY PROBLEMS (pain or burning when urinating, or frequent urination) ?*BOWEL PROBLEMS (unusual diarrhea, constipation, pain near the anus) ?TENDERNESS IN MOUTH AND THROAT WITH OR WITHOUT PRESENCE OF ULCERS (sore throat, sores in mouth, or a toothache) ?UNUSUAL RASH, SWELLING OR PAIN  ?UNUSUAL VAGINAL DISCHARGE OR ITCHING  ? ?Items with * indicate a potential emergency and should be followed up as soon as possible or go to the Emergency Department if any problems should occur. ? ?Please show the CHEMOTHERAPY ALERT CARD or IMMUNOTHERAPY ALERT  CARD at check-in to the Emergency Department and triage nurse. ? ?Should you have questions after your visit or need to cancel or reschedule your appointment, please contact [facility]  Dept: [phone]  and follow the prompts.  Office hours are 8:00 a.m. to 4:30 p.m. Monday - Friday. Please note that voicemails left after 4:00 p.m. may not be returned until the following business day.  We are closed weekends and major holidays. You have access to a nurse at all times for urgent questions. Please call the main number to the clinic Dept: [phone] and follow the prompts. ? ? ?For any non-urgent questions, you may also contact your provider using MyChart. We now offer e-Visits for anyone 68 and older to request care online for non-urgent symptoms. For details visit [email or web address]. ?  ?Also download the MyChart app! Go to the app store, search "MyChart", open the app, select [facility], and log in with your MyChart username and password. ? ?Due to Covid, a mask is required upon entering the hospital/clinic. If you do not have a mask, one will be given to you upon arrival. For doctor visits, patients may have 1 support person aged 52 or older with them. For treatment visits, patients cannot have anyone with them due to current Covid guidelines and our immunocompromised population.  ? ?

## 2021-03-23 NOTE — Telephone Encounter (Signed)
Left message for patient to call.

## 2021-03-23 NOTE — Telephone Encounter (Signed)
Spoke to pt concerning per pt, Dr. Lindi Adie recommended final chemo to be on 3/13 as she did not receive all Taxol dose on 12/27 d/t rxn.  ? ?

## 2021-03-23 NOTE — Progress Notes (Signed)
[facility] Follow up:    Allison Dory, NP [address] [place] [address]   DIAGNOSIS:  Cancer Staging  Malignant neoplasm of upper-outer quadrant of left breast in female, estrogen receptor positive ([place]) Staging form: Breast, AJCC 8th Edition - Clinical stage from 09/25/2020: Stage IIB (cT2, cN0, cM0, G3, ER+, PR-, HER2-) - Signed by Nicholas Lose, MD on 09/25/2020 Stage prefix: Initial diagnosis Histologic grading system: 3 grade system   SUMMARY OF ONCOLOGIC HISTORY: Oncology History  Malignant neoplasm of upper-outer quadrant of left breast in female, estrogen receptor positive ([place])  09/25/2020 Initial Diagnosis   Palpable lump in the left breast.  Mammogram revealed 3 cm tumor and a possible 5 mm satellite nodule.  Biopsy revealed grade 3 IDC ER 40% weak, PR 0%, HER2 negative, Ki-67 60%   09/25/2020 Cancer Staging   Staging form: Breast, AJCC 8th Edition - Clinical stage from 09/25/2020: Stage IIB (cT2, cN0, cM0, G3, ER+, PR-, HER2-) - Signed by Nicholas Lose, MD on 09/25/2020 Stage prefix: Initial diagnosis Histologic grading system: 3 grade system    10/02/2020 Genetic Testing   Negative hereditary cancer genetic testing: no pathogenic variants detected in [facility] BRCAPlus Panel and [facility] CancerNext-Expanded +RNAinsight Panel.  The report dates are October 02, 2020 and October 13, 2020, respectively.   The BRCAplus panel offered by [employer] and includes sequencing and deletion/duplication analysis for the following 8 genes: ATM, BRCA1, BRCA2, CDH1, CHEK2, PALB2, PTEN, and TP53.  The CancerNext-Expanded gene panel offered by [facility] and includes sequencing, rearrangement, and RNA analysis for the following 77 genes: AIP, ALK, APC, ATM, AXIN2, BAP1, BARD1, BLM, BMPR1A, BRCA1, BRCA2, BRIP1, CDC73, CDH1, CDK4, CDKN1B, CDKN2A, CHEK2, CTNNA1, DICER1, FANCC, FH, FLCN, GALNT12, KIF1B, LZTR1, MAX, MEN1, MET, MLH1, MSH2, MSH3, MSH6,  MUTYH, NBN, NF1, NF2, NTHL1, PALB2, PHOX2B, PMS2, POT1, PRKAR1A, PTCH1, PTEN, RAD51C, RAD51D, RB1, RECQL, RET, SDHA, SDHAF2, SDHB, SDHC, SDHD, SMAD4, SMARCA4, SMARCB1, SMARCE1, STK11, SUFU, TMEM127, TP53, TSC1, TSC2, VHL and XRCC2 (sequencing and deletion/duplication); EGFR, EGLN1, HOXB13, KIT, MITF, PDGFRA, POLD1, and POLE (sequencing only); EPCAM and GREM1 (deletion/duplication only).    11/03/2020 -  Chemotherapy   Patient is on Treatment Plan : BREAST ADJUVANT DOSE DENSE AC q14d / PACLitaxel switched to Abraxane (due to rxn) q7d        CURRENT THERAPY: Abraxane week 11  INTERVAL HISTORY: Allison Dodson 26 y.o. female returns for evaluation prior to receiving her 11th week of Abraxane.  She underwent breast MRI on February 23 that showed residual cancer in the left breast of 1.3 x 1.1 x 0.6 cm which is smaller than prior.  Satellite nodule was also decreased in size.  There was no evidence of malignancy on the right.  Allison Dodson tells me that she is scheduled for surgery on April 4 for bilateral mastectomies.  She has opted to forego reconstruction.  Allison Dodson is tolerating the Abraxane very well.  She denies any peripheral neuropathy, nausea vomiting, or significant fatigue.   Patient Active Problem List   Diagnosis Date Noted   Port-A-Cath in place 12/29/2020   Genetic testing 10/05/2020   Malignant neoplasm of upper-outer quadrant of left breast in female, estrogen receptor positive ([place]) 09/25/2020   Family history of breast cancer 09/25/2020    is allergic to paclitaxel.  MEDICAL HISTORY: Past Medical History:  Diagnosis Date   Cancer [facility])    Family history of breast cancer 09/25/2020    SURGICAL HISTORY: Past Surgical History:  Procedure Laterality Date   PORTACATH PLACEMENT N/A 11/02/2020   Procedure: INSERTION PORT-A-CATH;  Surgeon: Rolm Bookbinder, MD;  Location: [facility];  Service: General;  Laterality: N/A;    SOCIAL HISTORY: Social History   Socioeconomic History    Marital status: Married    Spouse name: Not on file   Number of children: Not on file   Years of education: Not on file   Highest education level: Not on file  Occupational History   Not on file  Tobacco Use   Smoking status: Never   Smokeless tobacco: Never  Vaping Use   Vaping Use: Never used  Substance and Sexual Activity   Alcohol use: Yes    Comment: soc.   Drug use: Never   Sexual activity: Not on file  Other Topics Concern   Not on file  Social History Narrative   Not on file   Social Determinants of Health   Financial Resource Strain: Not on file  Food Insecurity: Not on file  Transportation Needs: Not on file  Physical Activity: Not on file  Stress: Not on file  Social Connections: Not on file  Intimate Partner Violence: Not on file    FAMILY HISTORY: Family History  Problem Relation Age of Onset   Liver cancer Maternal Aunt 55   Breast cancer Maternal Aunt 59   Breast cancer Maternal Grandmother 29   Lung cancer Paternal Grandfather 58       smoking hx   Breast cancer Other        MGM's sister & nieces   Breast cancer Cousin 82       triple negative    Review of Systems  Constitutional:  Negative for appetite change, chills, fatigue, fever and unexpected weight change.  HENT:   Negative for hearing loss, lump/mass and trouble swallowing.   Eyes:  Negative for eye problems and icterus.  Respiratory:  Negative for chest tightness, cough and shortness of breath.   Cardiovascular:  Negative for chest pain, leg swelling and palpitations.  Gastrointestinal:  Negative for abdominal distention, abdominal pain, constipation, diarrhea, nausea and vomiting.  Endocrine: Negative for hot flashes.  Genitourinary:  Negative for difficulty urinating.   Musculoskeletal:  Negative for arthralgias.  Skin:  Negative for itching and rash.  Neurological:  Negative for dizziness, extremity weakness, headaches and numbness.  Hematological:  Negative for adenopathy. Does  not bruise/bleed easily.  Psychiatric/Behavioral:  Negative for depression. The patient is not nervous/anxious.      PHYSICAL EXAMINATION  ECOG PERFORMANCE STATUS: 1 - Symptomatic but completely ambulatory  Vitals:   03/23/21 1011  BP: 129/75  Pulse: 88  Resp: 16  Temp: 97.9 F (36.6 C)  SpO2: 100%    Physical Exam Constitutional:      General: She is not in acute distress.    Appearance: Normal appearance. She is not toxic-appearing.  HENT:     Head: Normocephalic and atraumatic.  Eyes:     General: No scleral icterus. Cardiovascular:     Rate and Rhythm: Normal rate and regular rhythm.     Pulses: Normal pulses.     Heart sounds: Normal heart sounds.  Pulmonary:     Effort: Pulmonary effort is normal.     Breath sounds: Normal breath sounds.  Abdominal:     General: Abdomen is flat. Bowel sounds are normal. There is no distension.     Palpations: Abdomen is soft.     Tenderness: There is no abdominal tenderness.  Musculoskeletal:  General: No swelling.     Cervical back: Neck supple.  Lymphadenopathy:     Cervical: No cervical adenopathy.  Skin:    General: Skin is warm and dry.     Findings: No rash.  Neurological:     General: No focal deficit present.     Mental Status: She is alert.  Psychiatric:        Mood and Affect: Mood normal.        Behavior: Behavior normal.    LABORATORY DATA:  CBC    Component Value Date/Time   WBC 3.4 (L) 03/23/2021 0958   WBC 8.6 10/13/2020 1131   RBC 3.77 (L) 03/23/2021 0958   HGB 10.8 (L) 03/23/2021 0958   HCT 32.9 (L) 03/23/2021 0958   PLT 318 03/23/2021 0958   MCV 87.3 03/23/2021 0958   MCH 28.6 03/23/2021 0958   MCHC 32.8 03/23/2021 0958   RDW 15.4 03/23/2021 0958   LYMPHSABS 0.9 03/23/2021 0958   MONOABS 0.2 03/23/2021 0958   EOSABS 0.0 03/23/2021 0958   BASOSABS 0.0 03/23/2021 0958    CMP     Component Value Date/Time   NA 138 03/23/2021 0958   K 3.8 03/23/2021 0958   CL 106 03/23/2021 0958    CO2 26 03/23/2021 0958   GLUCOSE 99 03/23/2021 0958   BUN 10 03/23/2021 0958   CREATININE 0.44 03/23/2021 0958   CALCIUM 9.6 03/23/2021 0958   PROT 7.3 03/23/2021 0958   ALBUMIN 4.2 03/23/2021 0958   AST 21 03/23/2021 0958   ALT 36 03/23/2021 0958   ALKPHOS 57 03/23/2021 0958   BILITOT 0.3 03/23/2021 0958   GFRNONAA >60 03/23/2021 0958      ASSESSMENT and THERAPY PLAN:   Malignant neoplasm of upper-outer quadrant of left breast in female, estrogen receptor positive ([place]) 09/22/2020: Palpable mass in the left breast corresponding to suspicious mass on mammogram measuring 3 cm, possible 5 mm satellite nodule, left axilla negative, right breast negative ER 40% weak, PR negative, HER2 negative, Ki-67 60%   Breast MRI 09/28/2020: 3.7 cm enhancing mass at 3 o'clock position left breast with a possible 7 mm satellite nodule, no abnormal lymph nodes.   Treatment plan: 1.  Neoadjuvant chemotherapy with dose dense Adriamycin and Cytoxan followed by Taxol weekly x12 2. followed by mastectomy (patient's preference) 2. adjuvant radiation therapy (she will not need radiation if she has a mastectomy) 3.  Followed by adjuvant antiestrogen therapy with ovarian function suppression and aromatase inhibitor therapy Patient had her wedding on 10/22/2020 ---------------------------------------------------------------------------------------------------------------------------------------- Current treatment: Completed 4 cycles of dose dense Adriamycin and Cytoxan, today cycle 11 Abraxane Echocardiogram 10/13/2020: EF 60 to 65%   Chemo toxicities: Fatigue: Mild Chemotherapy-induced anemia: Monitoring closely today's hemoglobin is 10.8 3.  Allergic reaction to Taxol: Switched to Abraxane.     She will proceed with treatment today.  She has no signs of peripheral neuropathy which we are monitoring her closely for.    All questions were answered. The patient knows to call the clinic with any problems,  questions or concerns. We can certainly see the patient much sooner if necessary.  Total encounter time: 20 minutes in face-to-face visit time, chart review, lab review, care coordination, order entry, and documentation of the encounter.  Allison Bihari, NP 03/23/21 12:38 PM Medical Oncology and Hematology [facility] [place], [place] [address] Tel. [phone]    Fax. [phone]  *Total Encounter Time as defined by the Centers for Medicare and Medicaid Services  includes, in addition to the face-to-face time of a patient visit (documented in the note above) non-face-to-face time: obtaining and reviewing outside history, ordering and reviewing medications, tests or procedures, care coordination (communications with other health care professionals or caregivers) and documentation in the medical record.

## 2021-03-24 NOTE — Telephone Encounter (Signed)
Patient informed, message sent to appointment desk to schedule. ?

## 2021-03-25 NOTE — Telephone Encounter (Signed)
Patient scheduled on 03/26/21 ?

## 2021-03-25 NOTE — Progress Notes (Signed)
? ?Patient Care Team: ?Kerry Dory, NP as PCP - General (Nurse Practitioner) ?Rockwell Germany, RN as Oncology Nurse Navigator ?Mauro Kaufmann, RN as Oncology Nurse Navigator ?Nicholas Lose, MD as Consulting Physician (Hematology and Oncology) ?Rolm Bookbinder, MD as Consulting Physician (General Surgery) ? ?DIAGNOSIS:  ?Encounter Diagnosis  ?Name Primary?  ? Malignant neoplasm of upper-outer quadrant of left breast in female, estrogen receptor positive ([place])   ? ? ?SUMMARY OF ONCOLOGIC HISTORY: ?Oncology History  ?Malignant neoplasm of upper-outer quadrant of left breast in female, estrogen receptor positive ([place])  ?09/25/2020 Initial Diagnosis  ? Palpable lump in the left breast.  Mammogram revealed 3 cm tumor and a possible 5 mm satellite nodule.  Biopsy revealed grade 3 IDC ER 40% weak, PR 0%, HER2 negative, Ki-67 60% ?  ?09/25/2020 Cancer Staging  ? Staging form: Breast, AJCC 8th Edition ?- Clinical stage from 09/25/2020: Stage IIB (cT2, cN0, cM0, G3, ER+, PR-, HER2-) - Signed by Nicholas Lose, MD on 09/25/2020 ?Stage prefix: Initial diagnosis ?Histologic grading system: 3 grade system ? ?  ?10/02/2020 Genetic Testing  ? Negative hereditary cancer genetic testing: no pathogenic variants detected in [facility] BRCAPlus Panel and [facility] CancerNext-Expanded +RNAinsight Panel.  The report dates are October 02, 2020 and October 13, 2020, respectively.  ? ?The BRCAplus panel offered by [employer] and includes sequencing and deletion/duplication analysis for the following 8 genes: ATM, BRCA1, BRCA2, CDH1, CHEK2, PALB2, PTEN, and TP53.  The CancerNext-Expanded gene panel offered by [facility] and includes sequencing, rearrangement, and RNA analysis for the following 77 genes: AIP, ALK, APC, ATM, AXIN2, BAP1, BARD1, BLM, BMPR1A, BRCA1, BRCA2, BRIP1, CDC73, CDH1, CDK4, CDKN1B, CDKN2A, CHEK2, CTNNA1, DICER1, FANCC, FH, FLCN, GALNT12, KIF1B, LZTR1, MAX, MEN1, MET, MLH1, MSH2, MSH3, MSH6, MUTYH, NBN, NF1, NF2,  NTHL1, PALB2, PHOX2B, PMS2, POT1, PRKAR1A, PTCH1, PTEN, RAD51C, RAD51D, RB1, RECQL, RET, SDHA, SDHAF2, SDHB, SDHC, SDHD, SMAD4, SMARCA4, SMARCB1, SMARCE1, STK11, SUFU, TMEM127, TP53, TSC1, TSC2, VHL and XRCC2 (sequencing and deletion/duplication); EGFR, EGLN1, HOXB13, KIT, MITF, PDGFRA, POLD1, and POLE (sequencing only); EPCAM and GREM1 (deletion/duplication only).  ?  ?11/03/2020 -  Chemotherapy  ? Patient is on Treatment Plan : BREAST ADJUVANT DOSE DENSE AC q14d / PACLitaxel switched to Abraxane (due to rxn) q7d  ?  ?  ? ? ?CHIEF COMPLIANT: Cycle 12 Abraxane ? ?INTERVAL HISTORY: Allison Dodson is a a 26 y.o. with above-mentioned history of breast cancer, currently undergoing dose dense Adriamycin and Cytoxan followed by Taxol. She presents to the clinic today for a toxicity check and treatment. She states today that everything went well after treatment.  She is planning on getting mastectomy done on 04/20/2021. ?Denies any significant peripheral neuropathy.  Denies any nausea or vomiting.  Today is her last chemotherapy treatment. ? ?ALLERGIES:  is allergic to paclitaxel. ? ?MEDICATIONS:  ?Current Outpatient Medications  ?Medication Sig Dispense Refill  ? ALPRAZolam (XANAX) 0.25 MG tablet Take by mouth.    ? lidocaine-prilocaine (EMLA) cream Apply to affected area once 30 g 3  ? ?No current facility-administered medications for this visit.  ? ? ?PHYSICAL EXAMINATION: ?ECOG PERFORMANCE STATUS: 1 - Symptomatic but completely ambulatory ? ?Vitals:  ? 03/29/21 0846  ?BP: (!) 146/83  ?Pulse: 97  ?Resp: 18  ?Temp: 97.7 ?F (36.5 ?C)  ?SpO2: 100%  ? ?Filed Weights  ? 03/29/21 0846  ?Weight: 235 lb 1.6 oz (106.6 kg)  ? ?  ? ?LABORATORY DATA:  ?I have reviewed the data as listed ?CMP Latest Ref  Rng & Units 03/23/2021 03/16/2021 03/09/2021  ?Glucose 70 - 99 mg/dL 99 134(H) 110(H)  ?BUN 6 - 20 mg/dL _0 ?Creatinine 0.44 - 1.00 mg/dL 0.44 0.51 0.50  ?Sodium 135 - 145 mmol/L 138 140 139  ?Potassium 3.5 - 5.1 mmol/L 3.8 3.7 3.9   ?Chloride 98 - 111 mmol/L 106 106 107  ?CO2 22 - 32 mmol/L _1 ?Calcium 8.9 - 10.3 mg/dL 9.6 9.4 9.1  ?Total Protein 6.5 - 8.1 g/dL 7.3 7.1 6.9  ?Total Bilirubin 0.3 - 1.2 mg/dL 0.3 0.3 0.3  ?Alkaline Phos 38 - 126 U/L 57 57 61  ?AST 15 - 41 U/L _2 ?ALT 0 - 44 U/L 36 41 39  ? ? ?Lab Results  ?Component Value Date  ? WBC 3.0 (L) 03/29/2021  ? HGB 10.7 (L) 03/29/2021  ? HCT 32.2 (L) 03/29/2021  ? MCV 87.0 03/29/2021  ? PLT 305 03/29/2021  ? NEUTROABS 1.8 03/29/2021  ? ? ?ASSESSMENT & PLAN:  ?Malignant neoplasm of upper-outer quadrant of left breast in female, estrogen receptor positive ([place]) ?09/22/2020: Palpable mass in the left breast corresponding to suspicious mass on mammogram measuring 3 cm, possible 5 mm satellite nodule, left axilla negative, right breast negative ?ER 40% weak, PR negative, HER2 negative, Ki-67 60% ?  ?Breast MRI 09/28/2020: 3.7 cm enhancing mass at 3 o'clock position left breast with a possible 7 mm satellite nodule, no abnormal lymph nodes. ?  ?Treatment plan: ?1.  Neoadjuvant chemotherapy with dose dense Adriamycin and Cytoxan followed by Taxol weekly x12 completed 03/29/2021 ?2. followed by mastectomy (patient's preference) ?2. adjuvant radiation therapy (she will not need radiation if she has a mastectomy) ?3.  Followed by adjuvant antiestrogen therapy with ovarian function suppression and aromatase inhibitor therapy ?Patient had her wedding on 10/22/2020 ?---------------------------------------------------------------------------------------------------------------------------------------- ?Breast MRI 03/11/2021: Marked reduction in the biopsy-proven malignancy currently measuring 1.3 cm, decrease in the size of the adjacent satellite nodule now measuring 5 mm ? ?Return to clinic after surgery to discuss the final pathology report ? ? ? ?No orders of the defined types were placed in this encounter. ? ?The patient has a good understanding of the overall plan. she agrees with it.  she will call with any problems that may develop before the next visit here. ?Total time spent: 30 mins including face to face time and time spent for planning, charting and co-ordination of care ? ? Harriette Ohara, MD ?03/29/21 ? ?I Gardiner Coins is acting as a Education administrator for Dr Lindi Adie  ?  ?

## 2021-03-26 ENCOUNTER — Other Ambulatory Visit: Payer: Self-pay

## 2021-03-26 ENCOUNTER — Ambulatory Visit (INDEPENDENT_AMBULATORY_CARE_PROVIDER_SITE_OTHER): Payer: No Typology Code available for payment source | Admitting: Radiology

## 2021-03-26 ENCOUNTER — Encounter: Payer: Self-pay | Admitting: Radiology

## 2021-03-26 ENCOUNTER — Other Ambulatory Visit (HOSPITAL_COMMUNITY)
Admission: RE | Admit: 2021-03-26 | Discharge: 2021-03-26 | Disposition: A | Discharge status: Home / Self Care | Payer: No Typology Code available for payment source | Source: Ambulatory Visit | Attending: Radiology | Admitting: Radiology

## 2021-03-26 VITALS — BP 122/82 | Ht 63.0 in | Wt 230.0 lb

## 2021-03-26 DIAGNOSIS — Z01419 Encounter for gynecological examination (general) (routine) without abnormal findings: Secondary | ICD-10-CM

## 2021-03-26 DIAGNOSIS — Z170 Estrogen receptor positive status [ER+]: Secondary | ICD-10-CM

## 2021-03-26 DIAGNOSIS — C50412 Malignant neoplasm of upper-outer quadrant of left female breast: Secondary | ICD-10-CM | POA: Diagnosis not present

## 2021-03-26 NOTE — Progress Notes (Signed)
? ?  Allison Dodson [birth year] [id number] ? ? ?History:  26 y.o. G0 presents for annual exam. Diagnosed with estrogen receptor positive left breast cancer 09/2020. Started chemo 10/22 after her wedding. Her double mastectomy without reconstruction scheduled for 04/20/21. Doing well otherwise, tolerating this round of chemo well. She was not interested in egg freezing as she is not interested in having a genetic child because of her strong family history of breast cancer.  ? ?Gynecologic History ?No LMP recorded. ?Period Cycle (Days):  (amenorrhea since starting chemo 12/2020) ?Contraception/Family planning: condoms ?Sexually active: yes ?Last Pap: age 19. Results were: normal ?Last mammogram: 2022. Results were: abnormal ? ?Obstetric History ?OB History  ?Gravida Para Term Preterm AB Living  ?0 0 0 0 0 0  ?SAB IAB Ectopic Multiple Live Births  ?0 0 0 0 0  ? ? ? ?The following portions of the patient's history were reviewed and updated as appropriate: allergies, current medications, past family history, past medical history, past social history, past surgical history, and problem list. ? ?Review of Systems ?Pertinent items noted in HPI and remainder of comprehensive ROS otherwise negative.  ? ?Past medical history, past surgical history, family history and social history were all reviewed and documented in the EPIC chart. ? ? ?Exam: ? ?Vitals:  ? 03/26/21 1122  ?BP: 122/82  ?Weight: 230 lb (104.3 kg)  ?Height: '5\' 3"'$  (1.6 m)  ? ?Body mass index is 40.74 kg/m?. ? ?General appearance:  Normal ?Thyroid:  Symmetrical, normal in size, without palpable masses or nodularity. ?Respiratory ? Auscultation:  Clear without wheezing or rhonchi ?Cardiovascular ? Auscultation:  Regular rate, without rubs, murmurs or gallops ? Edema/varicosities:  Not grossly evident ?Abdominal ? Soft,nontender, without masses, guarding or rebound. ? Liver/spleen:  No organomegaly noted ? Hernia:  None appreciated ? Skin ? Inspection:  Grossly normal ?Breasts:  Examined lying and sitting.  ? Right: Without masses, retractions, nipple discharge or axillary adenopathy. ? ? Left: Without masses, retractions, nipple discharge or axillary adenopathy. ?Genitourinary  ? Inguinal/mons:  Normal without inguinal adenopathy ? External genitalia:  Normal appearing vulva with no masses, tenderness, or lesions ? BUS/Urethra/Skene's glands:  Normal without masses or exudate ? Vagina:  Normal appearing with normal color and discharge, no lesions ? Cervix:  Normal appearing without discharge or lesions ? Uterus:  Normal in size, shape and contour.  Mobile, nontender ? Adnexa/parametria:   ?  Rt: Normal in size, without masses or tenderness. ?  Lt: Normal in size, without masses or tenderness. ? Anus and perineum: Normal ?  ?Patient informed chaperone available to be present for breast and pelvic exam. Patient has requested no chaperone to be present. Patient has been advised what will be completed during breast and pelvic exam.  ? ?Assessment/Plan:   ?1. Well woman exam with routine gynecological exam ? ?- Cytology - PAP( [facility]) ? ?2. Malignant neoplasm of upper-outer quadrant of left breast in female, estrogen receptor positive ([place]) ? ? ?Pap screening as directed/appropriate. Recommend 15mns of exercise weekly, including weight bearing exercise. Encouraged the use of seatbelts and sunscreen. ?Return in 1 year for annual or as needed.  ? ?CKerry DoryWHNP-BC 11:53 AM 03/26/2021  ?

## 2021-03-29 ENCOUNTER — Inpatient Hospital Stay: Payer: No Typology Code available for payment source

## 2021-03-29 ENCOUNTER — Other Ambulatory Visit: Payer: Self-pay

## 2021-03-29 ENCOUNTER — Encounter: Payer: Self-pay | Admitting: *Deleted

## 2021-03-29 ENCOUNTER — Inpatient Hospital Stay (HOSPITAL_BASED_OUTPATIENT_CLINIC_OR_DEPARTMENT_OTHER): Payer: No Typology Code available for payment source | Admitting: Hematology and Oncology

## 2021-03-29 DIAGNOSIS — Z5111 Encounter for antineoplastic chemotherapy: Secondary | ICD-10-CM | POA: Diagnosis not present

## 2021-03-29 DIAGNOSIS — C50412 Malignant neoplasm of upper-outer quadrant of left female breast: Secondary | ICD-10-CM

## 2021-03-29 DIAGNOSIS — Z170 Estrogen receptor positive status [ER+]: Secondary | ICD-10-CM

## 2021-03-29 DIAGNOSIS — Z95828 Presence of other vascular implants and grafts: Secondary | ICD-10-CM

## 2021-03-29 LAB — CMP (CANCER CENTER ONLY)
ALT: 34 U/L (ref 0–44)
AST: 18 U/L (ref 15–41)
Albumin: 4 g/dL (ref 3.5–5.0)
Alkaline Phosphatase: 61 U/L (ref 38–126)
Anion gap: 7 (ref 5–15)
BUN: 11 mg/dL (ref 6–20)
CO2: 25 mmol/L (ref 22–32)
Calcium: 9.2 mg/dL (ref 8.9–10.3)
Chloride: 107 mmol/L (ref 98–111)
Creatinine: 0.5 mg/dL (ref 0.44–1.00)
GFR, Estimated: >60 mL/min (ref >60)
Glucose, Bld: 114 mg/dL — ABNORMAL HIGH (ref 70–99)
Potassium: 3.9 mmol/L (ref 3.5–5.1)
Sodium: 139 mmol/L (ref 135–145)
Total Bilirubin: 0.3 mg/dL (ref 0.3–1.2)
Total Protein: 6.6 g/dL (ref 6.5–8.1)

## 2021-03-29 LAB — CBC WITH DIFFERENTIAL (CANCER CENTER ONLY)
Abs Immature Granulocytes: 0.01 10*3/uL (ref 0.00–0.07)
Basophils Absolute: 0 10*3/uL (ref 0.0–0.1)
Basophils Relative: 1 %
Eosinophils Absolute: 0 10*3/uL (ref 0.0–0.5)
Eosinophils Relative: 1 %
HCT: 32.2 % — ABNORMAL LOW (ref 36.0–46.0)
Hemoglobin: 10.7 g/dL — ABNORMAL LOW (ref 12.0–15.0)
Immature Granulocytes: 0 %
Lymphocytes Relative: 31 %
Lymphs Abs: 0.9 10*3/uL (ref 0.7–4.0)
MCH: 28.9 pg (ref 26.0–34.0)
MCHC: 33.2 g/dL (ref 30.0–36.0)
MCV: 87 fL (ref 80.0–100.0)
Monocytes Absolute: 0.2 10*3/uL (ref 0.1–1.0)
Monocytes Relative: 6 %
Neutro Abs: 1.8 10*3/uL (ref 1.7–7.7)
Neutrophils Relative %: 61 %
Platelet Count: 305 10*3/uL (ref 150–400)
RBC: 3.7 MIL/uL — ABNORMAL LOW (ref 3.87–5.11)
RDW: 15.4 % (ref 11.5–15.5)
WBC Count: 3 10*3/uL — ABNORMAL LOW (ref 4.0–10.5)
nRBC: 0 % (ref 0.0–0.2)

## 2021-03-29 LAB — CYTOLOGY - PAP: Diagnosis: NEGATIVE

## 2021-03-29 MED ORDER — HEPARIN SOD (PORK) LOCK FLUSH 100 UNIT/ML IV SOLN
500.0000 [IU] | Freq: Once | INTRAVENOUS | Status: AC | PRN
  Administered 2021-03-29: 500 [IU]

## 2021-03-29 MED ORDER — SODIUM CHLORIDE 0.9% FLUSH
10.0000 mL | Freq: Once | INTRAVENOUS | Status: AC
  Administered 2021-03-29: 10 mL

## 2021-03-29 MED ORDER — SODIUM CHLORIDE 0.9% FLUSH
10.0000 mL | INTRAVENOUS | Status: DC | PRN
  Administered 2021-03-29: 10 mL

## 2021-03-29 MED ORDER — ONDANSETRON HCL 4 MG/2ML IJ SOLN
8.0000 mg | Freq: Once | INTRAMUSCULAR | Status: AC
  Administered 2021-03-29: 8 mg via INTRAVENOUS
  Filled 2021-03-29: qty 4

## 2021-03-29 MED ORDER — SODIUM CHLORIDE 0.9 % IV SOLN: Freq: Once | INTRAVENOUS | Status: AC

## 2021-03-29 MED ORDER — PACLITAXEL PROTEIN-BOUND CHEMO INJECTION 100 MG
80.0000 mg/m2 | Freq: Once | INTRAVENOUS | Status: AC
  Administered 2021-03-29: 175 mg via INTRAVENOUS
  Filled 2021-03-29: qty 35

## 2021-03-29 NOTE — Patient Instructions (Addendum)
[facility]   ?Happy Last Treatment Day! ?Discharge Instructions: ?Thank you for choosing [facility] to provide your oncology and hematology care.  ? ?If you have a lab appointment with the [facility], please go directly to the [facility] and check in at the registration area. ?  ?Wear comfortable clothing and clothing appropriate for easy access to any Portacath or PICC line.  ? ?We strive to give you quality time with your provider. You may need to reschedule your appointment if you arrive late (15 or more minutes).  Arriving late affects you and other patients whose appointments are after yours.  Also, if you miss three or more appointments without notifying the office, you may be dismissed from the clinic at the provider?s discretion.    ?  ?For prescription refill requests, have your pharmacy contact our office and allow 72 hours for refills to be completed.   ? ?Today you received the following chemotherapy and/or immunotherapy agents: Paclitaxel protein-bound (Abraxane)    ?  ?To help prevent nausea and vomiting after your treatment, we encourage you to take your nausea medication as directed. ? ?BELOW ARE SYMPTOMS THAT SHOULD BE REPORTED IMMEDIATELY: ?*FEVER GREATER THAN 100.4 F (38 ?C) OR HIGHER ?*CHILLS OR SWEATING ?*NAUSEA AND VOMITING THAT IS NOT CONTROLLED WITH YOUR NAUSEA MEDICATION ?*UNUSUAL SHORTNESS OF BREATH ?*UNUSUAL BRUISING OR BLEEDING ?*URINARY PROBLEMS (pain or burning when urinating, or frequent urination) ?*BOWEL PROBLEMS (unusual diarrhea, constipation, pain near the anus) ?TENDERNESS IN MOUTH AND THROAT WITH OR WITHOUT PRESENCE OF ULCERS (sore throat, sores in mouth, or a toothache) ?UNUSUAL RASH, SWELLING OR PAIN  ?UNUSUAL VAGINAL DISCHARGE OR ITCHING  ? ?Items with * indicate a potential emergency and should be followed up as soon as possible or go to the Emergency Department if any problems should occur. ? ?Please show the CHEMOTHERAPY  ALERT CARD or IMMUNOTHERAPY ALERT CARD at check-in to the Emergency Department and triage nurse. ? ?Should you have questions after your visit or need to cancel or reschedule your appointment, please contact [facility]  Dept: [phone]  and follow the prompts.  Office hours are 8:00 a.m. to 4:30 p.m. Monday - Friday. Please note that voicemails left after 4:00 p.m. may not be returned until the following business day.  We are closed weekends and major holidays. You have access to a nurse at all times for urgent questions. Please call the main number to the clinic Dept: [phone] and follow the prompts. ? ? ?For any non-urgent questions, you may also contact your provider using MyChart. We now offer e-Visits for anyone 68 and older to request care online for non-urgent symptoms. For details visit [email or web address]. ?  ?Also download the MyChart app! Go to the app store, search "MyChart", open the app, select [facility], and log in with your MyChart username and password. ? ?Due to Covid, a mask is required upon entering the hospital/clinic. If you do not have a mask, one will be given to you upon arrival. For doctor visits, patients may have 1 support person aged 60 or older with them. For treatment visits, patients cannot have anyone with them due to current Covid guidelines and our immunocompromised population.  ? ?

## 2021-03-29 NOTE — Progress Notes (Signed)
Patient utilizing preventive compression therapy with abraxane treatment to hands and feet.  ?

## 2021-03-29 NOTE — Assessment & Plan Note (Signed)
09/22/2020: Palpable mass in the left breast corresponding to suspicious mass on mammogram measuring 3 cm, possible 5 mm satellite nodule, left axilla negative, right breast negative ?ER 40% weak, PR negative, HER2 negative, Ki-67 60% ?? ?Breast MRI 09/28/2020: 3.7 cm enhancing mass at 3 o'clock position left breast with a possible 7 mm satellite nodule, no abnormal lymph nodes. ?? ?Treatment plan: ?1.??Neoadjuvant chemotherapy with dose dense Adriamycin and Cytoxan followed by Taxol weekly x12 completed 03/23/2021 ?2.?followed by mastectomy (patient's preference) ?2.?adjuvant radiation therapy?(she will not need radiation if she has a mastectomy) ?3.??Followed by adjuvant antiestrogen therapy with ovarian function suppression and aromatase inhibitor therapy ?Patient had her wedding on 10/22/2020 ?---------------------------------------------------------------------------------------------------------------------------------------- ?Breast MRI 03/11/2021: Marked reduction in the biopsy-proven malignancy currently measuring 1.3 cm, decrease in the size of the adjacent satellite nodule now measuring 5 mm ? ?Return to clinic after surgery to discuss the final pathology report ?

## 2021-03-30 ENCOUNTER — Telehealth: Payer: Self-pay

## 2021-03-30 ENCOUNTER — Telehealth: Payer: Self-pay | Admitting: Hematology and Oncology

## 2021-03-30 NOTE — Telephone Encounter (Signed)
Scheduled appointment per 3/13 los. Left message. Patient will be mailed an updated calendar. ?

## 2021-03-30 NOTE — Telephone Encounter (Signed)
Notified Patient of request for medical records by [employer] [facility]. Request forwarded to [facility] as urgent request. Fax transmission confirmation received. Copy of request mailed to Patient. No other needs or concerns voiced at this time. ?

## 2021-04-09 ENCOUNTER — Other Ambulatory Visit: Payer: Self-pay

## 2021-04-09 ENCOUNTER — Encounter (HOSPITAL_BASED_OUTPATIENT_CLINIC_OR_DEPARTMENT_OTHER): Payer: Self-pay | Admitting: General Surgery

## 2021-04-13 ENCOUNTER — Other Ambulatory Visit: Payer: Self-pay | Admitting: General Surgery

## 2021-04-20 ENCOUNTER — Encounter (HOSPITAL_BASED_OUTPATIENT_CLINIC_OR_DEPARTMENT_OTHER)
Admission: RE | Disposition: A | Discharge status: Home / Self Care | Payer: Self-pay | Source: Home / Self Care | Attending: General Surgery

## 2021-04-20 ENCOUNTER — Observation Stay (HOSPITAL_BASED_OUTPATIENT_CLINIC_OR_DEPARTMENT_OTHER)
Admission: RE | Admit: 2021-04-20 | Discharge: 2021-04-21 | Disposition: A | Discharge status: Home / Self Care | Payer: No Typology Code available for payment source | Attending: General Surgery | Admitting: General Surgery

## 2021-04-20 ENCOUNTER — Ambulatory Visit (HOSPITAL_BASED_OUTPATIENT_CLINIC_OR_DEPARTMENT_OTHER): Payer: No Typology Code available for payment source | Admitting: Anesthesiology

## 2021-04-20 ENCOUNTER — Other Ambulatory Visit: Payer: Self-pay

## 2021-04-20 ENCOUNTER — Encounter (HOSPITAL_BASED_OUTPATIENT_CLINIC_OR_DEPARTMENT_OTHER): Payer: Self-pay | Admitting: General Surgery

## 2021-04-20 DIAGNOSIS — C50912 Malignant neoplasm of unspecified site of left female breast: Secondary | ICD-10-CM | POA: Diagnosis present

## 2021-04-20 DIAGNOSIS — N6031 Fibrosclerosis of right breast: Secondary | ICD-10-CM | POA: Insufficient documentation

## 2021-04-20 DIAGNOSIS — N62 Hypertrophy of breast: Secondary | ICD-10-CM | POA: Diagnosis not present

## 2021-04-20 DIAGNOSIS — C50412 Malignant neoplasm of upper-outer quadrant of left female breast: Principal | ICD-10-CM | POA: Insufficient documentation

## 2021-04-20 DIAGNOSIS — C50812 Malignant neoplasm of overlapping sites of left female breast: Secondary | ICD-10-CM

## 2021-04-20 DIAGNOSIS — Z170 Estrogen receptor positive status [ER+]: Secondary | ICD-10-CM | POA: Diagnosis not present

## 2021-04-20 HISTORY — PX: TOTAL MASTECTOMY: SHX6129

## 2021-04-20 HISTORY — PX: PORT-A-CATH REMOVAL: SHX5289

## 2021-04-20 HISTORY — PX: MASTECTOMY W/ SENTINEL NODE BIOPSY: SHX2001

## 2021-04-20 LAB — POCT PREGNANCY, URINE: Preg Test, Ur: NEGATIVE

## 2021-04-20 SURGERY — MASTECTOMY WITH SENTINEL LYMPH NODE BIOPSY
Anesthesia: Regional | Site: Chest | Laterality: Right

## 2021-04-20 MED ORDER — FENTANYL CITRATE (PF) 100 MCG/2ML IJ SOLN
INTRAMUSCULAR | Status: DC | PRN
  Administered 2021-04-20: 100 ug via INTRAVENOUS

## 2021-04-20 MED ORDER — PROPOFOL 500 MG/50ML IV EMUL
INTRAVENOUS | Status: AC
  Filled 2021-04-20: qty 50

## 2021-04-20 MED ORDER — FENTANYL CITRATE (PF) 100 MCG/2ML IJ SOLN
50.0000 ug | Freq: Once | INTRAMUSCULAR | Status: AC
  Administered 2021-04-20: 50 ug via INTRAVENOUS

## 2021-04-20 MED ORDER — FENTANYL CITRATE (PF) 100 MCG/2ML IJ SOLN
INTRAMUSCULAR | Status: AC
  Filled 2021-04-20: qty 2

## 2021-04-20 MED ORDER — ACETAMINOPHEN 500 MG PO TABS
ORAL_TABLET | ORAL | Status: AC
  Filled 2021-04-20: qty 2

## 2021-04-20 MED ORDER — OXYCODONE HCL 5 MG PO TABS: 5.0000 mg | ORAL_TABLET | Freq: Four times a day (QID) | ORAL | 0 refills | Status: DC | PRN

## 2021-04-20 MED ORDER — ROCURONIUM BROMIDE 100 MG/10ML IV SOLN
INTRAVENOUS | Status: DC | PRN
  Administered 2021-04-20: 20 mg via INTRAVENOUS
  Administered 2021-04-20: 70 mg via INTRAVENOUS

## 2021-04-20 MED ORDER — METHOCARBAMOL 500 MG PO TABS
500.0000 mg | ORAL_TABLET | Freq: Four times a day (QID) | ORAL | Status: DC | PRN
Start: 2021-04-20 — End: 2021-04-21
  Administered 2021-04-21: 500 mg via ORAL
  Filled 2021-04-20: qty 1

## 2021-04-20 MED ORDER — CEFAZOLIN SODIUM-DEXTROSE 2-4 GM/100ML-% IV SOLN
INTRAVENOUS | Status: AC
  Filled 2021-04-20: qty 100

## 2021-04-20 MED ORDER — PROPOFOL 10 MG/ML IV BOLUS
INTRAVENOUS | Status: AC
  Filled 2021-04-20: qty 20

## 2021-04-20 MED ORDER — LIDOCAINE HCL (CARDIAC) PF 100 MG/5ML IV SOSY
PREFILLED_SYRINGE | INTRAVENOUS | Status: DC | PRN
  Administered 2021-04-20: 60 mg via INTRAVENOUS

## 2021-04-20 MED ORDER — DEXAMETHASONE SODIUM PHOSPHATE 10 MG/ML IJ SOLN
INTRAMUSCULAR | Status: AC
  Filled 2021-04-20: qty 1

## 2021-04-20 MED ORDER — PROPOFOL 500 MG/50ML IV EMUL
INTRAVENOUS | Status: DC | PRN
  Administered 2021-04-20: 150 ug/kg/min via INTRAVENOUS

## 2021-04-20 MED ORDER — KETAMINE HCL 10 MG/ML IJ SOLN
INTRAMUSCULAR | Status: DC | PRN
  Administered 2021-04-20 (×2): 20 mg via INTRAVENOUS

## 2021-04-20 MED ORDER — BUPIVACAINE HCL (PF) 0.25 % IJ SOLN
INTRAMUSCULAR | Status: AC
  Filled 2021-04-20: qty 120

## 2021-04-20 MED ORDER — HEPARIN (PORCINE) IN NACL 1000-0.9 UT/500ML-% IV SOLN
INTRAVENOUS | Status: AC
  Filled 2021-04-20: qty 1000

## 2021-04-20 MED ORDER — MAGTRACE LYMPHATIC TRACER
INTRAMUSCULAR | Status: DC | PRN
  Administered 2021-04-20: 2 mL via INTRAMUSCULAR

## 2021-04-20 MED ORDER — OXYCODONE HCL 5 MG PO TABS: 5.0000 mg | ORAL_TABLET | Freq: Once | ORAL | Status: DC | PRN

## 2021-04-20 MED ORDER — ONDANSETRON HCL 4 MG/2ML IJ SOLN: 4.0000 mg | Freq: Four times a day (QID) | INTRAMUSCULAR | Status: DC | PRN

## 2021-04-20 MED ORDER — LIDOCAINE 2% (20 MG/ML) 5 ML SYRINGE
INTRAMUSCULAR | Status: AC
  Filled 2021-04-20: qty 5

## 2021-04-20 MED ORDER — MIDAZOLAM HCL 2 MG/2ML IJ SOLN
INTRAMUSCULAR | Status: AC
  Filled 2021-04-20: qty 2

## 2021-04-20 MED ORDER — ENSURE PRE-SURGERY PO LIQD: 296.0000 mL | Freq: Once | ORAL | Status: DC

## 2021-04-20 MED ORDER — PHENYLEPHRINE 40 MCG/ML (10ML) SYRINGE FOR IV PUSH (FOR BLOOD PRESSURE SUPPORT)
PREFILLED_SYRINGE | INTRAVENOUS | Status: AC
  Filled 2021-04-20: qty 20

## 2021-04-20 MED ORDER — ACETAMINOPHEN 500 MG PO TABS
1000.0000 mg | ORAL_TABLET | Freq: Four times a day (QID) | ORAL | Status: DC
  Administered 2021-04-20 – 2021-04-21 (×2): 1000 mg via ORAL
  Filled 2021-04-20: qty 2

## 2021-04-20 MED ORDER — KETOROLAC TROMETHAMINE 15 MG/ML IJ SOLN
INTRAMUSCULAR | Status: AC
  Filled 2021-04-20: qty 1

## 2021-04-20 MED ORDER — MIDAZOLAM HCL 2 MG/2ML IJ SOLN
2.0000 mg | Freq: Once | INTRAMUSCULAR | Status: AC
  Administered 2021-04-20: 2 mg via INTRAVENOUS

## 2021-04-20 MED ORDER — SUGAMMADEX SODIUM 500 MG/5ML IV SOLN
INTRAVENOUS | Status: DC | PRN
  Administered 2021-04-20: 250 mg via INTRAVENOUS

## 2021-04-20 MED ORDER — CEFAZOLIN SODIUM-DEXTROSE 2-4 GM/100ML-% IV SOLN
2.0000 g | INTRAVENOUS | Status: AC
  Administered 2021-04-20: 2 g via INTRAVENOUS

## 2021-04-20 MED ORDER — CHLORHEXIDINE GLUCONATE CLOTH 2 % EX PADS: 6.0000 | MEDICATED_PAD | Freq: Once | CUTANEOUS | Status: DC

## 2021-04-20 MED ORDER — PROPOFOL 10 MG/ML IV BOLUS
INTRAVENOUS | Status: DC | PRN
  Administered 2021-04-20: 200 mg via INTRAVENOUS

## 2021-04-20 MED ORDER — SODIUM CHLORIDE 0.9 % IV SOLN: INTRAVENOUS | Status: DC

## 2021-04-20 MED ORDER — ONDANSETRON 4 MG PO TBDP: 4.0000 mg | ORAL_TABLET | Freq: Four times a day (QID) | ORAL | Status: DC | PRN

## 2021-04-20 MED ORDER — ACETAMINOPHEN 500 MG PO TABS: 1000.0000 mg | ORAL_TABLET | Freq: Once | ORAL | Status: DC | PRN

## 2021-04-20 MED ORDER — PHENYLEPHRINE HCL (PRESSORS) 10 MG/ML IV SOLN
INTRAVENOUS | Status: DC | PRN
  Administered 2021-04-20: 120 ug via INTRAVENOUS
  Administered 2021-04-20 (×3): 80 ug via INTRAVENOUS
  Administered 2021-04-20: 120 ug via INTRAVENOUS

## 2021-04-20 MED ORDER — LIDOCAINE-EPINEPHRINE 2 %-1:100000 IJ SOLN
INTRAMUSCULAR | Status: DC | PRN
  Administered 2021-04-20 (×2): 5 mL via PERINEURAL

## 2021-04-20 MED ORDER — ACETAMINOPHEN 160 MG/5ML PO SOLN: 1000.0000 mg | Freq: Once | ORAL | Status: DC | PRN

## 2021-04-20 MED ORDER — DEXAMETHASONE SODIUM PHOSPHATE 4 MG/ML IJ SOLN
INTRAMUSCULAR | Status: DC | PRN
Start: 2021-04-20 — End: 2021-04-20
  Administered 2021-04-20: 5 mg via INTRAVENOUS

## 2021-04-20 MED ORDER — SIMETHICONE 80 MG PO CHEW: 40.0000 mg | CHEWABLE_TABLET | Freq: Four times a day (QID) | ORAL | Status: DC | PRN

## 2021-04-20 MED ORDER — ONDANSETRON HCL 4 MG/2ML IJ SOLN
INTRAMUSCULAR | Status: AC
  Filled 2021-04-20: qty 2

## 2021-04-20 MED ORDER — MORPHINE SULFATE (PF) 4 MG/ML IV SOLN: 1.0000 mg | INTRAVENOUS | Status: DC | PRN

## 2021-04-20 MED ORDER — FENTANYL CITRATE (PF) 100 MCG/2ML IJ SOLN: 25.0000 ug | INTRAMUSCULAR | Status: DC | PRN

## 2021-04-20 MED ORDER — ROCURONIUM BROMIDE 10 MG/ML (PF) SYRINGE
PREFILLED_SYRINGE | INTRAVENOUS | Status: AC
Start: 2021-04-20 — End: ?
  Filled 2021-04-20: qty 10

## 2021-04-20 MED ORDER — ACETAMINOPHEN 500 MG PO TABS
1000.0000 mg | ORAL_TABLET | ORAL | Status: AC
  Administered 2021-04-20: 1000 mg via ORAL

## 2021-04-20 MED ORDER — SUGAMMADEX SODIUM 500 MG/5ML IV SOLN
INTRAVENOUS | Status: AC
Start: 2021-04-20 — End: ?
  Filled 2021-04-20: qty 5

## 2021-04-20 MED ORDER — OXYCODONE HCL 5 MG/5ML PO SOLN: 5.0000 mg | Freq: Once | ORAL | Status: DC | PRN

## 2021-04-20 MED ORDER — KETOROLAC TROMETHAMINE 15 MG/ML IJ SOLN
15.0000 mg | INTRAMUSCULAR | Status: AC
  Administered 2021-04-20: 15 mg via INTRAVENOUS

## 2021-04-20 MED ORDER — BUPIVACAINE-EPINEPHRINE (PF) 0.5% -1:200000 IJ SOLN
INTRAMUSCULAR | Status: DC | PRN
  Administered 2021-04-20 (×2): 25 mL via PERINEURAL

## 2021-04-20 MED ORDER — LACTATED RINGERS IV SOLN: INTRAVENOUS | Status: DC

## 2021-04-20 MED ORDER — ACETAMINOPHEN 10 MG/ML IV SOLN: 1000.0000 mg | Freq: Once | INTRAVENOUS | Status: DC | PRN

## 2021-04-20 MED ORDER — OXYCODONE HCL 5 MG PO TABS
5.0000 mg | ORAL_TABLET | ORAL | Status: DC | PRN
  Administered 2021-04-20: 5 mg via ORAL
  Filled 2021-04-20: qty 1

## 2021-04-20 MED ORDER — HEPARIN SOD (PORK) LOCK FLUSH 100 UNIT/ML IV SOLN
INTRAVENOUS | Status: AC
  Filled 2021-04-20: qty 10

## 2021-04-20 MED ORDER — KETAMINE HCL 50 MG/5ML IJ SOSY
PREFILLED_SYRINGE | INTRAMUSCULAR | Status: AC
Start: 2021-04-20 — End: ?
  Filled 2021-04-20: qty 5

## 2021-04-20 MED ORDER — ONDANSETRON HCL 4 MG/2ML IJ SOLN
INTRAMUSCULAR | Status: DC | PRN
  Administered 2021-04-20: 4 mg via INTRAVENOUS

## 2021-04-20 SURGICAL SUPPLY — 75 items
APPLIER CLIP 11 MED OPEN (CLIP)
APPLIER CLIP 9.375 MED OPEN (MISCELLANEOUS) ×4
BENZOIN TINCTURE PRP APPL 2/3 (GAUZE/BANDAGES/DRESSINGS) IMPLANT
BINDER BREAST LRG (GAUZE/BANDAGES/DRESSINGS) IMPLANT
BINDER BREAST MEDIUM (GAUZE/BANDAGES/DRESSINGS) IMPLANT
BINDER BREAST XLRG (GAUZE/BANDAGES/DRESSINGS) IMPLANT
BINDER BREAST XXLRG (GAUZE/BANDAGES/DRESSINGS) IMPLANT
BIOPATCH RED 1 DISK 7.0 (GAUZE/BANDAGES/DRESSINGS) ×2 IMPLANT
BLADE CLIPPER SURG (BLADE) IMPLANT
BLADE HEX COATED 2.75 (ELECTRODE) IMPLANT
BLADE SURG 10 STRL SS (BLADE) ×4 IMPLANT
BLADE SURG 15 STRL LF DISP TIS (BLADE) ×3 IMPLANT
BLADE SURG 15 STRL SS (BLADE) ×1
CANISTER SUCT 1200ML W/VALVE (MISCELLANEOUS) ×4 IMPLANT
CHLORAPREP W/TINT 26 (MISCELLANEOUS) ×8 IMPLANT
CLIP APPLIE 11 MED OPEN (CLIP) IMPLANT
CLIP APPLIE 9.375 MED OPEN (MISCELLANEOUS) IMPLANT
CLIP TI WIDE RED SMALL 6 (CLIP) IMPLANT
COVER BACK TABLE 60X90IN (DRAPES) ×4 IMPLANT
COVER MAYO STAND STRL (DRAPES) ×4 IMPLANT
COVER PROBE W GEL 5X96 (DRAPES) ×4 IMPLANT
DERMABOND ADVANCED (GAUZE/BANDAGES/DRESSINGS) ×4
DERMABOND ADVANCED .7 DNX12 (GAUZE/BANDAGES/DRESSINGS) ×3 IMPLANT
DRAIN CHANNEL 19F RND (DRAIN) ×5 IMPLANT
DRAPE LAPAROTOMY 100X72 PEDS (DRAPES) IMPLANT
DRAPE TOP ARMCOVERS (MISCELLANEOUS) ×4 IMPLANT
DRAPE U-SHAPE 76X120 STRL (DRAPES) ×4 IMPLANT
DRAPE UTILITY XL STRL (DRAPES) ×4 IMPLANT
DRSG PAD ABDOMINAL 8X10 ST (GAUZE/BANDAGES/DRESSINGS) ×9 IMPLANT
DRSG TEGADERM 4X4.75 (GAUZE/BANDAGES/DRESSINGS) ×2 IMPLANT
ELECT BLADE 4.0 EZ CLEAN MEGAD (MISCELLANEOUS)
ELECT COATED BLADE 2.86 ST (ELECTRODE) ×4 IMPLANT
ELECT REM PT RETURN 9FT ADLT (ELECTROSURGICAL) ×4
ELECTRODE BLDE 4.0 EZ CLN MEGD (MISCELLANEOUS) IMPLANT
ELECTRODE REM PT RTRN 9FT ADLT (ELECTROSURGICAL) ×3 IMPLANT
EVACUATOR SILICONE 100CC (DRAIN) ×5 IMPLANT
GAUZE SPONGE 4X4 12PLY STRL LF (GAUZE/BANDAGES/DRESSINGS) IMPLANT
GLOVE SURG ENC MOIS LTX SZ7 (GLOVE) ×6 IMPLANT
GLOVE SURG UNDER POLY LF SZ7.5 (GLOVE) ×4 IMPLANT
GOWN STRL REUS W/ TWL LRG LVL3 (GOWN DISPOSABLE) ×9 IMPLANT
GOWN STRL REUS W/TWL LRG LVL3 (GOWN DISPOSABLE) ×4
HEMOSTAT ARISTA ABSORB 3G PWDR (HEMOSTASIS) IMPLANT
LIGHT WAVEGUIDE WIDE FLAT (MISCELLANEOUS) ×3 IMPLANT
NDL HYPO 25X1 1.5 SAFETY (NEEDLE) ×3 IMPLANT
NDL SAFETY ECLIPSE 18X1.5 (NEEDLE) IMPLANT
NEEDLE HYPO 18GX1.5 SHARP (NEEDLE)
NEEDLE HYPO 25X1 1.5 SAFETY (NEEDLE) ×4 IMPLANT
NS IRRIG 1000ML POUR BTL (IV SOLUTION) ×4 IMPLANT
PACK BASIN DAY SURGERY FS (CUSTOM PROCEDURE TRAY) ×4 IMPLANT
PENCIL SMOKE EVACUATOR (MISCELLANEOUS) ×4 IMPLANT
PIN SAFETY STERILE (MISCELLANEOUS) ×4 IMPLANT
RETRACTOR ONETRAX LX 90X20 (MISCELLANEOUS) IMPLANT
SHEET MEDIUM DRAPE 40X70 STRL (DRAPES) IMPLANT
SLEEVE SCD COMPRESS KNEE MED (STOCKING) ×4 IMPLANT
SPIKE FLUID TRANSFER (MISCELLANEOUS) ×3 IMPLANT
SPONGE T-LAP 18X18 ~~LOC~~+RFID (SPONGE) ×6 IMPLANT
SPONGE T-LAP 4X18 ~~LOC~~+RFID (SPONGE) ×3 IMPLANT
STAPLER VISISTAT 35W (STAPLE) IMPLANT
STRIP CLOSURE SKIN 1/2X4 (GAUZE/BANDAGES/DRESSINGS) ×5 IMPLANT
SUT ETHILON 2 0 FS 18 (SUTURE) ×5 IMPLANT
SUT ETHILON 3 0 PS 1 (SUTURE) ×2 IMPLANT
SUT MNCRL AB 4-0 PS2 18 (SUTURE) ×7 IMPLANT
SUT SILK 2 0 SH (SUTURE) ×1 IMPLANT
SUT VIC AB 2-0 SH 27 (SUTURE)
SUT VIC AB 2-0 SH 27XBRD (SUTURE) ×6 IMPLANT
SUT VIC AB 3-0 54X BRD REEL (SUTURE) IMPLANT
SUT VIC AB 3-0 BRD 54 (SUTURE)
SUT VIC AB 3-0 SH 27 (SUTURE)
SUT VIC AB 3-0 SH 27X BRD (SUTURE) ×3 IMPLANT
SUT VICRYL 3-0 CR8 SH (SUTURE) ×8 IMPLANT
SYR CONTROL 10ML LL (SYRINGE) ×3 IMPLANT
TOWEL GREEN STERILE FF (TOWEL DISPOSABLE) ×8 IMPLANT
TRACER MAGTRACE VIAL (MISCELLANEOUS) ×1 IMPLANT
TUBE CONNECTING 20X1/4 (TUBING) ×4 IMPLANT
YANKAUER SUCT BULB TIP NO VENT (SUCTIONS) ×4 IMPLANT

## 2021-04-20 NOTE — Anesthesia Procedure Notes (Signed)
Anesthesia Regional Block: Pectoralis block  ? ?Pre-Anesthetic Checklist: , timeout performed,  Correct Patient, Correct Site, Correct Laterality,  Correct Procedure, Correct Position, site marked,  Risks and benefits discussed,  Surgical consent,  Pre-op evaluation,  At surgeon's request and post-op pain management ? ?Laterality: Right ? ?Prep: chloraprep     ?  ?Needles:  ?Injection technique: Single-shot ? ?  ? ? ?Needle Length: 9cm  ?Needle Gauge: 22  ? ? ? ?Additional Needles: ?Arrow? StimuQuik? ECHO Echogenic Stimulating PNB Needle ? ?Procedures:,,,, ultrasound used (permanent image in chart),,    ?Narrative:  ?Start time: 04/20/2021 7:27 AM ?End time: 04/20/2021 7:32 AM ?Injection made incrementally with aspirations every 5 mL. ? ?Performed by: Personally  ?Anesthesiologist: Oleta Mouse, MD ? ? ? ?

## 2021-04-20 NOTE — Interval H&P Note (Signed)
History and Physical Interval Note: ? ?04/20/2021 ?7:21 AM ? ?Allison Dodson  has presented today for surgery, with the diagnosis of BREAST CANCER.  The various methods of treatment have been discussed with the patient and family. After consideration of risks, benefits and other options for treatment, the patient has consented to  Procedure(s): ?LEFT MASTECTOMY WITH LEFT AXILLARY SENTINEL LYMPH NODE BIOPSY (Left) ?RIGHT TOTAL MASTECTOMY (Right) ?REMOVAL PORT-A-CATH (N/A) as a surgical intervention.  The patient's history has been reviewed, patient examined, no change in status, stable for surgery.  I have reviewed the patient's chart and labs.  Questions were answered to the patient's satisfaction.   ? ? ?Rolm Bookbinder ? ? ?

## 2021-04-20 NOTE — H&P (Signed)
?72 yof who noted a mass in august. She has c density breasts. US shows a 3 cm mass at 3 oclock in left breast. Ax [place] is negative. Right breast is negative. There is also a poss 5 mm satellite nodule as well. 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 Right breast was normal. Her lymph nodes were all normal. Her genetic testing is all negative. She is undergone neoadjuvant chemotherapy with dose dense Adriamycin and Cytoxan followed by Taxol which she is on now. She had a repeat MRI recently which shows significant improvement in the size of the mass. The right side remains negative. Lymph nodes remain negative. The left breast mass now is 1.3 cm. She is here to discuss surgery. ? ? ?Review of Systems: ?A complete review of systems was obtained from the patient. I have reviewed this information and discussed as appropriate with the patient. See HPI as well for other ROS. ? ?Review of Systems  ?All other systems reviewed and are negative. ? ? ?Medical History: ?Past Medical History:  ?Diagnosis Date  ? History of cancer  ? ?Patient Active Problem List  ?Diagnosis  ? Malignant neoplasm of upper-outer quadrant of left breast in female, estrogen receptor positive (CMS-HCC)  ? ?History reviewed. No pertinent surgical history.  ? ?No Known Allergies ? ?Current Outpatient Medications on File Prior to Visit  ?Medication Sig Dispense Refill  ? ALPRAZolam (XANAX) 0.25 MG tablet Take 0.25 mg by mouth at bedtime as needed for Sleep ? ?History reviewed. No pertinent family history.  ? ?Social History  ? ?Tobacco Use  ?Smoking Status Never  ?Smokeless Tobacco Never  ? ? ?Social History  ? ?Socioeconomic History  ? Marital status: Married  ?Tobacco Use  ? Smoking status: Never  ? Smokeless tobacco: Never  ?Substance and Sexual Activity  ? Alcohol use: Not  Currently  ? Drug use: Not Currently  ? ?Objective:  ? ?Physical Exam ?Constitutional:  ?Appearance: Normal appearance.  ?Chest:  ?Breasts: ?Right: No inverted nipple, mass or nipple discharge.  ?Left: No inverted nipple, mass or nipple discharge.  ?Comments: Right sided port in place ?Lymphadenopathy:  ?Upper Body:  ?Right upper body: No supraclavicular or axillary adenopathy.  ?Left upper body: No supraclavicular or axillary adenopathy.  ?Neurological:  ?Mental Status: She is alert.  ? ? ?Assessment and Plan:  ? ?Clinical stage II left breast cancer status post primary chemotherapy. ? ?Left mastectomy, left axillary sentinel lymph node biopsy, right mastectomy, port removal ? ?She desires left mastectomy with flat closure on cancer side. Offered her plastics visit and discussed that process. She declined. ?We discussed flat closure and still possibility of some ripples given habitus. Discussed drains, removal of nac, skin flap necrosis etc.  ?She also desire right rrm which given her age I think is not unreasonable for cancer reduction as well as symmetry. ?We discussed the staging and pathophysiology of breast cancer. We discussed all of the different options for treatment for breast cancer including surgery, chemotherapy, radiation therapy, Herceptin, and antiestrogen therapy. ?We discussed a sentinel lymph node biopsy as she does not appear to having lymph node involvement right now. We discussed the performance of that with injection of radioactive tracer. We discussed that there is a chance of having a positive node  with a sentinel lymph node biopsy and we will await the permanent pathology to make any other first further decisions in terms of her treatment. We discussed up to a 5% risk lifetime of chronic shoulder pain as well as lymphedema associated with a sentinel lymph node biopsy. ?We also discussed port removal and this is ok with oncology.  ?We discussed the risks of operation including bleeding,  infection, possible reoperation.  ?

## 2021-04-20 NOTE — Anesthesia Procedure Notes (Signed)
Anesthesia Regional Block: Pectoralis block  ? ?Pre-Anesthetic Checklist: , timeout performed,  Correct Patient, Correct Site, Correct Laterality,  Correct Procedure, Correct Position, site marked,  Risks and benefits discussed,  Surgical consent,  Pre-op evaluation,  At surgeon's request and post-op pain management ? ?Laterality: Left ? ?Prep: chloraprep     ?  ?Needles:  ?Injection technique: Single-shot ? ?  ? ? ?Needle Length: 9cm  ?Needle Gauge: 22  ? ? ? ?Additional Needles: ?Arrow? StimuQuik? ECHO Echogenic Stimulating PNB Needle ? ?Procedures:,,,, ultrasound used (permanent image in chart),,    ?Narrative:  ?Start time: 04/20/2021 7:21 AM ?End time: 04/20/2021 7:26 AM ?Injection made incrementally with aspirations every 5 mL. ? ?Performed by: Personally  ?Anesthesiologist: Oleta Mouse, MD ? ? ? ? ?

## 2021-04-20 NOTE — Anesthesia Postprocedure Evaluation (Signed)
Anesthesia Post Note ? ?Patient: Allison Dodson ? ?Procedure(s) Performed: LEFT MASTECTOMY WITH LEFT AXILLARY SENTINEL LYMPH NODE BIOPSY (Left: Breast) ?RIGHT TOTAL MASTECTOMY (Right: Breast) ?REMOVAL PORT-A-CATH (Right: Chest) ? ?  ? ?Patient location during evaluation: PACU ?Anesthesia Type: Regional and General ?Level of consciousness: awake and alert ?Pain management: pain level controlled ?Vital Signs Assessment: post-procedure vital signs reviewed and stable ?Respiratory status: spontaneous breathing, nonlabored ventilation, respiratory function stable and patient connected to nasal cannula oxygen ?Cardiovascular status: blood pressure returned to baseline and stable ?Postop Assessment: no apparent nausea or vomiting ?Anesthetic complications: no ? ? ?No notable events documented. ? ?Last Vitals:  ?Vitals:  ? 04/20/21 1130 04/20/21 1255  ?BP: 118/76   ?Pulse: (!) 101 (!) 115  ?Resp: 18 20  ?Temp: 36.7 ?C   ?SpO2: 97% 99%  ?  ?Last Pain:  ?Vitals:  ? 04/20/21 1130  ?TempSrc:   ?PainSc: 3   ? ? ?  ?  ?  ?  ?  ?  ? ?Hether Anselmo L Jahkeem Kurka ? ? ? ? ?

## 2021-04-20 NOTE — Anesthesia Procedure Notes (Signed)
Procedure Name: Intubation ?Date/Time: 04/20/2021 7:49 AM ?Performed by: Glory Buff, CRNA ?Pre-anesthesia Checklist: Patient identified, Emergency Drugs available, Suction available and Patient being monitored ?Patient Re-evaluated:Patient Re-evaluated prior to induction ?Oxygen Delivery Method: Circle system utilized ?Preoxygenation: Pre-oxygenation with 100% oxygen ?Induction Type: IV induction ?Ventilation: Mask ventilation without difficulty ?Laryngoscope Size: Sabra Heck and 3 ?Tube type: Oral ?Tube size: 7.0 mm ?Number of attempts: 1 ?Airway Equipment and Method: Stylet and Oral airway ?Placement Confirmation: ETT inserted through vocal cords under direct vision, positive ETCO2 and breath sounds checked- equal and bilateral ?Secured at: 20 cm ?Tube secured with: Tape ?Dental Injury: Teeth and Oropharynx as per pre-operative assessment  ? ? ? ? ?

## 2021-04-20 NOTE — Transfer of Care (Signed)
Immediate Anesthesia Transfer of Care Note ? ?Patient: Allison Dodson ? ?Procedure(s) Performed: LEFT MASTECTOMY WITH LEFT AXILLARY SENTINEL LYMPH NODE BIOPSY (Left: Breast) ?RIGHT TOTAL MASTECTOMY (Right: Breast) ?REMOVAL PORT-A-CATH (Right: Chest) ? ?Patient Location: PACU ? ?Anesthesia Type:General ? ?Level of Consciousness: drowsy, patient cooperative and responds to stimulation ? ?Airway & Oxygen Therapy: Patient Spontanous Breathing and Patient connected to face mask oxygen ? ?Post-op Assessment: Report given to RN and Post -op Vital signs reviewed and stable ? ?Post vital signs: Reviewed and stable ? ?Last Vitals:  ?Vitals Value Taken Time  ?BP 107/57 04/20/21 1033  ?Temp    ?Pulse 110 04/20/21 1034  ?Resp 19 04/20/21 1034  ?SpO2 100 % 04/20/21 1034  ?Vitals shown include unvalidated device data. ? ?Last Pain:  ?Vitals:  ? 04/20/21 0652  ?TempSrc: Oral  ?PainSc: 0-No pain  ?   ? ?  ? ?Complications: No notable events documented. ?

## 2021-04-20 NOTE — Anesthesia Preprocedure Evaluation (Addendum)
Anesthesia Evaluation  ?Patient identified by MRN, date of birth, ID band ?Patient awake ? ? ? ?Reviewed: ?Allergy & Precautions, NPO status , Patient's Chart, lab work & pertinent test results ? ?History of Anesthesia Complications ?Negative for: history of anesthetic complications ? ?Airway ?Mallampati: III ? ?TM Distance: >3 FB ?Neck ROM: Full ? ? ? Dental ? ?(+) Teeth Intact, Dental Advisory Given,  ?  ?Pulmonary ?neg pulmonary ROS, neg shortness of breath, neg sleep apnea, neg COPD, neg recent URI,  ?  ?breath sounds clear to auscultation ? ? ? ? ? ? Cardiovascular ?negative cardio ROS ? ? ?Rhythm:Regular  ??1. Left ventricular ejection fraction, by estimation, is 60 to 65%. The  ?left ventricle has normal function. The left ventricle has no regional  ?wall motion abnormalities. Left ventricular diastolic parameters were  ?normal. The average left ventricular  ?global longitudinal strain is -19.2 %. The global longitudinal strain is  ?normal.  ??2. Right ventricular systolic function is normal. The right ventricular  ?size is normal.  ??3. The mitral valve is normal in structure. No evidence of mitral valve  ?regurgitation. No evidence of mitral stenosis.  ??4. The aortic valve has an indeterminant number of cusps. Aortic valve  ?regurgitation is not visualized. No aortic stenosis is present.  ??5. The inferior vena cava is normal in size with greater than 50%  ?respiratory variability, suggesting right atrial pressure of 3 mmHg.  ?  ?Neuro/Psych ?negative neurological ROS ? negative psych ROS  ? GI/Hepatic ?negative GI ROS, Neg liver ROS,   ?Endo/Other  ?Morbid obesity ? Renal/GU ?negative Renal ROS  ? ?  ?Musculoskeletal ? ? Abdominal ?  ?Peds ? Hematology ?negative hematology ROS ?(+)   ?Anesthesia Other Findings ? ? Reproductive/Obstetrics ?Lab Results ?     Component                Value               Date                 ?     PREGTESTUR               NEGATIVE             04/20/2021           ? ? ?  ? ? ? ? ? ? ? ? ? ? ? ? ? ?  ?  ? ? ? ? ? ? ? ?Anesthesia Physical ?Anesthesia Plan ? ?ASA: 3 ? ?Anesthesia Plan: General and Regional  ? ?Post-op Pain Management: Regional block* and Ofirmev IV (intra-op)*  ? ?Induction: Intravenous ? ?PONV Risk Score and Plan: 3 and Ondansetron, Dexamethasone, Propofol infusion, TIVA and Midazolam ? ?Airway Management Planned: Oral ETT ? ?Additional Equipment: None ? ?Intra-op Plan:  ? ?Post-operative Plan:  ? ?Informed Consent: I have reviewed the patients History and Physical, chart, labs and discussed the procedure including the risks, benefits and alternatives for the proposed anesthesia with the patient or authorized representative who has indicated his/her understanding and acceptance.  ? ? ? ?Dental advisory given ? ?Plan Discussed with: CRNA ? ?Anesthesia Plan Comments:   ? ? ? ? ? ? ?Anesthesia Quick Evaluation ? ?

## 2021-04-20 NOTE — Op Note (Signed)
Preoperative diagnosis: Clinical stage II left breast cancer status post primary chemotherapy ?Postoperative diagnosis: Same as above ?Procedure: ?1.  Right risk reducing mastectomy ?2.  Injection of mag trace on left side for sentinel lymph node identification ?3.  Left mastectomy ?4.  Left deep axillary sentinel lymph node biopsy ?5.  Port removal ?Surgeon: Dr. Serita Grammes ?Anesthesia: General with bilateral pectoral blocks ?Estimated blood loss: 75 cc ?Specimens: ?1.  Right breast tissue marked short stitch superior, long stitch lateral ?2.  Left breast tissue marked short stitch superior, long stitch lateral ?3.  Left deep axillary sentinel nodes with count of 5800 ?Complications: None ?Drains1 19 [place] Blake drain on either side ?Sponge needle count was correct at completion ?Decision to recovery stable condition ? ?Indications: 25 yof who noted a mass in august. She has c density breasts. US shows a 3 cm mass at 3 oclock in left breast. Ax [place] is negative. Right breast is negative. There is also a poss 5 mm satellite nodule as well. 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 Right breast was normal. Her lymph nodes were all normal. Her genetic testing is all negative. She has undergone neoadjuvant chemotherapy with dose dense Adriamycin and Cytoxan followed by Taxol which she is on now. She had a repeat MRI recently which shows significant improvement in the size of the mass. The right side remains negative. Lymph nodes remain negative. The left breast mass now is 1.3 cm.  She elected to proceed with bilateral mastectomies without reconstruction and a sentinel node biopsy and oncology is agreed to a port removal. ? ?Procedure: After informed consent was obtained the patient first underwent bilateral pectoral blocks.  She was given  antibiotics.  SCDs were in place.  She was placed under general anesthesia without complication.  She was prepped and draped in the standard sterile surgical fashion.  A surgical timeout was then performed. ? ?I prepped the area on the left.  I then injected 2 cc of mag trace in the subareolar position. ? ?I first did the right mastectomy.  I made a Wise pattern incision.  I then created flaps to the clavicle, parasternal area, below the inframammary fold and to the lateral edge of the breast.  The breast and the fascia were then removed.  I then obtained hemostasis.  I remove the port from this incision as well.  I sutured the hole shot with 3-0 Vicryl.  I then placed a 62 [place] Blake drain and secured this with a 3-0 nylon suture.  I then closed with 3-0 Vicryl's for the deep dermal sutures.  I did end up removing some additional tissue laterally to try to get this to lie completely flat.  The skin was then closed with 4-0 Monocryl.  I placed a 3-0 nylon at the T-junction.  Glue and Steri-Strips were applied. ? ?I then did the left mastectomy.  I made another Wise pattern incision.  I created flaps in a similar fashion and remove the breast of the fascia from the side as well.  I obtained hemostasis.  I then used the probe to identify what appeared to be a couple of sentinel nodes.  Some of these were brown stained.  The highest count is listed as above.  These were  removed and passed off the table.  There is no background activity.  I then placed a 19 [place] Blake drain.  Hemostasis was observed.  I closed the dermis with 3-0 Vicryl's again.  I did remove some additional tissue laterally and took this to the bed to try to avoid any ripples.  The skin was cleaned closed with 4 Monocryl.  Glue and Steri-Strips were applied.  She tolerated this well was extubated and transferred to recovery stable. ? ? ?

## 2021-04-20 NOTE — Progress Notes (Signed)
Assisted Dr. Moser with left, right, pectoralis, ultrasound guided block. Side rails up, monitors on throughout procedure. See vital signs in flow sheet. Tolerated Procedure well. 

## 2021-04-20 NOTE — Discharge Instructions (Signed)
[facility] ?Office Phone Number [phone] ? ?POST OP INSTRUCTIONS ?Take 400 mg of ibuprofen every 8 hours or 650 mg tylenol every 6 hours for next 72 hours then as needed. Use ice several times daily also. ? ?A prescription for pain medication may be given to you upon discharge.  Take your pain medication as prescribed, if needed.  If narcotic pain medicine is not needed, then you may take acetaminophen (Tylenol), naprosyn (Alleve) or ibuprofen (Advil) as needed. ?Take your usually prescribed medications unless otherwise directed ?If you need a refill on your pain medication, please contact your pharmacy.  They will contact our office to request authorization.  Prescriptions will not be filled after 5pm or on week-ends. ?You should eat very light the first 24 hours after surgery, such as soup, crackers, pudding, etc.  Resume your normal diet the day after surgery. ?Most patients will experience some swelling and bruising in the breast.  Ice packs and a good support bra will help.  Wear the breast binder provided or a sports bra for 72 hours day and night.  After that wear a sports bra or the bra you brought during the day until you return to the office. Swelling and bruising can take several days to resolve.  You may shower 48 hours after surgery ?It is common to experience some constipation if taking pain medication after surgery.  Increasing fluid intake and taking a stool softener will usually help or prevent this problem from occurring.  A mild laxative (Milk of Magnesia or Miralax) should be taken according to package directions if there are no bowel movements after 48 hours. ?I used skin glue on the incision, you may shower in 24 hours.  The glue will flake off over the next 2-3 weeks.  Any sutures or staples will be removed at the office during your follow-up visit.  ?ACTIVITIES:  You may resume regular daily activities (gradually increasing) beginning the next day.  Wearing a good support  bra or sports bra minimizes pain and swelling.  Do not lift your left arm over shoulder for first week. No lifting over 15 pounds with left arm until return to office. You may have sexual intercourse when it is comfortable. ?You may drive when you no longer are taking prescription pain medication, you can comfortably wear a seatbelt, and you can safely maneuver your car and apply brakes. ?You should see your doctor in the office for a follow-up appointment approximately two weeks after your surgery.  Your doctor?s nurse will typically make your follow-up appointment when she calls you with your pathology report.  Expect your pathology report 3-4 business days after your surgery.  You may call to check if you do not hear from [place] after three days. ?OTHER INSTRUCTIONS: _______________________________________________________________________________________________ _____________________________________________________________________________________________________________________________________ ?_____________________________________________________________________________________________________________________________________ ?_____________________________________________________________________________________________________________________________________ ? ?WHEN TO CALL DR Peaches Vanoverbeke: ?Fever over 101.0 ?Nausea and/or vomiting. ?Extreme swelling or bruising. ?Continued bleeding from incision. ?Increased pain, redness, or drainage from the incision. ? ?The clinic staff is available to answer your questions during regular business hours.  Please don?t hesitate to call and ask to speak to one of the nurses for clinical concerns.  If you have a medical emergency, go to the nearest emergency room or call 911.  A surgeon from [facility] is always on call at the hospital. ? ?For further questions, please visit [email or web address] ? ?

## 2021-04-20 NOTE — Progress Notes (Signed)
Patient ID: Allison Dodson, female   DOB: [birth year], 26 y.o.   MRN: [id number] ?Saw patient for left sided swelling after mastectomy this am.  Vitals normal (heart rate is what is was prior to surgery), drain has put out 120 and is thin serosang.  She has mild hematoma that per nursing is actually getting smaller not expanding. I think drain is catching up. I dont think needs a return to the OR now and will plan to follow her.  Discussed with family. ?

## 2021-04-21 ENCOUNTER — Encounter (HOSPITAL_BASED_OUTPATIENT_CLINIC_OR_DEPARTMENT_OTHER): Payer: Self-pay | Admitting: General Surgery

## 2021-04-21 DIAGNOSIS — C50412 Malignant neoplasm of upper-outer quadrant of left female breast: Secondary | ICD-10-CM | POA: Diagnosis not present

## 2021-04-21 MED ORDER — OXYCODONE HCL 5 MG PO TABS: 5.0000 mg | ORAL_TABLET | ORAL | 0 refills | Status: DC | PRN

## 2021-04-21 MED ORDER — METHOCARBAMOL 500 MG PO TABS: 500.0000 mg | ORAL_TABLET | Freq: Four times a day (QID) | ORAL | 0 refills | Status: DC

## 2021-04-21 NOTE — Discharge Summary (Signed)
Physician Discharge Summary  ?Patient ID: ?Allison Dodson ?MRN: [id number] ?DOB/AGE: Aug 01, 1995 26 y.o. ? ?Admit date: 04/20/2021 ?Discharge date: 04/21/2021 ? ?Admission Diagnoses: ?Left breast cancer ? ?Discharge Diagnoses:  ?Principal Problem: ?  Breast cancer, left breast ([place]) ? ? ?Discharged Condition: good ? ?Hospital Course: 50 yof s/p bilateral mastectomies and left ax sn biopsy. Doing well this am with no hematoma ? ?Consults: None ? ?Significant Diagnostic Studies: none ? ?Treatments: surgery: bilateral mastectomies with left ax sn biopsy ? ?Discharge Exam: ?Blood pressure 119/75, pulse 88, temperature 98 ?F (36.7 ?C), resp. rate 16, height '5\' 3"'$  (1.6 m), weight 105.7 kg, last menstrual period 12/24/2020, SpO2 100 %. ?No hematomas, drain as expected ? ?Disposition:  ? ? ?Allergies as of 04/21/2021   ? ?   Reactions  ? Paclitaxel Shortness Of Breath, Palpitations, Other (See Comments)  ? flushing  ? ?  ? ?  ?Medication List  ?  ? ?TAKE these medications   ? ?ALPRAZolam 0.25 MG tablet ?Commonly known as: Duanne Moron ?Take by mouth. ?  ?lidocaine-prilocaine cream ?Commonly known as: EMLA ?Apply to affected area once ?  ?methocarbamol 500 MG tablet ?Commonly known as: Robaxin ?Take 1 tablet (500 mg total) by mouth 4 (four) times daily. ?  ?oxyCODONE 5 MG immediate release tablet ?Commonly known as: Oxy IR/ROXICODONE ?Take 1 tablet (5 mg total) by mouth every 6 (six) hours as needed. ?  ?oxyCODONE 5 MG immediate release tablet ?Commonly known as: Oxy IR/ROXICODONE ?Take 1 tablet (5 mg total) by mouth every 4 (four) hours as needed for moderate pain. ?  ? ?  ? ? Follow-up Information   ? ? Rolm Bookbinder, MD Follow up in 3 week(s).   ?Specialty: General Surgery ?Contact information: ?[address] ?[address] ?[place] [address] ?[phone] ? ? ?  ?  ? ?  ?  ? ?  ? ? ?Signed: ?Rolm Bookbinder ?04/21/2021, 8:28 AM ? ? ?

## 2021-04-21 NOTE — Progress Notes (Signed)
? ?Patient Care Team: ?Kerry Dory, NP as PCP - General (Nurse Practitioner) ?Rockwell Germany, RN as Oncology Nurse Navigator ?Mauro Kaufmann, RN as Oncology Nurse Navigator ?Nicholas Lose, MD as Consulting Physician (Hematology and Oncology) ?Rolm Bookbinder, MD as Consulting Physician (General Surgery) ? ?DIAGNOSIS:  ?Encounter Diagnosis  ?Name Primary?  ? Malignant neoplasm of upper-outer quadrant of left breast in female, estrogen receptor positive ([place])   ? ? ?SUMMARY OF ONCOLOGIC HISTORY: ?Oncology History  ?Malignant neoplasm of upper-outer quadrant of left breast in female, estrogen receptor positive ([place])  ?09/25/2020 Initial Diagnosis  ? Palpable lump in the left breast.  Mammogram revealed 3 cm tumor and a possible 5 mm satellite nodule.  Biopsy revealed grade 3 IDC ER 40% weak, PR 0%, HER2 negative, Ki-67 60% ?  ?09/25/2020 Cancer Staging  ? Staging form: Breast, AJCC 8th Edition ?- Clinical stage from 09/25/2020: Stage IIB (cT2, cN0, cM0, G3, ER+, PR-, HER2-) - Signed by Nicholas Lose, MD on 09/25/2020 ?Stage prefix: Initial diagnosis ?Histologic grading system: 3 grade system ? ?  ?10/02/2020 Genetic Testing  ? Negative hereditary cancer genetic testing: no pathogenic variants detected in [facility] BRCAPlus Panel and [facility] CancerNext-Expanded +RNAinsight Panel.  The report dates are October 02, 2020 and October 13, 2020, respectively.  ? ?The BRCAplus panel offered by [employer] and includes sequencing and deletion/duplication analysis for the following 8 genes: ATM, BRCA1, BRCA2, CDH1, CHEK2, PALB2, PTEN, and TP53.  The CancerNext-Expanded gene panel offered by [facility] and includes sequencing, rearrangement, and RNA analysis for the following 77 genes: AIP, ALK, APC, ATM, AXIN2, BAP1, BARD1, BLM, BMPR1A, BRCA1, BRCA2, BRIP1, CDC73, CDH1, CDK4, CDKN1B, CDKN2A, CHEK2, CTNNA1, DICER1, FANCC, FH, FLCN, GALNT12, KIF1B, LZTR1, MAX, MEN1, MET, MLH1, MSH2, MSH3, MSH6, MUTYH, NBN, NF1, NF2,  NTHL1, PALB2, PHOX2B, PMS2, POT1, PRKAR1A, PTCH1, PTEN, RAD51C, RAD51D, RB1, RECQL, RET, SDHA, SDHAF2, SDHB, SDHC, SDHD, SMAD4, SMARCA4, SMARCB1, SMARCE1, STK11, SUFU, TMEM127, TP53, TSC1, TSC2, VHL and XRCC2 (sequencing and deletion/duplication); EGFR, EGLN1, HOXB13, KIT, MITF, PDGFRA, POLD1, and POLE (sequencing only); EPCAM and GREM1 (deletion/duplication only).  ?  ?11/03/2020 -  Chemotherapy  ? Patient is on Treatment Plan : BREAST ADJUVANT DOSE DENSE AC q14d / PACLitaxel switched to Abraxane (due to rxn) q7d  ?  ?  ?04/20/2021 Surgery  ? Right mastectomy: Negative for cancer ?Left mastectomy: High-grade 1.4 cm IDC with clear margins, 0/7 lymph nodes negative ?Biomarkers on the initial biopsy: ER 40% weak, PR 0%, HER2 1+ negative, Ki-67 60% ? ?  ? ? ?CHIEF COMPLIANT: Discuss pathology report ?  ? ?INTERVAL HISTORY: Allison Dodson is a 26 y.o. She presents to the clinic today to discuss pathology report. Denies pain after surgery. She states everything went well just ready to get the drains remove. She complains of a little nerve pain. And complains of arms feels like sun burn. ? ? ?ALLERGIES:  is allergic to paclitaxel. ? ?MEDICATIONS:  ?Current Outpatient Medications  ?Medication Sig Dispense Refill  ? [START ON 05/17/2021] anastrozole (ARIMIDEX) 1 MG tablet Take 1 tablet (1 mg total) by mouth daily. 90 tablet 3  ? ALPRAZolam (XANAX) 0.25 MG tablet Take by mouth.    ? ?No current facility-administered medications for this visit.  ? ? ?PHYSICAL EXAMINATION: ?ECOG PERFORMANCE STATUS: 1 - Symptomatic but completely ambulatory ? ?Vitals:  ? 05/03/21 0914  ?BP: (!) 154/86  ?Pulse: (!) 112  ?Resp: 18  ?Temp: 97.8 ?F (36.6 ?C)  ?SpO2: 100%  ? ?Filed Weights  ?  05/03/21 0914  ?Weight: 230 lb 3.2 oz (104.4 kg)  ? ?  ? ?LABORATORY DATA:  ?I have reviewed the data as listed ? ?  Latest Ref Rng & Units 03/29/2021  ?  8:33 AM 03/23/2021  ?  9:58 AM 03/16/2021  ?  9:28 AM  ?CMP  ?Glucose 70 - 99 mg/dL 114   99   134    ?BUN 6 - 20  mg/dL $Remo'11   10   9    'mAQzU$ ?Creatinine 0.44 - 1.00 mg/dL 0.50   0.44   0.51    ?Sodium 135 - 145 mmol/L 139   138   140    ?Potassium 3.5 - 5.1 mmol/L 3.9   3.8   3.7    ?Chloride 98 - 111 mmol/L 107   106   106    ?CO2 22 - 32 mmol/L $RemoveB'25   26   27    'USBjoneQ$ ?Calcium 8.9 - 10.3 mg/dL 9.2   9.6   9.4    ?Total Protein 6.5 - 8.1 g/dL 6.6   7.3   7.1    ?Total Bilirubin 0.3 - 1.2 mg/dL 0.3   0.3   0.3    ?Alkaline Phos 38 - 126 U/L 61   57   57    ?AST 15 - 41 U/L $Remo'18   21   24    'SPfUu$ ?ALT 0 - 44 U/L 34   36   41    ? ? ?Lab Results  ?Component Value Date  ? WBC 3.0 (L) 03/29/2021  ? HGB 10.7 (L) 03/29/2021  ? HCT 32.2 (L) 03/29/2021  ? MCV 87.0 03/29/2021  ? PLT 305 03/29/2021  ? NEUTROABS 1.8 03/29/2021  ? ? ?ASSESSMENT & PLAN:  ?Malignant neoplasm of upper-outer quadrant of left breast in female, estrogen receptor positive ([place]) ?09/22/2020: Palpable mass in the left breast corresponding to suspicious mass on mammogram measuring 3 cm, possible 5 mm satellite nodule, left axilla negative, right breast negative ?ER 40% weak, PR negative, HER2 negative, Ki-67 60% ?  ?Breast MRI 09/28/2020: 3.7 cm enhancing mass at 3 o'clock position left breast with a possible 7 mm satellite nodule, no abnormal lymph nodes. ?  ?Treatment plan: ?1.  Neoadjuvant chemotherapy with dose dense Adriamycin and Cytoxan followed by Taxol weekly x12 completed 03/29/2021 ?2. 04/20/2021:Right mastectomy: Negative for cancer ?Left mastectomy: High-grade res IDC 1.4 cm with clear margins, 0/7 lymph nodes negative ?Biomarkers on the initial biopsy: ER 40% weak, PR 0%, HER2 1+ negative, Ki-67 60% ?3.  Followed by adjuvant antiestrogen therapy with ovarian function suppression and aromatase inhibitor therapy ?Patient had her wedding on 10/22/2020 ?---------------------------------------------------------------------------------------------------------------------------------------- ?Pathology counseling: I discussed the final pathology report of the patient provided  a copy of  this report. I discussed the margins as well as lymph node surgeries. We also discussed the final staging along with previously performed ER/PR and HER-2/neu testing. ? ?Treatment plan: Adjuvant antiestrogen therapy (ovarian function suppression with AI) ?No role of Verzenio. ?Patient to come monthly for Zoladex injections. ?Anastrozole will be started 05/17/2021. ? ?If she wants to get pregnant I recommended that we wait 2 years. ?Return to clinic in 3 months for survivorship care plan visit ? ? ? ? ? ?No orders of the defined types were placed in this encounter. ? ?The patient has a good understanding of the overall plan. she agrees with it. she will call with any problems that may develop before the next visit here. ?Total time spent: 30 mins  including face to face time and time spent for planning, charting and co-ordination of care ? ? Harriette Ohara, MD ?05/03/21 ? ? ? I Gardiner Coins am scribing for Dr. Lindi Adie ? ?I have reviewed the above documentation for accuracy and completeness, and I agree with the above. ?  ?

## 2021-04-22 LAB — SURGICAL PATHOLOGY

## 2021-04-27 ENCOUNTER — Encounter: Payer: Self-pay | Admitting: *Deleted

## 2021-05-03 ENCOUNTER — Other Ambulatory Visit: Payer: Self-pay

## 2021-05-03 ENCOUNTER — Inpatient Hospital Stay
Payer: No Typology Code available for payment source | Attending: Hematology and Oncology | Admitting: Hematology and Oncology

## 2021-05-03 DIAGNOSIS — Z170 Estrogen receptor positive status [ER+]: Secondary | ICD-10-CM | POA: Diagnosis not present

## 2021-05-03 DIAGNOSIS — C50412 Malignant neoplasm of upper-outer quadrant of left female breast: Secondary | ICD-10-CM | POA: Insufficient documentation

## 2021-05-03 DIAGNOSIS — Z9013 Acquired absence of bilateral breasts and nipples: Secondary | ICD-10-CM | POA: Insufficient documentation

## 2021-05-03 DIAGNOSIS — Z79899 Other long term (current) drug therapy: Secondary | ICD-10-CM | POA: Insufficient documentation

## 2021-05-03 MED ORDER — ANASTROZOLE 1 MG PO TABS: 1.0000 mg | ORAL_TABLET | Freq: Every day | ORAL | 3 refills | Status: DC

## 2021-05-03 NOTE — Assessment & Plan Note (Signed)
09/22/2020: Palpable mass in the left breast corresponding to suspicious mass on mammogram measuring 3 cm, possible 5 mm satellite nodule, left axilla negative, right breast negative ?ER 40% weak, PR negative, HER2 negative, Ki-67 60% ?? ?Breast MRI 09/28/2020: 3.7 cm enhancing mass at 3 o'clock position left breast with a possible 7 mm satellite nodule, no abnormal lymph nodes. ?? ?Treatment plan: ?1.??Neoadjuvant chemotherapy with dose dense Adriamycin and Cytoxan followed by Taxol weekly x12 completed 03/29/2021 ?2.?04/20/2021:Right mastectomy: Negative for cancer ?Left mastectomy: High-grade DCIS with clear margins, no invasive cancer, 0/7 lymph nodes negative ?Biomarkers on the initial biopsy: ER 40% weak, PR 0%, HER2 1+ negative, Ki-67 60% ?3.??Followed by adjuvant antiestrogen therapy with ovarian function suppression and aromatase inhibitor therapy ?Patient had her wedding on 10/22/2020 ?---------------------------------------------------------------------------------------------------------------------------------------- ?Pathology counseling: I discussed the final pathology report of the patient provided  a copy of this report. I discussed the margins as well as lymph node surgeries. We also discussed the final staging along with previously performed ER/PR and HER-2/neu testing. ? ?Treatment plan: Adjuvant antiestrogen therapy (ovarian function suppression with AI) ?No role of Verzenio. ? ?Return to clinic in 3 months for survivorship care plan visit ? ? ? ?

## 2021-05-04 ENCOUNTER — Encounter: Payer: Self-pay | Admitting: *Deleted

## 2021-05-04 ENCOUNTER — Telehealth: Payer: Self-pay | Admitting: Hematology and Oncology

## 2021-05-04 DIAGNOSIS — Z170 Estrogen receptor positive status [ER+]: Secondary | ICD-10-CM

## 2021-05-04 NOTE — Telephone Encounter (Signed)
Scheduled appointment per 04/17 los. Left message.   ?

## 2021-05-12 ENCOUNTER — Encounter: Payer: Self-pay | Admitting: Physical Therapy

## 2021-05-12 ENCOUNTER — Ambulatory Visit: Payer: No Typology Code available for payment source | Attending: Hematology and Oncology | Admitting: Physical Therapy

## 2021-05-12 DIAGNOSIS — C50412 Malignant neoplasm of upper-outer quadrant of left female breast: Secondary | ICD-10-CM | POA: Insufficient documentation

## 2021-05-12 DIAGNOSIS — R293 Abnormal posture: Secondary | ICD-10-CM | POA: Insufficient documentation

## 2021-05-12 DIAGNOSIS — M25612 Stiffness of left shoulder, not elsewhere classified: Secondary | ICD-10-CM | POA: Diagnosis not present

## 2021-05-12 DIAGNOSIS — Z483 Aftercare following surgery for neoplasm: Secondary | ICD-10-CM | POA: Insufficient documentation

## 2021-05-12 DIAGNOSIS — M25611 Stiffness of right shoulder, not elsewhere classified: Secondary | ICD-10-CM | POA: Diagnosis not present

## 2021-05-12 DIAGNOSIS — Z170 Estrogen receptor positive status [ER+]: Secondary | ICD-10-CM | POA: Insufficient documentation

## 2021-05-12 NOTE — Therapy (Signed)
[facility] ?[facility] ?[place], [place], [address] ?Phone: [phone]   Fax:  [phone] ? ?Physical Therapy Treatment ? ?Patient Details  ?Name: Allison Dodson ?MRN: [id number] ?Date of Birth: 13-Mar-1995 ?Referring Provider (PT): Gud[place] ? ? ?Encounter Date: 05/12/2021 ? ? PT End of Session - 05/12/21 1654   ? ? Visit Number 2   ? Number of Visits 10   ? Date for PT Re-Evaluation 06/09/21   ? PT Start Time 1608   ? PT Stop Time 9179   ? PT Time Calculation (min) 43 min   ? Activity Tolerance Patient tolerated treatment well   ? Behavior During Therapy [facility] for tasks assessed/performed   ? ?  ?  ? ?  ? ? ?Past Medical History:  ?Diagnosis Date  ? Cancer [facility])   ? Family history of breast cancer 09/25/2020  ? ? ?Past Surgical History:  ?Procedure Laterality Date  ? MASTECTOMY W/ SENTINEL NODE BIOPSY Left 04/20/2021  ? Procedure: LEFT MASTECTOMY WITH LEFT AXILLARY SENTINEL LYMPH NODE BIOPSY;  Surgeon: Rolm Bookbinder, MD;  Location: [facility];  Service: General;  Laterality: Left;  ? PORT-A-CATH REMOVAL Right 04/20/2021  ? Procedure: REMOVAL PORT-A-CATH;  Surgeon: Rolm Bookbinder, MD;  Location: [facility];  Service: General;  Laterality: Right;  ? PORTACATH PLACEMENT N/A 11/02/2020  ? Procedure: INSERTION PORT-A-CATH;  Surgeon: Rolm Bookbinder, MD;  Location: [facility];  Service: General;  Laterality: N/A;  ? TOTAL MASTECTOMY Right 04/20/2021  ? Procedure: RIGHT TOTAL MASTECTOMY;  Surgeon: Rolm Bookbinder, MD;  Location: [facility];  Service: General;  Laterality: Right;  ? ? ?There were no vitals filed for this visit. ? ? Subjective Assessment - 05/12/21 1612   ? ? Subjective I have completed chemo and surgery went well. I am having pain in the left arm.   ? Pertinent History L breast cancer ER+ PR- Her2-; will complete neoadjuvant chemo then pt plans on having a double mastectomy,   ? Patient Stated Goals to gain  info from provider   ? Currently in Pain? No/denies   ? Pain Score 0-No pain   ? ?  ?  ? ?  ? ? ? ? ? [facility] PT Assessment - 05/12/21 0001   ? ?  ? Assessment  ? Medical Diagnosis L breast cancer   ? Referring Provider (PT) Lindi Adie   ? Onset Date/Surgical Date 04/20/21   ? Hand Dominance Right   ? Prior Therapy none   ?  ? Precautions  ? Precautions Other (comment)   ? Precaution Comments recent surgery/ at risk for lymphedema on the L   ?  ? Restrictions  ? Weight Bearing Restrictions No   ?  ? Balance Screen  ? Has the patient fallen in the past 6 months No   ? Has the patient had a decrease in activity level because of a fear of falling?  No   ? Is the patient reluctant to leave their home because of a fear of falling?  No   ?  ? Home Environment  ? Living Environment Private residence   ? Living Arrangements Spouse/significant other   ? Available Help at Discharge Family   ? Type of Home House   ?  ? Prior Function  ? Level of Independence Independent   ? Vocation On disability   long term disability  ? Vocation Requirements works from home for [employer]   ?  Leisure pt is not currently exercising   ?  ? Cognition  ? Overall Cognitive Status Within Functional Limits for tasks assessed   ?  ? Observation/Other Assessments  ? Observations healing bilateral mastectomy scars with steri strips in place, on small raw area under L axilla where compression bra has rubbed   ?  ? Posture/Postural Control  ? Posture/Postural Control Postural limitations   ? Postural Limitations Rounded Shoulders;Forward head   ?  ? AROM  ? Right Shoulder Extension 57 Degrees   ? Right Shoulder Flexion 130 Degrees   ? Right Shoulder ABduction 100 Degrees   ? Right Shoulder Internal Rotation 64 Degrees   ? Right Shoulder External Rotation 66 Degrees   ? Left Shoulder Extension 55 Degrees   ? Left Shoulder Flexion 105 Degrees   ? Left Shoulder ABduction 73 Degrees   ? Left Shoulder Internal Rotation 70 Degrees   ? Left Shoulder External  Rotation 55 Degrees   ? ?  ?  ? ?  ? ? ? ? LYMPHEDEMA/ONCOLOGY QUESTIONNAIRE - 05/12/21 0001   ? ?  ? Type  ? Cancer Type left breast cancer   ?  ? Surgeries  ? Mastectomy Date 04/20/21   ? Sentinel Lymph Node Biopsy Date 04/20/21   ? Number Lymph Nodes Removed 7   ?  ? Treatment  ? Active Chemotherapy Treatment No   ? Past Chemotherapy Treatment Yes   ? Active Radiation Treatment No   ? Past Radiation Treatment No   ? Current Hormone Treatment Yes   ? Date 05/17/21   ? Drug Name Anastrozole   ? Past Hormone Therapy No   ?  ? What other symptoms do you have  ? Are you Having Heaviness or Tightness Yes   ? Are you having Pain Yes   ? Is it Hard or Difficult finding clothes that fit No   ? Do you have infections No   ? Is there Decreased scar mobility Yes   still healing  ? ?  ?  ? ?  ? ? ? ? ? ? ? ? ? ? ? ? ? [facility] Adult PT Treatment/Exercise - 05/12/21 0001   ? ?  ? Manual Therapy  ? Manual Therapy Passive ROM   ? Manual therapy comments cut 1/4 in grey foam and placed in TG soft for pt to wear in bilateral axilla to keep her compression bra from rubbing this area   ? Passive ROM in supine to R shoulder in to flexion, abduction and ER to pt's tolerance   ? ?  ?  ? ?  ? ? ? ? ? ? ? ? ? ? PT Education - 05/12/21 1700   ? ? Education Details ABC class, post op shoulder ROM exercises   ? Person(s) Educated Patient   ? Methods Explanation   ? Comprehension Verbalized understanding   ? ?  ?  ? ?  ? ? ? ? ? ? PT Long Term Goals - 05/12/21 1701   ? ?  ? PT LONG TERM GOAL #1  ? Title Pt will not demonstrate any signs or symptoms of lymphedema and will return to baseline shoulder ROM measurements   ? Period Months   ? Status On-going   ? Target Date 06/09/21   ?  ? PT LONG TERM GOAL #2  ? Title Pt will demonstrate 160 degrees of bilateral shoulder flexion to allow her to reach overhead.   ? Baseline R 130  L 105   ? Time 4   ? Period Weeks   ? Status New   ? Target Date 06/09/21   ?  ? PT LONG TERM GOAL #3  ? Title Pt will  demonstrate 160 degrees of bilateral shoulder abduction to allow her to reach out to the sides.   ? Baseline R 100 L 73   ? Time 4   ? Period Weeks   ? Status New   ? Target Date 06/09/21   ?  ? PT LONG TERM GOAL #4  ? Title Pt will demonstrate 80 degrees of bilateral shoulder external rotation to allow her to return to PLOF.   ? Baseline R 66 L 55   ? Time 4   ? Period Weeks   ? Status New   ? Target Date 06/09/21   ?  ? PT LONG TERM GOAL #5  ? Title Pt will be independent in a home exercise program for continued strengthening and stretching.   ? Time 4   ? Period Weeks   ? Status New   ? Target Date 06/09/21   ? ?  ?  ? ?  ? ? ? ? ? ? ? ? Plan - 05/12/21 1655   ? ? Clinical Impression Statement Pt returns to PT after completing neoadjuvant chemo and a bilateral mastectomy and L SLNB (0/7) on 04/20/21 for treatment of L breast cancer. She has decreased bilateral shoulder ROM with L worse than R. She had her drains removed just over a week ago. She has not begun post op exercises yet and was instructed in these today. Her compression bra had rubbed a raw place under her L axilla so added thin grey foam in TG soft to this area to help prevent rubbing. Instructed pt in post op breast exercises and signed her up for the ABC class. Began gentle PROM to L shoulder today with good improvements noted in ROM by end of session. Pt would benefit from skilled PT services to improve bilateral shoulder ROM, decrease pain and discomfort across bilateral pecs and instruct pt in a home exercise program for continued strengthening and stretching.   ? PT Frequency 2x / week   ? PT Duration 4 weeks   ? PT Treatment/Interventions ADLs/Self Care Home Management;Therapeutic exercise;Patient/family education;Manual lymph drainage;Scar mobilization;Orthotic Fit/Training;Passive range of motion;Manual techniques;Taping   ? PT Next Visit Plan PROM to bilateral shoulders, pulleys, ball, instruct in supine dowel exercises when ready   ? PT Home  Exercise Plan post op breast exercises   ? Consulted and Agree with Plan of Care Patient;Other (Comment)   spouse  ? ?  ?  ? ?  ? ? ?Patient will benefit from skilled therapeutic intervention in order to i

## 2021-05-17 ENCOUNTER — Other Ambulatory Visit: Payer: Self-pay

## 2021-05-17 ENCOUNTER — Inpatient Hospital Stay: Payer: No Typology Code available for payment source | Attending: Hematology and Oncology

## 2021-05-17 VITALS — BP 134/80 | HR 97 | Temp 98.3°F | Resp 18

## 2021-05-17 DIAGNOSIS — C50412 Malignant neoplasm of upper-outer quadrant of left female breast: Secondary | ICD-10-CM | POA: Insufficient documentation

## 2021-05-17 DIAGNOSIS — Z5111 Encounter for antineoplastic chemotherapy: Secondary | ICD-10-CM | POA: Insufficient documentation

## 2021-05-17 DIAGNOSIS — Z170 Estrogen receptor positive status [ER+]: Secondary | ICD-10-CM

## 2021-05-17 MED ORDER — GOSERELIN ACETATE 3.6 MG ~~LOC~~ IMPL
3.6000 mg | DRUG_IMPLANT | Freq: Once | SUBCUTANEOUS | Status: AC
  Administered 2021-05-17: 3.6 mg via SUBCUTANEOUS
  Filled 2021-05-17: qty 3.6

## 2021-05-18 ENCOUNTER — Ambulatory Visit: Payer: No Typology Code available for payment source | Attending: Hematology and Oncology | Admitting: Physical Therapy

## 2021-05-18 ENCOUNTER — Encounter: Payer: Self-pay | Admitting: Physical Therapy

## 2021-05-18 DIAGNOSIS — M25612 Stiffness of left shoulder, not elsewhere classified: Secondary | ICD-10-CM | POA: Diagnosis present

## 2021-05-18 DIAGNOSIS — R293 Abnormal posture: Secondary | ICD-10-CM | POA: Diagnosis present

## 2021-05-18 DIAGNOSIS — Z483 Aftercare following surgery for neoplasm: Secondary | ICD-10-CM | POA: Diagnosis present

## 2021-05-18 DIAGNOSIS — Z170 Estrogen receptor positive status [ER+]: Secondary | ICD-10-CM | POA: Diagnosis present

## 2021-05-18 DIAGNOSIS — M25611 Stiffness of right shoulder, not elsewhere classified: Secondary | ICD-10-CM | POA: Diagnosis not present

## 2021-05-18 DIAGNOSIS — C50412 Malignant neoplasm of upper-outer quadrant of left female breast: Secondary | ICD-10-CM | POA: Diagnosis present

## 2021-05-18 NOTE — Therapy (Signed)
[place] [place] ?[facility] ?[place], [place], [address] ?Phone: [phone]   Fax:  [phone] ? ?Physical Therapy Treatment ? ?Patient Details  ?Name: Allison Dodson ?MRN: [id number] ?Date of Birth: 1995-06-07 ?Referring Provider (PT): Gudena ? ? ?Encounter Date: 05/18/2021 ? ? PT End of Session - 05/18/21 1304   ? ? Visit Number 3   ? Number of Visits 10   ? Date for PT Re-Evaluation 06/09/21   ? PT Start Time 1303   ? PT Stop Time 2297   ? PT Time Calculation (min) 51 min   ? Activity Tolerance Patient tolerated treatment well   ? Behavior During Therapy [facility] for tasks assessed/performed   ? ?  ?  ? ?  ? ? ?Past Medical History:  ?Diagnosis Date  ? Cancer [facility])   ? Family history of breast cancer 09/25/2020  ? ? ?Past Surgical History:  ?Procedure Laterality Date  ? MASTECTOMY W/ SENTINEL NODE BIOPSY Left 04/20/2021  ? Procedure: LEFT MASTECTOMY WITH LEFT AXILLARY SENTINEL LYMPH NODE BIOPSY;  Surgeon: Rolm Bookbinder, MD;  Location: [place];  Service: General;  Laterality: Left;  ? PORT-A-CATH REMOVAL Right 04/20/2021  ? Procedure: REMOVAL PORT-A-CATH;  Surgeon: Rolm Bookbinder, MD;  Location: [place];  Service: General;  Laterality: Right;  ? PORTACATH PLACEMENT N/A 11/02/2020  ? Procedure: INSERTION PORT-A-CATH;  Surgeon: Rolm Bookbinder, MD;  [facility];  Service: General;  Laterality: N/A;  ? TOTAL MASTECTOMY Right 04/20/2021  ? Procedure: RIGHT TOTAL MASTECTOMY;  Surgeon: Rolm Bookbinder, MD;  Location: [place];  Service: General;  Laterality: Right;  ? ? ?There were no vitals filed for this visit. ? ? Subjective Assessment - 05/18/21 1400   ? ? Subjective I have a place on my right incision that is coming open but I am going to see the doctor on Thursday about it.   ? Pertinent History L breast cancer ER+ PR- Her2-; will complete neoadjuvant chemo then pt plans on having a double mastectomy,   ?  Patient Stated Goals to gain info from provider   ? Currently in Pain? No/denies   ? Pain Score 0-No pain   ? ?  ?  ? ?  ? ? ? ? ? ? ? ? ? ? ? ? ? ? ? ? ? ? ? ? [place] Adult PT Treatment/Exercise - 05/18/21 0001   ? ?  ? Manual Therapy  ? Passive ROM in supine to bilateral shoulders in to flexion, abduction and ER to pt's tolerance, blocked pec on R throughout to keep from pulling at junction where pt has an opening in her wound   ? ?  ?  ? ?  ? ? ? ? ? ? ? ? ? ? ? ? ? ? ? PT Long Term Goals - 05/12/21 1701   ? ?  ? PT LONG TERM GOAL #1  ? Title Pt will not demonstrate any signs or symptoms of lymphedema and will return to baseline shoulder ROM measurements   ? Period Months   ? Status On-going   ? Target Date 06/09/21   ?  ? PT LONG TERM GOAL #2  ? Title Pt will demonstrate 160 degrees of bilateral shoulder flexion to allow her to reach overhead.   ? Baseline R 130 L 105   ? Time 4   ? Period Weeks   ? Status New   ? Target Date 06/09/21   ?  ?  PT LONG TERM GOAL #3  ? Title Pt will demonstrate 160 degrees of bilateral shoulder abduction to allow her to reach out to the sides.   ? Baseline R 100 L 73   ? Time 4   ? Period Weeks   ? Status New   ? Target Date 06/09/21   ?  ? PT LONG TERM GOAL #4  ? Title Pt will demonstrate 80 degrees of bilateral shoulder external rotation to allow her to return to PLOF.   ? Baseline R 66 L 55   ? Time 4   ? Period Weeks   ? Status New   ? Target Date 06/09/21   ?  ? PT LONG TERM GOAL #5  ? Title Pt will be independent in a home exercise program for continued strengthening and stretching.   ? Time 4   ? Period Weeks   ? Status New   ? Target Date 06/09/21   ? ?  ?  ? ?  ? ? ? ? ? ? ? ? Plan - 05/18/21 1401   ? ? Clinical Impression Statement Pt returns to PT. She reports she has been doing her stretches and has been able to use her arms more. She does report a place at the T junction on her R side has come open and she will be seeing her surgeon on Thursday regarding this. Did not do  pulleys or ball up the wall due to wound opening. Will wait to see what her surgeon says. Spent session focused on PROM to bilateral shoulders to improve bilateral ROM while being mindful not to put tension on R scar.   ? PT Frequency 2x / week   ? PT Duration 4 weeks   ? PT Treatment/Interventions ADLs/Self Care Home Management;Therapeutic exercise;Patient/family education;Manual lymph drainage;Scar mobilization;Orthotic Fit/Training;Passive range of motion;Manual techniques;Taping   ? PT Next Visit Plan what did dr say about wound?, PROM to bilateral shoulders, pulleys, ball, instruct in supine dowel exercises when ready   ? PT Home Exercise Plan post op breast exercises   ? Consulted and Agree with Plan of Care Patient   ? ?  ?  ? ?  ? ? ?Patient will benefit from skilled therapeutic intervention in order to improve the following deficits and impairments:  Postural dysfunction, Decreased knowledge of precautions, Pain, Decreased scar mobility, Decreased strength, Increased edema, Impaired UE functional use, Decreased range of motion ? ?Visit Diagnosis: ?Stiffness of right shoulder, not elsewhere classified ? ?Stiffness of left shoulder, not elsewhere classified ? ?Aftercare following surgery for neoplasm ? ?Abnormal posture ? ?Malignant neoplasm of upper-outer quadrant of left breast in female, estrogen receptor positive ([place]) ? ? ? ? ?Problem List ?Patient Active Problem List  ? Diagnosis Date Noted  ? Breast cancer, left breast ([place]) 04/20/2021  ? Port-A-Cath in place 12/29/2020  ? Genetic testing 10/05/2020  ? Malignant neoplasm of upper-outer quadrant of left breast in female, estrogen receptor positive ([place]) 09/25/2020  ? Family history of breast cancer 09/25/2020  ? ? ?Allyson Sabal Jackson, PT ?05/18/2021, 2:03 PM ? ?[place] ?[facility] ?[place], [place], [address] ?Phone: [phone]   Fax:  [phone] ? ?Name: Allison Dodson ?MRN: [id number] ?Date  of Birth: 1995/06/17 ? ? ? ?

## 2021-05-20 ENCOUNTER — Ambulatory Visit: Payer: No Typology Code available for payment source | Admitting: Physical Therapy

## 2021-05-20 ENCOUNTER — Encounter: Payer: Self-pay | Admitting: Physical Therapy

## 2021-05-20 DIAGNOSIS — M25611 Stiffness of right shoulder, not elsewhere classified: Secondary | ICD-10-CM | POA: Diagnosis not present

## 2021-05-20 DIAGNOSIS — Z170 Estrogen receptor positive status [ER+]: Secondary | ICD-10-CM

## 2021-05-20 DIAGNOSIS — M25612 Stiffness of left shoulder, not elsewhere classified: Secondary | ICD-10-CM

## 2021-05-20 DIAGNOSIS — Z483 Aftercare following surgery for neoplasm: Secondary | ICD-10-CM

## 2021-05-20 DIAGNOSIS — R293 Abnormal posture: Secondary | ICD-10-CM

## 2021-05-20 NOTE — Therapy (Signed)
[place] ?[facility] ?[place], [place], [address] ?Phone: [phone]   Fax:  [phone] ? ?Physical Therapy Treatment ? ?Patient Details  ?Name: Allison Dodson ?MRN: [id number] ?Date of Birth: August 10, 1995 ?Referring Provider (PT): Gudena ? ? ?Encounter Date: 05/20/2021 ? ? PT End of Session - 05/20/21 1307   ? ? Visit Number 4   ? Number of Visits 10   ? Date for PT Re-Evaluation 06/09/21   ? PT Start Time 2376   ? PT Stop Time 1350   ? PT Time Calculation (min) 43 min   ? Activity Tolerance Patient tolerated treatment well   ? Behavior During Therapy [facility] for tasks assessed/performed   ? ?  ?  ? ?  ? ? ?Past Medical History:  ?Diagnosis Date  ? Cancer [facility])   ? Family history of breast cancer 09/25/2020  ? ? ?Past Surgical History:  ?Procedure Laterality Date  ? MASTECTOMY W/ SENTINEL NODE BIOPSY Left 04/20/2021  ? Procedure: LEFT MASTECTOMY WITH LEFT AXILLARY SENTINEL LYMPH NODE BIOPSY;  Surgeon: Rolm Bookbinder, MD;  Location: [place];  Service: General;  Laterality: Left;  ? PORT-A-CATH REMOVAL Right 04/20/2021  ? Procedure: REMOVAL PORT-A-CATH;  Surgeon: Rolm Bookbinder, MD;  Location: [place];  Service: General;  Laterality: Right;  ? PORTACATH PLACEMENT N/A 11/02/2020  ? Procedure: INSERTION PORT-A-CATH;  Surgeon: Rolm Bookbinder, MD;  [facility];  Service: General;  Laterality: N/A;  ? TOTAL MASTECTOMY Right 04/20/2021  ? Procedure: RIGHT TOTAL MASTECTOMY;  Surgeon: Rolm Bookbinder, MD;  Location: [place];  Service: General;  Laterality: Right;  ? ? ?There were no vitals filed for this visit. ? ? Subjective Assessment - 05/20/21 1307   ? ? Subjective The doctor debrided the wound and he said I could do anything in therapy.   ? Pertinent History L breast cancer ER+ PR- Her2-; will complete neoadjuvant chemo then pt plans on having a double mastectomy,   ? Patient Stated Goals to gain info  from provider   ? Currently in Pain? No/denies   ? Pain Score 0-No pain   ? ?  ?  ? ?  ? ? ? ? ? ? ? ? ? ? ? ? ? ? ? ? ? ? ? ? [place] Adult PT Treatment/Exercise - 05/20/21 0001   ? ?  ? Exercises  ? Exercises Shoulder   ?  ? Shoulder Exercises: Supine  ? Flexion AAROM;Both;10 reps   with dowel, pt returned therapist demo  ?  ? Shoulder Exercises: Pulleys  ? Flexion 2 minutes   pt returned therapist demo  ? ABduction 2 minutes   pt returned therapist demo  ?  ? Shoulder Exercises: Therapy Ball  ? Flexion Both;10 reps   pt returned therapist demo  ? ABduction Both;10 reps   pt returned therapist demo  ?  ? Manual Therapy  ? Passive ROM in supine to bilateral shoulders in to flexion, abduction and ER to pt's tolerance   ? ?  ?  ? ?  ? ? ? ? ? ? ? ? ? ? ? ? ? ? ? PT Long Term Goals - 05/12/21 1701   ? ?  ? PT LONG TERM GOAL #1  ? Title Pt will not demonstrate any signs or symptoms of lymphedema and will return to baseline shoulder ROM measurements   ? Period Months   ? Status On-going   ? Target  Date 06/09/21   ?  ? PT LONG TERM GOAL #2  ? Title Pt will demonstrate 160 degrees of bilateral shoulder flexion to allow her to reach overhead.   ? Baseline R 130 L 105   ? Time 4   ? Period Weeks   ? Status New   ? Target Date 06/09/21   ?  ? PT LONG TERM GOAL #3  ? Title Pt will demonstrate 160 degrees of bilateral shoulder abduction to allow her to reach out to the sides.   ? Baseline R 100 L 73   ? Time 4   ? Period Weeks   ? Status New   ? Target Date 06/09/21   ?  ? PT LONG TERM GOAL #4  ? Title Pt will demonstrate 80 degrees of bilateral shoulder external rotation to allow her to return to PLOF.   ? Baseline R 66 L 55   ? Time 4   ? Period Weeks   ? Status New   ? Target Date 06/09/21   ?  ? PT LONG TERM GOAL #5  ? Title Pt will be independent in a home exercise program for continued strengthening and stretching.   ? Time 4   ? Period Weeks   ? Status New   ? Target Date 06/09/21   ? ?  ?  ? ?  ? ? ? ? ? ? ? ? Plan -  05/20/21 1358   ? ? Clinical Impression Statement Pt saw her surgeon regarding the opening in her incision on the R side. He told her it was not infected and most likely was due to decreased blood flow. She has no activity limitations so continued with PROM to bilateral shoulders. Began AAROM exercises including pulleys, ball up wall and supine dowel exercise. Her ROM is improving.   ? PT Frequency 2x / week   ? PT Duration 4 weeks   ? PT Treatment/Interventions ADLs/Self Care Home Management;Therapeutic exercise;Patient/family education;Manual lymph drainage;Scar mobilization;Orthotic Fit/Training;Passive range of motion;Manual techniques;Taping   ? PT Next Visit Plan PROM to bilateral shoulders, pulleys, ball, instruct in supine dowel abduction exercises when ready   ? PT Home Exercise Plan post op breast exercises, supine dowel flexion   ? Consulted and Agree with Plan of Care Patient   ? ?  ?  ? ?  ? ? ?Patient will benefit from skilled therapeutic intervention in order to improve the following deficits and impairments:  Postural dysfunction, Decreased knowledge of precautions, Pain, Decreased scar mobility, Decreased strength, Increased edema, Impaired UE functional use, Decreased range of motion ? ?Visit Diagnosis: ?Stiffness of right shoulder, not elsewhere classified ? ?Stiffness of left shoulder, not elsewhere classified ? ?Aftercare following surgery for neoplasm ? ?Abnormal posture ? ?Malignant neoplasm of upper-outer quadrant of left breast in female, estrogen receptor positive ([place]) ? ? ? ? ?Problem List ?Patient Active Problem List  ? Diagnosis Date Noted  ? Breast cancer, left breast ([place]) 04/20/2021  ? Port-A-Cath in place 12/29/2020  ? Genetic testing 10/05/2020  ? Malignant neoplasm of upper-outer quadrant of left breast in female, estrogen receptor positive ([place]) 09/25/2020  ? Family history of breast cancer 09/25/2020  ? ? ?[place], PT ?05/20/2021, 2:02 PM ? ?[place] ?[facility] ?[place], [place], [address] ?Phone: [phone]   Fax:  [phone] ? ?Name: Allison Dodson ?MRN: [id number] ?Date of Birth: 05/25/1995 ? ? ? ?

## 2021-05-25 ENCOUNTER — Encounter: Payer: Self-pay | Admitting: Physical Therapy

## 2021-05-25 ENCOUNTER — Ambulatory Visit: Payer: No Typology Code available for payment source | Admitting: Physical Therapy

## 2021-05-25 DIAGNOSIS — C50412 Malignant neoplasm of upper-outer quadrant of left female breast: Secondary | ICD-10-CM

## 2021-05-25 DIAGNOSIS — M25611 Stiffness of right shoulder, not elsewhere classified: Secondary | ICD-10-CM | POA: Diagnosis not present

## 2021-05-25 DIAGNOSIS — R293 Abnormal posture: Secondary | ICD-10-CM

## 2021-05-25 DIAGNOSIS — M25612 Stiffness of left shoulder, not elsewhere classified: Secondary | ICD-10-CM

## 2021-05-25 DIAGNOSIS — Z483 Aftercare following surgery for neoplasm: Secondary | ICD-10-CM

## 2021-05-25 NOTE — Therapy (Addendum)
?[facility] ?[place], [place], [address] ?Phone: [phone]   Fax:  [phone] ? ?Physical Therapy Treatment ? ?Patient Details  ?Name: Allison Dodson ?MRN: [id number] ?Date of Birth: 12-14-1995 ?Referring Provider (PT): Gudena ? ? ?Encounter Date: 05/25/2021 ? ? PT End of Session - 05/25/21 1318   ? ? Visit Number 5   ? Number of Visits 10   ? Date for PT Re-Evaluation 06/09/21   ? PT Start Time 1306   ? PT Stop Time 2248   ? PT Time Calculation (min) 46 min   ? Activity Tolerance Patient tolerated treatment well   ? Behavior During Therapy [facility] for tasks assessed/performed   ? ?  ?  ? ?  ? ? ?Past Medical History:  ?Diagnosis Date  ? Cancer [facility])   ? Family history of breast cancer 09/25/2020  ? ? ?Past Surgical History:  ?Procedure Laterality Date  ? MASTECTOMY W/ SENTINEL NODE BIOPSY Left 04/20/2021  ? Procedure: LEFT MASTECTOMY WITH LEFT AXILLARY SENTINEL LYMPH NODE BIOPSY;  Surgeon: Rolm Bookbinder, MD;  Location: [place];  Service: General;  Laterality: Left;  ? PORT-A-CATH REMOVAL Right 04/20/2021  ? Procedure: REMOVAL PORT-A-CATH;  Surgeon: Rolm Bookbinder, MD;  Location: [place];  Service: General;  Laterality: Right;  ? PORTACATH PLACEMENT N/A 11/02/2020  ? Procedure: INSERTION PORT-A-CATH;  Surgeon: Rolm Bookbinder, MD;  [facility];  Service: General;  Laterality: N/A;  ? TOTAL MASTECTOMY Right 04/20/2021  ? Procedure: RIGHT TOTAL MASTECTOMY;  Surgeon: Rolm Bookbinder, MD;  Location: [place];  Service: General;  Laterality: Right;  ? ? ?There were no vitals filed for this visit. ? ? Subjective Assessment - 05/25/21 1356   ? ? Subjective My arm is moving better but I feel like there is a spot under my steri strips on the other side that isn't healed.   ? Pertinent History L breast cancer ER+ PR- Her2-; will complete neoadjuvant chemo then pt plans on having a double mastectomy,    ? Patient Stated Goals to gain info from provider   ? Currently in Pain? No/denies   ? Pain Score 0-No pain   ? ?  ?  ? ?  ? ? ? ? ? [facility] PT Assessment - 05/25/21 0001   ? ?  ? AROM  ? Right Shoulder Flexion 159 Degrees   ? Right Shoulder ABduction 155 Degrees   ? Left Shoulder Flexion 140 Degrees   ? Left Shoulder ABduction 110 Degrees   ? ?  ?  ? ?  ? ? ? ? ? ? ? ? ? ? ? ? ? ? ? ? [place] Adult PT Treatment/Exercise - 05/25/21 0001   ? ?  ? Shoulder Exercises: Supine  ? ABduction AAROM;Left;10 reps   with dowel, pt returned therapist demo  ? ?  ?  ? ?  ? ? ? ? ? ?Shoulder Exercises: Pulleys   ?  Flexion 2 minutes   pt returned therapist demo  ?  ABduction 2 minutes   pt returned therapist demo  ?     ?  Shoulder Exercises: Therapy Ball  ?  Flexion Both;10 reps   pt returned therapist demo  ?  ABduction Both;10 reps   pt returned therapist demo  ?     ?  Manual Therapy  ?  Passive ROM in supine to bilateral shoulders in to flexion, abduction and ER to pt's  tolerance   ? ? ? ? ? ? ? ? ? ? ? PT Long Term Goals - 05/12/21 1701   ? ?  ? PT LONG TERM GOAL #1  ? Title Pt will not demonstrate any signs or symptoms of lymphedema and will return to baseline shoulder ROM measurements   ? Period Months   ? Status On-going   ? Target Date 06/09/21   ?  ? PT LONG TERM GOAL #2  ? Title Pt will demonstrate 160 degrees of bilateral shoulder flexion to allow her to reach overhead.   ? Baseline R 130 L 105   ? Time 4   ? Period Weeks   ? Status New   ? Target Date 06/09/21   ?  ? PT LONG TERM GOAL #3  ? Title Pt will demonstrate 160 degrees of bilateral shoulder abduction to allow her to reach out to the sides.   ? Baseline R 100 L 73   ? Time 4   ? Period Weeks   ? Status New   ? Target Date 06/09/21   ?  ? PT LONG TERM GOAL #4  ? Title Pt will demonstrate 80 degrees of bilateral shoulder external rotation to allow her to return to PLOF.   ? Baseline R 66 L 55   ? Time 4   ? Period Weeks   ? Status New   ? Target Date 06/09/21   ?   ? PT LONG TERM GOAL #5  ? Title Pt will be independent in a home exercise program for continued strengthening and stretching.   ? Time 4   ? Period Weeks   ? Status New   ? Target Date 06/09/21   ? ?  ?  ? ?  ? ? ? ? ? ? ? ? Plan - 05/25/21 1357   ? ? Clinical Impression Statement Remeasured bilateral shoulder ROM today and it has improved greatly in all directions. She still has increased tightness on the left side especially with abduction. Instructed pt in AAROM with dowel today and issued as part of pt's HEP. Continued with PROM to bilateral shoulders in direction of flexion, abduction and ER with pt able to achieve full PROM by end of session.   ? PT Treatment/Interventions ADLs/Self Care Home Management;Therapeutic exercise;Patient/family education;Manual lymph drainage;Scar mobilization;Orthotic Fit/Training;Passive range of motion;Manual techniques;Taping   ? PT Next Visit Plan PROM to bilateral shoulders, pulleys, ball, instruct in supine scap when ready   ? Consulted and Agree with Plan of Care Patient   ? ?  ?  ? ?  ? ? ?Patient will benefit from skilled therapeutic intervention in order to improve the following deficits and impairments:  Postural dysfunction, Decreased knowledge of precautions, Pain, Decreased scar mobility, Decreased strength, Increased edema, Impaired UE functional use, Decreased range of motion ? ?Visit Diagnosis: ?Stiffness of right shoulder, not elsewhere classified ? ?Stiffness of left shoulder, not elsewhere classified ? ?Aftercare following surgery for neoplasm ? ?Abnormal posture ? ?Malignant neoplasm of upper-outer quadrant of left breast in female, estrogen receptor positive ([place]) ? ? ? ? ?Problem List ?Patient Active Problem List  ? Diagnosis Date Noted  ? Breast cancer, left breast ([place]) 04/20/2021  ? Port-A-Cath in place 12/29/2020  ? Genetic testing 10/05/2020  ? Malignant neoplasm of upper-outer quadrant of left breast in female, estrogen receptor positive ([place])  09/25/2020  ? Family history of breast cancer 09/25/2020  ? ? ?[place], PT ?05/25/2021, 2:00 PM ? ?[facility] ?[place], [place], [address] ?Phone: [phone]   Fax:  [phone] ? ?Name: Ezella Dodson ?MRN: [id number] ?Date of Birth: December 07, 1995 ? ? ? ?

## 2021-05-25 NOTE — Patient Instructions (Signed)
Shoulder: Flexion (Supine) ? ? ? ?With hands shoulder width apart, slowly lower dowel to floor behind head. Do not let elbows bend. Keep back flat. ?Hold _5-15___ seconds. Repeat __10__ times. Do __1-2__ sessions per day. ?CAUTION: Stretch slowly and gently. ? ?Copyright ? VHI. All rights reserved.  ?Shoulder: Abduction (Supine) ? ? ? ?With right arm flat on floor, hold dowel in palm. ?Slowly move arm up to side of head by pushing with opposite arm. Do not let elbow bend. ?Hold _5-15___ seconds. Repeat __10__ times. Do __1-2__ sessions per day. Repeat on opposite side.  ?CAUTION: Stretch slowly and gently. ? ?Copyright ? VHI. All rights reserved.  ? ?

## 2021-05-27 ENCOUNTER — Ambulatory Visit: Payer: No Typology Code available for payment source | Admitting: Physical Therapy

## 2021-05-27 ENCOUNTER — Encounter: Payer: Self-pay | Admitting: Physical Therapy

## 2021-05-27 DIAGNOSIS — M25611 Stiffness of right shoulder, not elsewhere classified: Secondary | ICD-10-CM | POA: Diagnosis not present

## 2021-05-27 DIAGNOSIS — R293 Abnormal posture: Secondary | ICD-10-CM

## 2021-05-27 DIAGNOSIS — Z483 Aftercare following surgery for neoplasm: Secondary | ICD-10-CM

## 2021-05-27 DIAGNOSIS — Z170 Estrogen receptor positive status [ER+]: Secondary | ICD-10-CM

## 2021-05-27 DIAGNOSIS — M25612 Stiffness of left shoulder, not elsewhere classified: Secondary | ICD-10-CM

## 2021-05-27 NOTE — Patient Instructions (Signed)
Over Head Pull: Narrow and Wide Grip   Cancer Rehab [phone] ? ? ?On back, knees bent, feet flat, band across thighs, elbows straight but relaxed. Pull hands apart (start). Keeping elbows straight, bring arms up and over head, hands toward floor. Keep pull steady on band. Hold momentarily. Return slowly, keeping pull steady, back to start. Then do same with a wider grip on the band (past shoulder width) ?Repeat _10__ times. Band color __yellow____  ? ?Side Pull: Double Arm ? ? ?On back, knees bent, feet flat. Arms perpendicular to body, shoulder level, elbows straight but relaxed. Pull arms out to sides, elbows straight. Resistance band comes across collarbones, hands toward floor. Hold momentarily. Slowly return to starting position. Repeat _10__ times. Band color _yellow____  ? ?Sword ? ? ?On back, knees bent, feet flat, left hand on left hip, right hand above left. Pull right arm DIAGONALLY (hip to shoulder) across chest. Bring right arm along head toward floor. Hold momentarily. Slowly return to starting position. Thumb is pointed down when by hip and rotates upwards when by head.  ?Repeat _10__ times. Do with left arm. Band color _yellow_____  ? ?Shoulder Rotation: Double Arm ? ? ?On back, knees bent, feet flat, elbows tucked at sides, bent 90?, hands palms up. Pull hands apart and down toward floor, keeping elbows near sides. Hold momentarily. Slowly return to starting position. ?Repeat _10__ times. Band color __yellow____  ? ? ?

## 2021-05-27 NOTE — Therapy (Signed)
[facility] ?[facility] ?[place], [place], [address] ?Phone: [phone]   Fax:  [phone] ? ?Physical Therapy Treatment ? ?Patient Details  ?Name: Allison Dodson ?MRN: [id number] ?Date of Birth: 17-Mar-1995 ?Referring Provider (PT): Gudena ? ? ?Encounter Date: 05/27/2021 ? ? PT End of Session - 05/27/21 1301   ? ? Visit Number 6   ? Number of Visits 10   ? Date for PT Re-Evaluation 06/09/21   ? PT Start Time 1302   ? PT Stop Time 7026   ? PT Time Calculation (min) 51 min   ? Activity Tolerance Patient tolerated treatment well   ? Behavior During Therapy [facility] for tasks assessed/performed   ? ?  ?  ? ?  ? ? ?Past Medical History:  ?Diagnosis Date  ? Cancer [facility])   ? Family history of breast cancer 09/25/2020  ? ? ?Past Surgical History:  ?Procedure Laterality Date  ? MASTECTOMY W/ SENTINEL NODE BIOPSY Left 04/20/2021  ? Procedure: LEFT MASTECTOMY WITH LEFT AXILLARY SENTINEL LYMPH NODE BIOPSY;  Surgeon: Rolm Bookbinder, MD;  Location: [facility];  Service: General;  Laterality: Left;  ? PORT-A-CATH REMOVAL Right 04/20/2021  ? Procedure: REMOVAL PORT-A-CATH;  Surgeon: Rolm Bookbinder, MD;  Location: [facility];  Service: General;  Laterality: Right;  ? PORTACATH PLACEMENT N/A 11/02/2020  ? Procedure: INSERTION PORT-A-CATH;  Surgeon: Rolm Bookbinder, MD;  [facility];  Service: General;  Laterality: N/A;  ? TOTAL MASTECTOMY Right 04/20/2021  ? Procedure: RIGHT TOTAL MASTECTOMY;  Surgeon: Rolm Bookbinder, MD;  Location: [facility];  Service: General;  Laterality: Right;  ? ? ?There were no vitals filed for this visit. ? ? Subjective Assessment - 05/27/21 1312   ? ? Subjective The steri strips came off on the other side and there is a small spot that is draining.   ? Pertinent History L breast cancer ER+ PR- Her2-; will complete neoadjuvant chemo then pt plans on having a double mastectomy,   ? Patient Stated Goals  to gain info from provider   ? Currently in Pain? No/denies   ? Pain Score 0-No pain   ? ?  ?  ? ?  ? ? ? ? ? ? ? ? ? ? ? ? ? ? ? ? ? ? ? ? [facility] Adult PT Treatment/Exercise - 05/27/21 0001   ? ?  ? Shoulder Exercises: Supine  ? Horizontal ABduction Strengthening;Both;10 reps;Theraband   pt returned therapist demo  ? Theraband Level (Shoulder Horizontal ABduction) Level 1 (Yellow)   ? External Rotation Strengthening;Both;10 reps;Theraband   pt returned therapist demo  ? Theraband Level (Shoulder External Rotation) Level 1 (Yellow)   ? Flexion Strengthening;Both;10 reps;Theraband   narrow and wide grip, pt returned therapist demo  ? Theraband Level (Shoulder Flexion) Level 1 (Yellow)   ? Diagonals Strengthening;Both;10 reps;Theraband   pt returned therapist demo  ? Theraband Level (Shoulder Diagonals) Level 1 (Yellow)   ?  ? Manual Therapy  ? Manual Therapy Passive ROM;Myofascial release   ? Myofascial Release to cording extending from axilla to antecubital fossa in L upper arm   ? Passive ROM in supine to bilateral shoulders in to flexion, abduction and ER to pt's tolerance   ? ?  ?  ? ?  ? ? ? ? ? ? ? ? ? ? ? ? ? ? ? PT Long Term Goals - 05/12/21 1701   ? ?  ? PT  LONG TERM GOAL #1  ? Title Pt will not demonstrate any signs or symptoms of lymphedema and will return to baseline shoulder ROM measurements   ? Period Months   ? Status On-going   ? Target Date 06/09/21   ?  ? PT LONG TERM GOAL #2  ? Title Pt will demonstrate 160 degrees of bilateral shoulder flexion to allow her to reach overhead.   ? Baseline R 130 L 105   ? Time 4   ? Period Weeks   ? Status New   ? Target Date 06/09/21   ?  ? PT LONG TERM GOAL #3  ? Title Pt will demonstrate 160 degrees of bilateral shoulder abduction to allow her to reach out to the sides.   ? Baseline R 100 L 73   ? Time 4   ? Period Weeks   ? Status New   ? Target Date 06/09/21   ?  ? PT LONG TERM GOAL #4  ? Title Pt will demonstrate 80 degrees of bilateral shoulder external  rotation to allow her to return to PLOF.   ? Baseline R 66 L 55   ? Time 4   ? Period Weeks   ? Status New   ? Target Date 06/09/21   ?  ? PT LONG TERM GOAL #5  ? Title Pt will be independent in a home exercise program for continued strengthening and stretching.   ? Time 4   ? Period Weeks   ? Status New   ? Target Date 06/09/21   ? ?  ?  ? ?  ? ? ? ? ? ? ? ? Plan - 05/27/21 1357   ? ? Clinical Impression Statement Instructed pt in supine scapular strengthening exercises today since pt's ROM is improving greatly. She did not have any pain with new exercises so issued these at part of pt's HEP. Began myofascial release to L upper arm in area of cording with at least 2 cords palpable then focused on PROM to bilateral shoulders in all directions to improve ROM. L shoulder ROM is still more tight than her R. Educated pt in low back stretch in hook lying with L arm outstretched and knees falling to R to help stretch cording.   ? PT Frequency 2x / week   ? PT Duration 4 weeks   ? PT Treatment/Interventions ADLs/Self Care Home Management;Therapeutic exercise;Patient/family education;Manual lymph drainage;Scar mobilization;Orthotic Fit/Training;Passive range of motion;Manual techniques;Taping   ? PT Next Visit Plan PROM to bilateral shoulders, pulleys, ball, assess indep with supine scap series   ? PT Home Exercise Plan post op breast exercises, supine dowel flexion, supine scap series   ? Consulted and Agree with Plan of Care Patient   ? ?  ?  ? ?  ? ? ?Patient will benefit from skilled therapeutic intervention in order to improve the following deficits and impairments:  Postural dysfunction, Decreased knowledge of precautions, Pain, Decreased scar mobility, Decreased strength, Increased edema, Impaired UE functional use, Decreased range of motion ? ?Visit Diagnosis: ?Stiffness of right shoulder, not elsewhere classified ? ?Stiffness of left shoulder, not elsewhere classified ? ?Aftercare following surgery for  neoplasm ? ?Abnormal posture ? ?Malignant neoplasm of upper-outer quadrant of left breast in female, estrogen receptor positive ([place]) ? ? ? ? ?Problem List ?Patient Active Problem List  ? Diagnosis Date Noted  ? Breast cancer, left breast ([place]) 04/20/2021  ? Port-A-Cath in place 12/29/2020  ? Genetic testing 10/05/2020  ? Malignant  neoplasm of upper-outer quadrant of left breast in female, estrogen receptor positive ([place]) 09/25/2020  ? Family history of breast cancer 09/25/2020  ? ? ?Allyson Sabal [place], PT ?05/27/2021, 1:59 PM ? ?[facility] ?[facility] ?[place], [place], [address] ?Phone: [phone]   Fax:  [phone] ? ?Name: Allison Dodson ?MRN: [id number] ?Date of Birth: 1995/10/11 ? ? ? ?

## 2021-06-01 ENCOUNTER — Encounter: Payer: Self-pay | Admitting: Physical Therapy

## 2021-06-01 ENCOUNTER — Ambulatory Visit: Payer: No Typology Code available for payment source | Admitting: Physical Therapy

## 2021-06-01 DIAGNOSIS — C50412 Malignant neoplasm of upper-outer quadrant of left female breast: Secondary | ICD-10-CM

## 2021-06-01 DIAGNOSIS — M25611 Stiffness of right shoulder, not elsewhere classified: Secondary | ICD-10-CM

## 2021-06-01 DIAGNOSIS — Z483 Aftercare following surgery for neoplasm: Secondary | ICD-10-CM

## 2021-06-01 DIAGNOSIS — M25612 Stiffness of left shoulder, not elsewhere classified: Secondary | ICD-10-CM

## 2021-06-01 DIAGNOSIS — R293 Abnormal posture: Secondary | ICD-10-CM

## 2021-06-01 NOTE — Therapy (Signed)
[facility] ?[facility] ?[place], [place], [address] ?Phone: [phone]   Fax:  [phone] ? ?Physical Therapy Treatment ? ?Patient Details  ?Name: Allison Dodson ?MRN: [id number] ?Date of Birth: Nov 11, 1995 ?Referring Provider (PT): Gudena ? ? ?Encounter Date: 06/01/2021 ? ? PT End of Session - 06/01/21 1314   ? ? Visit Number 7   ? Number of Visits 10   ? Date for PT Re-Evaluation 06/09/21   ? PT Start Time 1303   ? PT Stop Time 6962   ? PT Time Calculation (min) 50 min   ? Activity Tolerance Patient tolerated treatment well   ? Behavior During Therapy [facility] for tasks assessed/performed   ? ?  ?  ? ?  ? ? ?Past Medical History:  ?Diagnosis Date  ? Cancer [facility])   ? Family history of breast cancer 09/25/2020  ? ? ?Past Surgical History:  ?Procedure Laterality Date  ? MASTECTOMY W/ SENTINEL NODE BIOPSY Left 04/20/2021  ? Procedure: LEFT MASTECTOMY WITH LEFT AXILLARY SENTINEL LYMPH NODE BIOPSY;  Surgeon: Rolm Bookbinder, MD;  Location: [facility];  Service: General;  Laterality: Left;  ? PORT-A-CATH REMOVAL Right 04/20/2021  ? Procedure: REMOVAL PORT-A-CATH;  Surgeon: Rolm Bookbinder, MD;  Location: [facility];  Service: General;  Laterality: Right;  ? PORTACATH PLACEMENT N/A 11/02/2020  ? Procedure: INSERTION PORT-A-CATH;  Surgeon: Rolm Bookbinder, MD;  [facility];  Service: General;  Laterality: N/A;  ? TOTAL MASTECTOMY Right 04/20/2021  ? Procedure: RIGHT TOTAL MASTECTOMY;  Surgeon: Rolm Bookbinder, MD;  Location: [facility];  Service: General;  Laterality: Right;  ? ? ?There were no vitals filed for this visit. ? ? Subjective Assessment - 06/01/21 1314   ? ? Subjective Almost all of my steri strips have come off. There is a small open spot on the other side.   ? Pertinent History L breast cancer ER+ PR- Her2-; will complete neoadjuvant chemo then pt plans on having a double mastectomy,   ? Patient Stated  Goals to gain info from provider   ? Currently in Pain? No/denies   ? Pain Score 0-No pain   ? ?  ?  ? ?  ? ? ? ? ? [facility] PT Assessment - 06/01/21 0001   ? ?  ? AROM  ? Right Shoulder Flexion 164 Degrees   ? Right Shoulder ABduction 164 Degrees   ? Left Shoulder Flexion 142 Degrees   ? Left Shoulder ABduction 129 Degrees   ? ?  ?  ? ?  ? ? ? ? ? ? ? ? ? ? ? ? ? ? ? ? [facility] Adult PT Treatment/Exercise - 06/01/21 0001   ? ?  ? Shoulder Exercises: Pulleys  ? Flexion 2 minutes   pt returned therapist demo  ? ABduction 2 minutes   pt returned therapist demo  ?  ? Shoulder Exercises: Therapy Ball  ? Flexion Both;10 reps   pt returned therapist demo  ? ABduction Both;10 reps   pt returned therapist demo  ?  ? Manual Therapy  ? Manual Therapy Passive ROM;Myofascial release   ? Myofascial Release to cording extending from axilla to antecubital fossa in L upper arm   ? Passive ROM in supine to L shoulder in to flexion, abduction and ER to pt's tolerance, 1st half done while lying over half foam roll for a pec stretch   ? ?  ?  ? ?  ? ? ? ? ? ? ? ? ? ? ? ? ? ? ?  PT Long Term Goals - 05/12/21 1701   ? ?  ? PT LONG TERM GOAL #1  ? Title Pt will not demonstrate any signs or symptoms of lymphedema and will return to baseline shoulder ROM measurements   ? Period Months   ? Status On-going   ? Target Date 06/09/21   ?  ? PT LONG TERM GOAL #2  ? Title Pt will demonstrate 160 degrees of bilateral shoulder flexion to allow her to reach overhead.   ? Baseline R 130 L 105   ? Time 4   ? Period Weeks   ? Status New   ? Target Date 06/09/21   ?  ? PT LONG TERM GOAL #3  ? Title Pt will demonstrate 160 degrees of bilateral shoulder abduction to allow her to reach out to the sides.   ? Baseline R 100 L 73   ? Time 4   ? Period Weeks   ? Status New   ? Target Date 06/09/21   ?  ? PT LONG TERM GOAL #4  ? Title Pt will demonstrate 80 degrees of bilateral shoulder external rotation to allow her to return to PLOF.   ? Baseline R 66 L 55   ? Time 4    ? Period Weeks   ? Status New   ? Target Date 06/09/21   ?  ? PT LONG TERM GOAL #5  ? Title Pt will be independent in a home exercise program for continued strengthening and stretching.   ? Time 4   ? Period Weeks   ? Status New   ? Target Date 06/09/21   ? ?  ?  ? ?  ? ? ? ? ? ? ? ? Plan - 06/01/21 1356   ? ? Clinical Impression Statement Pt reports her ROM is improving. Remeasured bilateral shoulder ROM and it has improved greatly since last session. Continued with AAROM followed by PROM focused on L UE today. Also had pt lie over 1/2 foam roll for a pec stretch during PROM at beginning of session. Pt continues to demonstrate cording in L axilla extending down upper arm so performed myofascial release to help decrease this.   ? PT Treatment/Interventions ADLs/Self Care Home Management;Therapeutic exercise;Patient/family education;Manual lymph drainage;Scar mobilization;Orthotic Fit/Training;Passive range of motion;Manual techniques;Taping   ? PT Next Visit Plan PROM to bilateral shoulders, pulleys, ball, assess indep with supine scap series   ? PT Home Exercise Plan post op breast exercises, supine dowel flexion, supine scap series   ? Consulted and Agree with Plan of Care Patient   ? ?  ?  ? ?  ? ? ?Patient will benefit from skilled therapeutic intervention in order to improve the following deficits and impairments:  Postural dysfunction, Decreased knowledge of precautions, Pain, Decreased scar mobility, Decreased strength, Increased edema, Impaired UE functional use, Decreased range of motion ? ?Visit Diagnosis: ?Stiffness of right shoulder, not elsewhere classified ? ?Stiffness of left shoulder, not elsewhere classified ? ?Aftercare following surgery for neoplasm ? ?Abnormal posture ? ?Malignant neoplasm of upper-outer quadrant of left breast in female, estrogen receptor positive ([place]) ? ? ? ? ?Problem List ?Patient Active Problem List  ? Diagnosis Date Noted  ? Breast cancer, left breast ([place]) 04/20/2021  ?  Port-A-Cath in place 12/29/2020  ? Genetic testing 10/05/2020  ? Malignant neoplasm of upper-outer quadrant of left breast in female, estrogen receptor positive ([place]) 09/25/2020  ? Family history of breast cancer 09/25/2020  ? ? ?Allyson Sabal  [place], PT ?06/01/2021, 2:00 PM ? ?[facility] ?[facility] ?[place], [place], [address] ?Phone: [phone]   Fax:  [phone] ? ?Name: Tiesha Dodson ?MRN: [id number] ?Date of Birth: Jul 17, 1995 ? ? ? ?

## 2021-06-03 ENCOUNTER — Ambulatory Visit: Payer: No Typology Code available for payment source | Admitting: Physical Therapy

## 2021-06-03 ENCOUNTER — Encounter: Payer: Self-pay | Admitting: Physical Therapy

## 2021-06-03 DIAGNOSIS — Z483 Aftercare following surgery for neoplasm: Secondary | ICD-10-CM

## 2021-06-03 DIAGNOSIS — R293 Abnormal posture: Secondary | ICD-10-CM

## 2021-06-03 DIAGNOSIS — M25611 Stiffness of right shoulder, not elsewhere classified: Secondary | ICD-10-CM | POA: Diagnosis not present

## 2021-06-03 DIAGNOSIS — M25612 Stiffness of left shoulder, not elsewhere classified: Secondary | ICD-10-CM

## 2021-06-03 DIAGNOSIS — Z170 Estrogen receptor positive status [ER+]: Secondary | ICD-10-CM

## 2021-06-03 NOTE — Therapy (Addendum)
[facility] @ [place], [place], [address] Phone: [phone]   Fax:  [phone]  Physical Therapy Treatment  Patient Details  Name: Allison Dodson MRN: [id number] Date of Birth: Jul 04, 1995 Referring Provider (PT): Lindi Adie   Encounter Date: 06/03/2021   PT End of Session - 06/03/21 1419     Visit Number 8    Number of Visits 10    Date for PT Re-Evaluation 06/09/21    PT Start Time 7510    PT Stop Time 2585    PT Time Calculation (min) 51 min    Activity Tolerance Patient tolerated treatment well    Behavior During Therapy [facility] for tasks assessed/performed             Past Medical History:  Diagnosis Date   Cancer ([place])    Family history of breast cancer 09/25/2020    Past Surgical History:  Procedure Laterality Date   MASTECTOMY W/ SENTINEL NODE BIOPSY Left 04/20/2021   Procedure: LEFT MASTECTOMY WITH LEFT AXILLARY SENTINEL LYMPH NODE BIOPSY;  Surgeon: Rolm Bookbinder, MD;  Location: [facility];  Service: General;  Laterality: Left;   PORT-A-CATH REMOVAL Right 04/20/2021   Procedure: REMOVAL PORT-A-CATH;  Surgeon: Rolm Bookbinder, MD;  Location: [facility];  Service: General;  Laterality: Right;   PORTACATH PLACEMENT N/A 11/02/2020   Procedure: INSERTION PORT-A-CATH;  Surgeon: Rolm Bookbinder, MD;  [facility];  Service: General;  Laterality: N/A;   TOTAL MASTECTOMY Right 04/20/2021   Procedure: RIGHT TOTAL MASTECTOMY;  Surgeon: Rolm Bookbinder, MD;  Location: [facility];  Service: General;  Laterality: Right;    There were no vitals filed for this visit.   Subjective Assessment - 06/03/21 1459     Subjective The tightness is getting better.    Pertinent History L breast cancer ER+ PR- Her2-; will complete neoadjuvant chemo then pt plans on having a double mastectomy,    Patient Stated Goals to gain info from provider    Currently in Pain?  No/denies    Pain Score 0-No pain                               [facility] Adult PT Treatment/Exercise - 06/03/21 0001       Shoulder Exercises: Pulleys   Flexion 2 minutes   pt returned therapist demo   ABduction 2 minutes   pt returned therapist demo     Shoulder Exercises: Therapy Ball   Flexion Both;10 reps   pt returned therapist demo   ABduction Both;10 reps   pt returned therapist demo     Manual Therapy   Myofascial Release to cording extending from axilla to antecubital fossa in L upper arm    Passive ROM in supine to L shoulder in to flexion, abduction and ER to pt's tolerance, 1st half done while lying over half foam roll for a pec stretch                          PT Long Term Goals - 05/12/21 1701       PT LONG TERM GOAL #1   Title Pt will not demonstrate any signs or symptoms of lymphedema and will return to baseline shoulder ROM measurements    Period Months    Status On-going    Target Date 06/09/21      PT LONG  TERM GOAL #2   Title Pt will demonstrate 160 degrees of bilateral shoulder flexion to allow her to reach overhead.    Baseline R 130 L 105    Time 4    Period Weeks    Status New    Target Date 06/09/21      PT LONG TERM GOAL #3   Title Pt will demonstrate 160 degrees of bilateral shoulder abduction to allow her to reach out to the sides.    Baseline R 100 L 73    Time 4    Period Weeks    Status New    Target Date 06/09/21      PT LONG TERM GOAL #4   Title Pt will demonstrate 80 degrees of bilateral shoulder external rotation to allow her to return to PLOF.    Baseline R 66 L 55    Time 4    Period Weeks    Status New    Target Date 06/09/21      PT LONG TERM GOAL #5   Title Pt will be independent in a home exercise program for continued strengthening and stretching.    Time 4    Period Weeks    Status New    Target Date 06/09/21                   Plan - 06/03/21 1457     Clinical  Impression Statement Pt reports the tightness across her chest is improving. Continued with AAROM and PROM today to L shoulder to improve ROM at end range. Pt still has tightness at end range and still has one palpabe cord that extends from axilla through upper arm. Continued with myofascial release to cording to help decrease tightness and improve ROM.    PT Frequency 2x / week    PT Duration 4 weeks    PT Treatment/Interventions ADLs/Self Care Home Management;Therapeutic exercise;Patient/family education;Manual lymph drainage;Scar mobilization;Orthotic Fit/Training;Passive range of motion;Manual techniques;Taping    PT Next Visit Plan MFR, cording L axilla, PROM to bilateral shoulders, pulleys, ball, assess indep with supine scap series, eventually instruct in Strength ABC once ROM improves to baseline    PT Home Exercise Plan post op breast exercises, supine dowel flexion, supine scap series    Consulted and Agree with Plan of Care Patient             Patient will benefit from skilled therapeutic intervention in order to improve the following deficits and impairments:  Postural dysfunction, Decreased knowledge of precautions, Pain, Decreased scar mobility, Decreased strength, Increased edema, Impaired UE functional use, Decreased range of motion  Visit Diagnosis: Stiffness of right shoulder, not elsewhere classified  Stiffness of left shoulder, not elsewhere classified  Aftercare following surgery for neoplasm  Abnormal posture  Malignant neoplasm of upper-outer quadrant of left breast in female, estrogen receptor positive [facility])     Problem List Patient Active Problem List   Diagnosis Date Noted   Breast cancer, left breast ([place]) 04/20/2021   Port-A-Cath in place 12/29/2020   Genetic testing 10/05/2020   Malignant neoplasm of upper-outer quadrant of left breast in female, estrogen receptor positive ([place]) 09/25/2020   Family history of breast cancer 09/25/2020    Manus Gunning, PT 06/03/2021, 3:55 PM  [facility] @ [place], [place], [address] Phone: [phone]   Fax:  [phone]  Name: Carlicia Leavens MRN: [id number] Date of Birth: 1995/06/05

## 2021-06-08 ENCOUNTER — Ambulatory Visit: Payer: No Typology Code available for payment source | Admitting: Physical Therapy

## 2021-06-08 ENCOUNTER — Encounter: Payer: Self-pay | Admitting: Physical Therapy

## 2021-06-08 DIAGNOSIS — Z483 Aftercare following surgery for neoplasm: Secondary | ICD-10-CM

## 2021-06-08 DIAGNOSIS — M25611 Stiffness of right shoulder, not elsewhere classified: Secondary | ICD-10-CM | POA: Diagnosis not present

## 2021-06-08 DIAGNOSIS — R293 Abnormal posture: Secondary | ICD-10-CM

## 2021-06-08 DIAGNOSIS — M25612 Stiffness of left shoulder, not elsewhere classified: Secondary | ICD-10-CM

## 2021-06-08 NOTE — Therapy (Signed)
[place] @ [place], [place], [address] Phone: [phone]   Fax:  [phone]  Physical Therapy Treatment  Patient Details  Name: Allison Dodson MRN: [id number] Date of Birth: 1995/05/19 Referring Provider (PT): Lindi Adie   Encounter Date: 06/08/2021   PT End of Session - 06/08/21 1221     Visit Number 9    Number of Visits 10    Date for PT Re-Evaluation 06/09/21    PT Start Time 1005    PT Stop Time 4627    PT Time Calculation (min) 48 min    Activity Tolerance Patient tolerated treatment well    Behavior During Therapy [facility] for tasks assessed/performed             Past Medical History:  Diagnosis Date   Cancer ([place])    Family history of breast cancer 09/25/2020    Past Surgical History:  Procedure Laterality Date   MASTECTOMY W/ SENTINEL NODE BIOPSY Left 04/20/2021   Procedure: LEFT MASTECTOMY WITH LEFT AXILLARY SENTINEL LYMPH NODE BIOPSY;  Surgeon: Rolm Bookbinder, MD;  Location: [place];  Service: General;  Laterality: Left;   PORT-A-CATH REMOVAL Right 04/20/2021   Procedure: REMOVAL PORT-A-CATH;  Surgeon: Rolm Bookbinder, MD;  Location: [place];  Service: General;  Laterality: Right;   PORTACATH PLACEMENT N/A 11/02/2020   Procedure: INSERTION PORT-A-CATH;  Surgeon: Rolm Bookbinder, MD;  [facility];  Service: General;  Laterality: N/A;   TOTAL MASTECTOMY Right 04/20/2021   Procedure: RIGHT TOTAL MASTECTOMY;  Surgeon: Rolm Bookbinder, MD;  Location: [place];  Service: General;  Laterality: Right;    There were no vitals filed for this visit.   Subjective Assessment - 06/08/21 1012     Subjective Pt states she is getting better. She tries to walk every day.  She is still having some healing from her incisions    Pertinent History L breast cancer ER+ PR- Her2-; will complete neoadjuvant chemo then pt plans on having a double  mastectomy,    Patient Stated Goals to return left shoulder to full funcion and get rid of the cording in her left arm    Currently in Pain? No/denies                [facility] Adult PT Treatment/Exercise - 06/08/2021    Perform AROM to neck,scapula and shoulders in sitting, Pt to supine for PROM and stretching to left shoulder and stretching into end range.  Included shoulder flexion, abduction and external rotation. Also performed lower trunk rotation with pt monitoring feelings of stretch in chest and at incisions. The to right sidelying for 10 reps of external rotation, flexion abduction, small controlled circles followed by end range stretching overhead within tolerance.Repeated on other side.  Also included scapular retraction and protraction along with general scapular mobility and deep breathing. Manual work with gentle stretching at chest and myofascial release along left upper arm and area of cord which patient says is lessening                    PT Long Term Goals - 05/12/21 1701       PT LONG TERM GOAL #1   Title Pt will not demonstrate any signs or symptoms of lymphedema and will return to baseline shoulder ROM measurements    Period Months    Status On-going    Target Date 06/09/21      PT LONG TERM  GOAL #2   Title Pt will demonstrate 160 degrees of bilateral shoulder flexion to allow her to reach overhead.    Baseline R 130 L 105    Time 4    Period Weeks    Status New    Target Date 06/09/21      PT LONG TERM GOAL #3   Title Pt will demonstrate 160 degrees of bilateral shoulder abduction to allow her to reach out to the sides.    Baseline R 100 L 73    Time 4    Period Weeks    Status New    Target Date 06/09/21      PT LONG TERM GOAL #4   Title Pt will demonstrate 80 degrees of bilateral shoulder external rotation to allow her to return to PLOF.    Baseline R 66 L 55    Time 4    Period Weeks    Status New    Target Date 06/09/21      PT  LONG TERM GOAL #5   Title Pt will be independent in a home exercise program for continued strengthening and stretching.    Time 4    Period Weeks    Status New    Target Date 06/09/21              Plan - 05/23//23       Clinical Impression Statement Pt reports her ROM is improving and that she always feels better after PT. She still has some stiffness at end range of left shoulder and feels cording at left medial elbow. She is monitoring incision healing and overall feels better     PT Treatment/Interventions ADLs/Self Care Home Management;Therapeutic exercise;Patient/family education;Manual lymph drainage;Scar mobilization;Orthotic Fit/Training;Passive range of motion;Manual techniques;Taping     PT Next Visit Plan Renew or discharge ?? PROM to bilateral shoulders, pulleys, ball, assess indep with supine scap series progress strengthening     PT Home Exercise Plan post op breast exercises, supine dowel flexion, supine scap series     Consulted and Agree with Plan of Care Patient                Patient will benefit from skilled therapeutic intervention in order to improve the following deficits and impairments:     Visit Diagnosis: Stiffness of right shoulder, not elsewhere classified  Stiffness of left shoulder, not elsewhere classified  Aftercare following surgery for neoplasm  Abnormal posture     Problem List Patient Active Problem List   Diagnosis Date Noted   Breast cancer, left breast ([place]) 04/20/2021   Port-A-Cath in place 12/29/2020   Genetic testing 10/05/2020   Malignant neoplasm of upper-outer quadrant of left breast in female, estrogen receptor positive ([place]) 09/25/2020   Family history of breast cancer 09/25/2020   Donato Heinz. Owens Shark PT  Norwood Levo, PT 06/08/2021, 12:26 PM  [place] @ [place], [place], [address] Phone: [phone]   Fax:  [phone]  Name: Allison Dodson MRN: [id number] Date of Birth: 1995/06/29

## 2021-06-10 ENCOUNTER — Ambulatory Visit: Payer: No Typology Code available for payment source

## 2021-06-10 DIAGNOSIS — C50412 Malignant neoplasm of upper-outer quadrant of left female breast: Secondary | ICD-10-CM

## 2021-06-10 DIAGNOSIS — M25612 Stiffness of left shoulder, not elsewhere classified: Secondary | ICD-10-CM

## 2021-06-10 DIAGNOSIS — Z483 Aftercare following surgery for neoplasm: Secondary | ICD-10-CM

## 2021-06-10 DIAGNOSIS — R293 Abnormal posture: Secondary | ICD-10-CM

## 2021-06-10 DIAGNOSIS — M25611 Stiffness of right shoulder, not elsewhere classified: Secondary | ICD-10-CM

## 2021-06-10 NOTE — Therapy (Signed)
[place] @ [place], [place], [address] Phone: [phone]   Fax:  [phone]  Physical Therapy Treatment  Patient Details  Name: Allison Dodson MRN: [id number] Date of Birth: Jun 18, 1995 Referring Provider (PT): Lindi Adie   Encounter Date: 06/10/2021   PT End of Session - 06/10/21 1010     Visit Number 10    Number of Visits 18    Date for PT Re-Evaluation 07/08/21    PT Start Time 1006    PT Stop Time 1100    PT Time Calculation (min) 54 min    Activity Tolerance Patient tolerated treatment well    Behavior During Therapy [facility] for tasks assessed/performed             Past Medical History:  Diagnosis Date   Cancer ([place])    Family history of breast cancer 09/25/2020    Past Surgical History:  Procedure Laterality Date   MASTECTOMY W/ SENTINEL NODE BIOPSY Left 04/20/2021   Procedure: LEFT MASTECTOMY WITH LEFT AXILLARY SENTINEL LYMPH NODE BIOPSY;  Surgeon: Rolm Bookbinder, MD;  Location: [place];  Service: General;  Laterality: Left;   PORT-A-CATH REMOVAL Right 04/20/2021   Procedure: REMOVAL PORT-A-CATH;  Surgeon: Rolm Bookbinder, MD;  Location: [place];  Service: General;  Laterality: Right;   PORTACATH PLACEMENT N/A 11/02/2020   Procedure: INSERTION PORT-A-CATH;  Surgeon: Rolm Bookbinder, MD;  [facility];  Service: General;  Laterality: N/A;   TOTAL MASTECTOMY Right 04/20/2021   Procedure: RIGHT TOTAL MASTECTOMY;  Surgeon: Rolm Bookbinder, MD;  Location: [place];  Service: General;  Laterality: Right;    There were no vitals filed for this visit.       [facility] PT Assessment - 06/10/21 0001       AROM   Right Shoulder Flexion 165 Degrees    Right Shoulder ABduction 166 Degrees    Right Shoulder External Rotation 90 Degrees    Left Shoulder Flexion 145 Degrees    Left Shoulder ABduction 145 Degrees    Left Shoulder External Rotation  80 Degrees                           [facility] Adult PT Treatment/Exercise - 06/10/21 0001       Manual Therapy   Myofascial Release to cording extending from axilla to antecubital fossa in L upper arm    Passive ROM in supine to Lt shoulder into flexion, abduction and D2 to pt's tolerance, then briefly same to Rt                          PT Long Term Goals - 06/10/21 1211       PT LONG TERM GOAL #1   Title Pt will not demonstrate any signs or symptoms of lymphedema and will return to baseline shoulder ROM measurements    Baseline Pt does not demonstrate any signs of lymphedema and her Rt shoulder has returned to baseline, her Lt is progressing well, see below - 06/10/21    Status Partially Met      PT LONG TERM GOAL #2   Title Pt will demonstrate 160 degrees of bilateral shoulder flexion to allow her to reach overhead.    Baseline R 130 L 105; Rt 165 and Lt 145 degrees - 06/10/21    Status Partially Met      PT LONG  TERM GOAL #3   Title Pt will demonstrate 160 degrees of bilateral shoulder abduction to allow her to reach out to the sides.    Baseline R 100 L 73; Rt 166 and Lt 145 degrees - 06/10/21    Status Partially Met      PT LONG TERM GOAL #4   Title Pt will demonstrate 80 degrees of bilateral shoulder external rotation to allow her to return to PLOF.    Baseline R 66 L 55; Rt 90 and Lt 80 degrees - 06/10/21    Status Achieved      PT LONG TERM GOAL #5   Title Pt will be independent in a home exercise program for continued strengthening and stretching.    Baseline Pt is independent with initial HEP - 06/10/21    Status Partially Met                   Plan - 06/10/21 1102     Clinical Impression Statement Remeasured pts bil shoulder A/ROM and assessed current functional status to update goals. Pt has made excellent progress and met Rt shoulder goals. She has made good progress with Lt towards goals but has yet to fully meet those so we  will renew Ms.Ward at this time to cont towards unmet goals to improve end Lt shoulder P/A/ROM and further decrease cording. Also to progress her HEP to include strengthening with the Strength ABC program.    Stability/Clinical Decision Making Stable/Uncomplicated    Rehab Potential Good    PT Frequency 2x / week    PT Duration 4 weeks    PT Treatment/Interventions ADLs/Self Care Home Management;Therapeutic exercise;Patient/family education;Manual lymph drainage;Scar mobilization;Orthotic Fit/Training;Passive range of motion;Manual techniques;Taping    PT Next Visit Plan Renewal done this session, pt may want to reduce to 1x/wk after next week. MFR, cording L axilla, PROM to bilateral shoulders, pulleys, ball, assess indep with supine scap series, eventually instruct in Strength ABC once ROM improves to baseline    PT Home Exercise Plan post op breast exercises, supine dowel flexion, supine scap series    Consulted and Agree with Plan of Care Patient             Patient will benefit from skilled therapeutic intervention in order to improve the following deficits and impairments:  Postural dysfunction, Decreased knowledge of precautions, Pain, Decreased scar mobility, Decreased strength, Increased edema, Impaired UE functional use, Decreased range of motion  Visit Diagnosis: Stiffness of right shoulder, not elsewhere classified  Stiffness of left shoulder, not elsewhere classified  Aftercare following surgery for neoplasm  Abnormal posture  Malignant neoplasm of upper-outer quadrant of left breast in female, estrogen receptor positive [facility])     Problem List Patient Active Problem List   Diagnosis Date Noted   Breast cancer, left breast ([place]) 04/20/2021   Port-A-Cath in place 12/29/2020   Genetic testing 10/05/2020   Malignant neoplasm of upper-outer quadrant of left breast in female, estrogen receptor positive ([place]) 09/25/2020   Family history of breast cancer 09/25/2020     Otelia Limes, PTA 06/10/2021, 12:16 PM  [place] @ [place], [place], [address] Phone: [phone]   Fax:  [phone]  Name: Allison Dodson MRN: [id number] Date of Birth: 07-Aug-1995

## 2021-06-17 ENCOUNTER — Other Ambulatory Visit: Payer: Self-pay

## 2021-06-17 ENCOUNTER — Inpatient Hospital Stay: Payer: No Typology Code available for payment source | Attending: Hematology and Oncology

## 2021-06-17 VITALS — BP 122/98 | HR 97 | Resp 18

## 2021-06-17 DIAGNOSIS — Z170 Estrogen receptor positive status [ER+]: Secondary | ICD-10-CM

## 2021-06-17 DIAGNOSIS — Z5111 Encounter for antineoplastic chemotherapy: Secondary | ICD-10-CM | POA: Diagnosis present

## 2021-06-17 DIAGNOSIS — C50412 Malignant neoplasm of upper-outer quadrant of left female breast: Secondary | ICD-10-CM | POA: Diagnosis present

## 2021-06-17 MED ORDER — GOSERELIN ACETATE 3.6 MG ~~LOC~~ IMPL
3.6000 mg | DRUG_IMPLANT | Freq: Once | SUBCUTANEOUS | Status: AC
  Administered 2021-06-17: 3.6 mg via SUBCUTANEOUS
  Filled 2021-06-17: qty 3.6

## 2021-06-18 ENCOUNTER — Ambulatory Visit: Payer: No Typology Code available for payment source | Attending: Hematology and Oncology

## 2021-06-18 DIAGNOSIS — M25611 Stiffness of right shoulder, not elsewhere classified: Secondary | ICD-10-CM | POA: Insufficient documentation

## 2021-06-18 DIAGNOSIS — C50412 Malignant neoplasm of upper-outer quadrant of left female breast: Secondary | ICD-10-CM | POA: Insufficient documentation

## 2021-06-18 DIAGNOSIS — R293 Abnormal posture: Secondary | ICD-10-CM | POA: Diagnosis present

## 2021-06-18 DIAGNOSIS — M25612 Stiffness of left shoulder, not elsewhere classified: Secondary | ICD-10-CM | POA: Insufficient documentation

## 2021-06-18 DIAGNOSIS — Z483 Aftercare following surgery for neoplasm: Secondary | ICD-10-CM | POA: Diagnosis present

## 2021-06-18 DIAGNOSIS — Z170 Estrogen receptor positive status [ER+]: Secondary | ICD-10-CM | POA: Insufficient documentation

## 2021-06-18 NOTE — Therapy (Signed)
[place] @ [place], [place], [address] Phone: [phone]   Fax:  [phone]  Physical Therapy Treatment  Patient Details  Name: Allison Dodson MRN: [id number] Date of Birth: 1995/11/05 Referring Provider (PT): Lindi Adie   Encounter Date: 06/18/2021   PT End of Session - 06/18/21 0912     Visit Number 11    Number of Visits 18    Date for PT Re-Evaluation 07/08/21    PT Start Time 0905    PT Stop Time 1004    PT Time Calculation (min) 59 min    Activity Tolerance Patient tolerated treatment well    Behavior During Therapy [facility] for tasks assessed/performed             Past Medical History:  Diagnosis Date   Cancer [facility])    Family history of breast cancer 09/25/2020    Past Surgical History:  Procedure Laterality Date   MASTECTOMY W/ SENTINEL NODE BIOPSY Left 04/20/2021   Procedure: LEFT MASTECTOMY WITH LEFT AXILLARY SENTINEL LYMPH NODE BIOPSY;  Surgeon: Rolm Bookbinder, MD;  Location: [place];  Service: General;  Laterality: Left;   PORT-A-CATH REMOVAL Right 04/20/2021   Procedure: REMOVAL PORT-A-CATH;  Surgeon: Rolm Bookbinder, MD;  Location: [place];  Service: General;  Laterality: Right;   PORTACATH PLACEMENT N/A 11/02/2020   Procedure: INSERTION PORT-A-CATH;  Surgeon: Rolm Bookbinder, MD;  [facility];  Service: General;  Laterality: N/A;   TOTAL MASTECTOMY Right 04/20/2021   Procedure: RIGHT TOTAL MASTECTOMY;  Surgeon: Rolm Bookbinder, MD;  Location: [place];  Service: General;  Laterality: Right;    There were no vitals filed for this visit.   Subjective Assessment - 06/18/21 0940     Subjective I haven't felt the cording fora few days.    Pertinent History L breast cancer ER+ PR- Her2-; will complete neoadjuvant chemo then pt plans on having a double mastectomy,    Patient Stated Goals to return left shoulder to full funcion and  get rid of the cording in her left arm    Currently in Pain? No/denies                               [facility] Adult PT Treatment/Exercise - 06/18/21 0001       Exercises   Exercises Other Exercises    Other Exercises  Began instructing pt in Strength ABC program working thru squats with pt returning therapist demo throughout and tactile and VCs during for correct technique      Manual Therapy   Myofascial Release Briefly at axilla and medial upper arm but no cording palpable here today    Passive ROM in supine to Lt shoulder into flexion, abduction and D2 to pt's end ROM which is near full in all planes                          PT Long Term Goals - 06/10/21 1211       PT LONG TERM GOAL #1   Title Pt will not demonstrate any signs or symptoms of lymphedema and will return to baseline shoulder ROM measurements    Baseline Pt does not demonstrate any signs of lymphedema and her Rt shoulder has returned to baseline, her Lt is progressing well, see below - 06/10/21    Status Partially Met  PT LONG TERM GOAL #2   Title Pt will demonstrate 160 degrees of bilateral shoulder flexion to allow her to reach overhead.    Baseline R 130 L 105; Rt 165 and Lt 145 degrees - 06/10/21    Status Partially Met      PT LONG TERM GOAL #3   Title Pt will demonstrate 160 degrees of bilateral shoulder abduction to allow her to reach out to the sides.    Baseline R 100 L 73; Rt 166 and Lt 145 degrees - 06/10/21    Status Partially Met      PT LONG TERM GOAL #4   Title Pt will demonstrate 80 degrees of bilateral shoulder external rotation to allow her to return to PLOF.    Baseline R 66 L 55; Rt 90 and Lt 80 degrees - 06/10/21    Status Achieved      PT LONG TERM GOAL #5   Title Pt will be independent in a home exercise program for continued strengthening and stretching.    Baseline Pt is independent with initial HEP - 06/10/21    Status Partially Met                    Plan - 06/18/21 1006     Clinical Impression Statement Pt is making excellent progress. Cording was not noticeable by pt or therapist with stretching today so progressed to beginning instruction of Strength ABC program which pt did very well with. Cuing required for correct technique but then pt was able to return correct demo. She is excited to progress to exercises to work on getting her strength back.    Stability/Clinical Decision Making Stable/Uncomplicated    Rehab Potential Good    PT Frequency 2x / week    PT Duration 4 weeks    PT Treatment/Interventions ADLs/Self Care Home Management;Therapeutic exercise;Patient/family education;Manual lymph drainage;Scar mobilization;Orthotic Fit/Training;Passive range of motion;Manual techniques;Taping    PT Next Visit Plan Cont strength ABC program and review prn    PT Home Exercise Plan post op breast exercises, supine dowel flexion, supine scap series; Strength ABC program    Consulted and Agree with Plan of Care Patient             Patient will benefit from skilled therapeutic intervention in order to improve the following deficits and impairments:  Postural dysfunction, Decreased knowledge of precautions, Pain, Decreased scar mobility, Decreased strength, Increased edema, Impaired UE functional use, Decreased range of motion  Visit Diagnosis: Stiffness of right shoulder, not elsewhere classified  Stiffness of left shoulder, not elsewhere classified  Aftercare following surgery for neoplasm  Abnormal posture  Malignant neoplasm of upper-outer quadrant of left breast in female, estrogen receptor positive ([place])     Problem List Patient Active Problem List   Diagnosis Date Noted   Breast cancer, left breast ([place]) 04/20/2021   Port-A-Cath in place 12/29/2020   Genetic testing 10/05/2020   Malignant neoplasm of upper-outer quadrant of left breast in female, estrogen receptor positive ([place]) 09/25/2020   Family  history of breast cancer 09/25/2020    Otelia Limes, PTA 06/18/2021, 10:08 AM  [place] @ [place], [place], [address] Phone: [phone]   Fax:  [phone]  Name: Allison Dodson MRN: [id number] Date of Birth: 07/17/1995

## 2021-06-21 ENCOUNTER — Inpatient Hospital Stay: Payer: No Typology Code available for payment source

## 2021-06-30 ENCOUNTER — Encounter: Payer: No Typology Code available for payment source | Admitting: Physical Therapy

## 2021-07-05 ENCOUNTER — Ambulatory Visit: Payer: No Typology Code available for payment source

## 2021-07-05 DIAGNOSIS — R293 Abnormal posture: Secondary | ICD-10-CM

## 2021-07-05 DIAGNOSIS — M25611 Stiffness of right shoulder, not elsewhere classified: Secondary | ICD-10-CM | POA: Diagnosis not present

## 2021-07-05 DIAGNOSIS — Z170 Estrogen receptor positive status [ER+]: Secondary | ICD-10-CM

## 2021-07-05 DIAGNOSIS — M25612 Stiffness of left shoulder, not elsewhere classified: Secondary | ICD-10-CM

## 2021-07-05 DIAGNOSIS — Z483 Aftercare following surgery for neoplasm: Secondary | ICD-10-CM

## 2021-07-05 NOTE — Therapy (Signed)
[facility] @ [place], [place], [address] Phone: [phone]   Fax:  [phone]  Physical Therapy Treatment  Patient Details  Name: Allison Dodson MRN: [id number] Date of Birth: 15-Mar-1995 Referring Provider (PT): Lindi Adie   Encounter Date: 07/05/2021   PT End of Session - 07/05/21 0907     Visit Number 12    Number of Visits 18    Date for PT Re-Evaluation 07/08/21    PT Start Time 0903    PT Stop Time 1006    PT Time Calculation (min) 63 min    Activity Tolerance Patient tolerated treatment well    Behavior During Therapy [facility] for tasks assessed/performed             Past Medical History:  Diagnosis Date   Cancer ([place])    Family history of breast cancer 09/25/2020    Past Surgical History:  Procedure Laterality Date   MASTECTOMY W/ SENTINEL NODE BIOPSY Left 04/20/2021   Procedure: LEFT MASTECTOMY WITH LEFT AXILLARY SENTINEL LYMPH NODE BIOPSY;  Surgeon: Rolm Bookbinder, MD;  Location: [facility];  Service: General;  Laterality: Left;   PORT-A-CATH REMOVAL Right 04/20/2021   Procedure: REMOVAL PORT-A-CATH;  Surgeon: Rolm Bookbinder, MD;  Location: [facility];  Service: General;  Laterality: Right;   PORTACATH PLACEMENT N/A 11/02/2020   Procedure: INSERTION PORT-A-CATH;  Surgeon: Rolm Bookbinder, MD;  Location[facility];  Service: General;  Laterality: N/A;   TOTAL MASTECTOMY Right 04/20/2021   Procedure: RIGHT TOTAL MASTECTOMY;  Surgeon: Rolm Bookbinder, MD;  Location: [facility];  Service: General;  Laterality: Right;    There were no vitals filed for this visit.   Subjective Assessment - 07/05/21 0920     Subjective The cording seems to be gone. I feel like I'm doing really well.    Pertinent History L breast cancer ER+ PR- Her2-; will complete neoadjuvant chemo then pt plans on having a double mastectomy,    Patient Stated Goals to return left  shoulder to full funcion and get rid of the cording in her left arm    Currently in Pain? No/denies                [facility] PT Assessment - 07/05/21 0001       AROM   Right Shoulder Flexion 167 Degrees    Right Shoulder ABduction 177 Degrees    Right Shoulder External Rotation 90 Degrees    Left Shoulder Flexion 157 Degrees    Left Shoulder ABduction 160 Degrees    Left Shoulder External Rotation 90 Degrees                           [facility] Adult PT Treatment/Exercise - 07/05/21 0001       Exercises   Other Exercises  Continued with Strength ABC program resuming at one armed rows (2#) and completed instruction of packet with pt returning therapist demo for each      Manual Therapy   Manual Therapy Manual Lymphatic Drainage (MLD);Passive ROM    Manual Lymphatic Drainage (MLD) In supine: Lt inguinal nodes and Lt axillo-inguinal anastomosis beginning to instruct pt throughout    Passive ROM in supine to Lt shoulder into flexion, abduction and D2 to pt's end ROM which is near full in all planes  PT Long Term Goals - 07/05/21 1213       PT LONG TERM GOAL #1   Title Pt will not demonstrate any signs or symptoms of lymphedema and will return to baseline shoulder ROM measurements    Baseline Pt does not demonstrate any signs of lymphedema and her Rt shoulder has returned to baseline, her Lt is progressing well, see below - 06/10/21; Rt UE has met goals, Lt UE near, see below - 07/05/21    Status Partially Met      PT LONG TERM GOAL #2   Title Pt will demonstrate 160 degrees of bilateral shoulder flexion to allow her to reach overhead.    Baseline R 130 L 105; Rt 165 and Lt 145 degrees - 06/10/21; Rt 167 and Lt 157 - 07/05/21    Status Partially Met      PT LONG TERM GOAL #3   Title Pt will demonstrate 160 degrees of bilateral shoulder abduction to allow her to reach out to the sides.    Baseline R 100 L 73; Rt 166 and Lt 145  degrees - 06/10/21; Rt 177 and Lt 160 degrees - 07/05/21    Status Achieved      PT LONG TERM GOAL #4   Title Pt will demonstrate 80 degrees of bilateral shoulder external rotation to allow her to return to PLOF.    Baseline R 66 L 55; Rt 90 and Lt 80 degrees - 06/10/21; bil UE's 90 degrees - 07/05/21    Status Achieved      PT LONG TERM GOAL #5   Title Pt will be independent in a home exercise program for continued strengthening and stretching.    Baseline Pt is independent with initial HEP - 06/10/21; pt has just been instruted in Strength ABC program, will assess technique at final session - 07/05/21    Status Partially Met                   Plan - 07/05/21 4982     Clinical Impression Statement Completed instruction of Strength ABC program today. Pt was able to return good technique for each exercise. Her cording has seemed to resolve but did instruct her that if she returns to activity too soon it could return and to resume stretching if this happens. Then continued with end P/ROM stretching to Lt shoulder. She is still limited at end motions due to tightness and began to instruct her in MLD to Lt axillo-inguinal anastomosis. Pt will be ready for D/C after next session.    Stability/Clinical Decision Making Stable/Uncomplicated    Rehab Potential Good    PT Frequency 2x / week    PT Duration 4 weeks    PT Treatment/Interventions ADLs/Self Care Home Management;Therapeutic exercise;Patient/family education;Manual lymph drainage;Scar mobilization;Orthotic Fit/Training;Passive range of motion;Manual techniques;Taping    PT Next Visit Plan D/C next; See if any questions from Strength ABC and cont end Lt shoulder P/ROM and instruct in self MLD to Lt axillo-inguinal anastomosis    PT Home Exercise Plan post op breast exercises, supine dowel flexion, supine scap series; Strength ABC program    Consulted and Agree with Plan of Care Patient             Patient will benefit from skilled  therapeutic intervention in order to improve the following deficits and impairments:  Postural dysfunction, Decreased knowledge of precautions, Pain, Decreased scar mobility, Decreased strength, Increased edema, Impaired UE functional use, Decreased range of motion  Visit Diagnosis:  Stiffness of right shoulder, not elsewhere classified  Stiffness of left shoulder, not elsewhere classified  Aftercare following surgery for neoplasm  Abnormal posture  Malignant neoplasm of upper-outer quadrant of left breast in female, estrogen receptor positive [facility])     Problem List Patient Active Problem List   Diagnosis Date Noted   Breast cancer, left breast ([place]) 04/20/2021   Port-A-Cath in place 12/29/2020   Genetic testing 10/05/2020   Malignant neoplasm of upper-outer quadrant of left breast in female, estrogen receptor positive ([place]) 09/25/2020   Family history of breast cancer 09/25/2020    Otelia Limes, PTA 07/05/2021, 12:15 PM  [facility] @ [place], [place], [address] Phone: [phone]   Fax:  [phone]  Name: Allison Dodson MRN: [id number] Date of Birth: 1995/12/08

## 2021-07-12 ENCOUNTER — Encounter: Payer: Self-pay | Admitting: Physical Therapy

## 2021-07-12 ENCOUNTER — Ambulatory Visit: Payer: No Typology Code available for payment source | Admitting: Physical Therapy

## 2021-07-12 DIAGNOSIS — C50412 Malignant neoplasm of upper-outer quadrant of left female breast: Secondary | ICD-10-CM

## 2021-07-12 DIAGNOSIS — M25611 Stiffness of right shoulder, not elsewhere classified: Secondary | ICD-10-CM | POA: Diagnosis not present

## 2021-07-12 DIAGNOSIS — Z483 Aftercare following surgery for neoplasm: Secondary | ICD-10-CM

## 2021-07-12 DIAGNOSIS — R293 Abnormal posture: Secondary | ICD-10-CM

## 2021-07-12 DIAGNOSIS — M25612 Stiffness of left shoulder, not elsewhere classified: Secondary | ICD-10-CM

## 2021-07-15 ENCOUNTER — Encounter: Payer: Self-pay | Admitting: *Deleted

## 2021-07-19 ENCOUNTER — Ambulatory Visit: Payer: No Typology Code available for payment source

## 2021-07-19 ENCOUNTER — Ambulatory Visit: Payer: No Typology Code available for payment source | Admitting: Physical Therapy

## 2021-07-21 ENCOUNTER — Inpatient Hospital Stay: Payer: No Typology Code available for payment source | Attending: Hematology and Oncology | Admitting: Adult Health

## 2021-07-21 ENCOUNTER — Other Ambulatory Visit: Payer: Self-pay

## 2021-07-21 ENCOUNTER — Encounter: Payer: Self-pay | Admitting: Adult Health

## 2021-07-21 ENCOUNTER — Inpatient Hospital Stay: Payer: No Typology Code available for payment source

## 2021-07-21 VITALS — BP 137/75 | HR 83 | Temp 97.3°F | Resp 18 | Ht 63.0 in | Wt 232.4 lb

## 2021-07-21 DIAGNOSIS — C50412 Malignant neoplasm of upper-outer quadrant of left female breast: Secondary | ICD-10-CM | POA: Diagnosis present

## 2021-07-21 DIAGNOSIS — Z5111 Encounter for antineoplastic chemotherapy: Secondary | ICD-10-CM | POA: Insufficient documentation

## 2021-07-21 DIAGNOSIS — Z170 Estrogen receptor positive status [ER+]: Secondary | ICD-10-CM | POA: Insufficient documentation

## 2021-07-21 MED ORDER — GOSERELIN ACETATE 3.6 MG ~~LOC~~ IMPL
3.6000 mg | DRUG_IMPLANT | Freq: Once | SUBCUTANEOUS | Status: AC
  Administered 2021-07-21: 3.6 mg via SUBCUTANEOUS
  Filled 2021-07-21: qty 3.6

## 2021-07-21 NOTE — Progress Notes (Signed)
SURVIVORSHIP VISIT:    BRIEF ONCOLOGIC HISTORY:  Oncology History  Malignant neoplasm of upper-outer quadrant of left breast in female, estrogen receptor positive ([place])  09/25/2020 Initial Diagnosis   Palpable lump in the left breast.  Mammogram revealed 3 cm tumor and a possible 5 mm satellite nodule.  Biopsy revealed grade 3 IDC ER 40% weak, PR 0%, HER2 negative, Ki-67 60%   09/25/2020 Cancer Staging   Staging form: Breast, AJCC 8th Edition - Clinical stage from 09/25/2020: Stage IIB (cT2, cN0, cM0, G3, ER+, PR-, HER2-) - Signed by Nicholas Lose, MD on 09/25/2020 Stage prefix: Initial diagnosis Histologic grading system: 3 grade system   10/02/2020 Genetic Testing   Negative hereditary cancer genetic testing: no pathogenic variants detected in [facility] BRCAPlus Panel and [facility] CancerNext-Expanded +RNAinsight Panel.  The report dates are October 02, 2020 and October 13, 2020, respectively.   The BRCAplus panel offered by [employer] and includes sequencing and deletion/duplication analysis for the following 8 genes: ATM, BRCA1, BRCA2, CDH1, CHEK2, PALB2, PTEN, and TP53.  The CancerNext-Expanded gene panel offered by [facility] and includes sequencing, rearrangement, and RNA analysis for the following 77 genes: AIP, ALK, APC, ATM, AXIN2, BAP1, BARD1, BLM, BMPR1A, BRCA1, BRCA2, BRIP1, CDC73, CDH1, CDK4, CDKN1B, CDKN2A, CHEK2, CTNNA1, DICER1, FANCC, FH, FLCN, GALNT12, KIF1B, LZTR1, MAX, MEN1, MET, MLH1, MSH2, MSH3, MSH6, MUTYH, NBN, NF1, NF2, NTHL1, PALB2, PHOX2B, PMS2, POT1, PRKAR1A, PTCH1, PTEN, RAD51C, RAD51D, RB1, RECQL, RET, SDHA, SDHAF2, SDHB, SDHC, SDHD, SMAD4, SMARCA4, SMARCB1, SMARCE1, STK11, SUFU, TMEM127, TP53, TSC1, TSC2, VHL and XRCC2 (sequencing and deletion/duplication); EGFR, EGLN1, HOXB13, KIT, MITF, PDGFRA, POLD1, and POLE (sequencing only); EPCAM and GREM1 (deletion/duplication only).    11/03/2020 -  Chemotherapy   Patient is on Treatment Plan : BREAST ADJUVANT DOSE DENSE AC  q14d / PACLitaxel switched to Abraxane (due to rxn) q7d      04/20/2021 Surgery   Right mastectomy: Negative for cancer Left mastectomy: High-grade 1.4 cm IDC with clear margins, 0/7 lymph nodes negative Biomarkers on the initial biopsy: ER 40% weak, PR 0%, HER2 1+ negative, Ki-67 60%    05/17/2021 -  Anti-estrogen oral therapy   Anastrozole     INTERVAL HISTORY:  Allison Dodson to review her survivorship care plan detailing her treatment course for breast cancer, as well as monitoring long-term side effects of that treatment, education regarding health maintenance, screening, and overall wellness and health promotion.     Overall, Allison Dodson reports feeling quite well today.  She is experiencing hot flashes with the anastrozole and Zoladex but otherwise is tolerating the medication without difficulty.  She denies any residual issues such as peripheral neuropathy from receiving the chemotherapy.  REVIEW OF SYSTEMS:  Review of Systems  Constitutional:  Negative for appetite change, chills, fatigue, fever and unexpected weight change.  HENT:   Negative for hearing loss, lump/mass and trouble swallowing.   Eyes:  Negative for eye problems and icterus.  Respiratory:  Negative for chest tightness, cough and shortness of breath.   Cardiovascular:  Negative for chest pain, leg swelling and palpitations.  Gastrointestinal:  Negative for abdominal distention, abdominal pain, constipation, diarrhea, nausea and vomiting.  Endocrine: Positive for hot flashes.  Genitourinary:  Negative for difficulty urinating.   Musculoskeletal:  Negative for arthralgias.  Skin:  Negative for itching and rash.  Neurological:  Negative for dizziness, extremity weakness, headaches and numbness.  Hematological:  Negative for adenopathy. Does not bruise/bleed easily.  Psychiatric/Behavioral:  Negative for depression. The patient is not  nervous/anxious.    Breast: Denies any new nodularity, masses, tenderness, nipple changes,  or nipple discharge.      ONCOLOGY TREATMENT TEAM:  1. Surgeon:  Dr. Donne Hazel at [facility] 2. Medical Oncologist: Dr. Lindi Adie      PAST MEDICAL/SURGICAL HISTORY:  Past Medical History:  Diagnosis Date   Cancer [facility])    Family history of breast cancer 09/25/2020   Past Surgical History:  Procedure Laterality Date   MASTECTOMY W/ SENTINEL NODE BIOPSY Left 04/20/2021   Procedure: LEFT MASTECTOMY WITH LEFT AXILLARY SENTINEL LYMPH NODE BIOPSY;  Surgeon: Rolm Bookbinder, MD;  Location: [facility];  Service: General;  Laterality: Left;   PORT-A-CATH REMOVAL Right 04/20/2021   Procedure: REMOVAL PORT-A-CATH;  Surgeon: Rolm Bookbinder, MD;  Location: [facility];  Service: General;  Laterality: Right;   PORTACATH PLACEMENT N/A 11/02/2020   Procedure: INSERTION PORT-A-CATH;  Surgeon: Rolm Bookbinder, MD;  Location[facility];  Service: General;  Laterality: N/A;   TOTAL MASTECTOMY Right 04/20/2021   Procedure: RIGHT TOTAL MASTECTOMY;  Surgeon: Rolm Bookbinder, MD;  Location: [facility];  Service: General;  Laterality: Right;     ALLERGIES:  Allergies  Allergen Reactions   Paclitaxel Shortness Of Breath, Palpitations and Other (See Comments)    flushing     CURRENT MEDICATIONS:  Outpatient Encounter Medications as of 07/21/2021  Medication Sig   ALPRAZolam (XANAX) 0.25 MG tablet Take by mouth.   anastrozole (ARIMIDEX) 1 MG tablet Take 1 tablet (1 mg total) by mouth daily.   No facility-administered encounter medications on file as of 07/21/2021.     ONCOLOGIC FAMILY HISTORY:  Family History  Problem Relation Age of Onset   Liver cancer Maternal Aunt 55   Breast cancer Maternal Aunt 59   Breast cancer Maternal Grandmother 76   Lung cancer Paternal Grandfather 68       smoking hx   Breast cancer Other        MGM's sister & nieces   Breast cancer Cousin 4       triple negative     GENETIC  COUNSELING/TESTING: Negative  SOCIAL HISTORY:  Social History   Socioeconomic History   Marital status: Married    Spouse name: Not on file   Number of children: Not on file   Years of education: Not on file   Highest education level: Not on file  Occupational History   Not on file  Tobacco Use   Smoking status: Never   Smokeless tobacco: Never  Vaping Use   Vaping Use: Never used  Substance and Sexual Activity   Alcohol use: Not Currently    Comment: soc.   Drug use: Never   Sexual activity: Yes    Partners: Male    Birth control/protection: Condom  Other Topics Concern   Not on file  Social History Narrative   Not on file   Social Determinants of Health   Financial Resource Strain: Not on file  Food Insecurity: Not on file  Transportation Needs: Not on file  Physical Activity: Not on file  Stress: Not on file  Social Connections: Not on file  Intimate Partner Violence: Not on file     OBSERVATIONS/OBJECTIVE:  BP 137/75 (BP Location: Left Arm, Patient Position: Sitting)   Pulse 83   Temp (!) 97.3 F (36.3 C) (Temporal)   Resp 18   Ht _0  (1.6 m)   Wt 232 lb 6.4 oz (105.4 kg)   SpO2 100%  BMI 41.17 kg/m  GENERAL: Patient is a well appearing female in no acute distress HEENT:  Sclerae anicteric.  Oropharynx clear and moist. No ulcerations or evidence of oropharyngeal candidiasis. Neck is supple.  NODES:  No cervical, supraclavicular, or axillary lymphadenopathy palpated.  BREAST EXAM: Status post bilateral mastectomies without reconstruction.  No sign of local recurrence bilaterally. LUNGS:  Clear to auscultation bilaterally.  No wheezes or rhonchi. HEART:  Regular rate and rhythm. No murmur appreciated. ABDOMEN:  Soft, nontender.  Positive, normoactive bowel sounds. No organomegaly palpated. MSK:  No focal spinal tenderness to palpation. Full range of motion bilaterally in the upper extremities. EXTREMITIES:  No peripheral edema.   SKIN:  Clear with  no obvious rashes or skin changes. No nail dyscrasia. NEURO:  Nonfocal. Well oriented.  Appropriate affect.   LABORATORY DATA:  None for this visit.  DIAGNOSTIC IMAGING:  None for this visit.      ASSESSMENT AND PLAN:  Ms.. Allison Dodson is a pleasant 26 y.o. female with Stage IIB left breast invasive ductal carcinoma, ER+/PR+-HER2-, diagnosed in 09/2020, treated with neoadjuvant chemotherapy, bilateral mastectomies, and anti-estrogen therapy with Anastrozole/Zoladex beginning in 03/2021.  She presents to the Survivorship Clinic for our initial meeting and routine follow-up post-completion of treatment for breast cancer.    1. Stage IIB left breast cancer:  Allison Dodson is continuing to recover from definitive treatment for breast cancer. She will follow-up with her medical oncologist, Dr. Lindi Adie in 6 months with history and physical exam per surveillance protocol.  She will continue her anti-estrogen therapy with Anastrozole/Zoladex. Thus far, she is tolerating the treatment well, with minimal side effects. She was instructed to make Dr. Lindi Adie or myself aware if she begins to experience any worsening side effects of the medication and I could see her back in clinic to help manage those side effects, as needed.  Mammograms are no longer indicated since she has had bilateral mastectomies.  We did discuss the possibility of [employer] testing which is circulating tumor DNA.  I reviewed with her that Dr. Lindi Adie would discuss this with her in more detail at her next appointment with him in January 2024.  Today, a comprehensive survivorship care plan and treatment summary was reviewed with the patient today detailing her breast cancer diagnosis, treatment course, potential late/long-term effects of treatment, appropriate follow-up care with recommendations for the future, and patient education resources.  A copy of this summary, along with a letter will be sent to the patient's primary care provider via mail/fax/In  Basket message after today's visit.    2. Bone health:  Given Allison Dodson's age/history of breast cancer and her current treatment regimen including anti-estrogen therapy with anastrozole, she is at risk for bone demineralization.  She has not undergone any bone density testing therefore I ordered a baseline test for her to have completed in the next 1 to 2 weeks today.  She was given education on specific activities to promote bone health.  3. Cancer screening:  Due to Allison Dodson's history and her age, she should receive screening for skin cancers and gynecologic cancers.  The information and recommendations are listed on the patient's comprehensive care plan/treatment summary and were reviewed in detail with the patient.    4. Health maintenance and wellness promotion: Allison Dodson was encouraged to consume 5-7 servings of fruits and vegetables per day. We reviewed the "Nutrition Rainbow" handout.  She was also encouraged to engage in moderate to vigorous exercise for 30 minutes per day most  days of the week. We discussed the [employer] fitness program, which is designed for cancer survivors to help them become more physically fit after cancer treatments.  She was instructed to limit her alcohol consumption and continue to abstain from tobacco use.     5. Support services/counseling: It is not uncommon for this period of the patient's cancer care trajectory to be one of many emotions and stressors.  We discussed how this can be increasingly difficult during the times of quarantine and social distancing due to the COVID-19 pandemic.   She was given information regarding our available services and encouraged to contact me with any questions or for help enrolling in any of our support group/programs.    Follow up instructions:    -Return to cancer center in 6 months for f/u -Zoladex injections every 4 weeks -Bone density testing ordered -Follow up with surgery in 1 year -She is welcome to return back to  the Survivorship Clinic at any time; no additional follow-up needed at this time.  -Consider referral back to survivorship as a long-term survivor for continued surveillance  The patient was provided an opportunity to ask questions and all were answered. The patient agreed with the plan and demonstrated an understanding of the instructions.   Total encounter time:45 minutes*in face-to-face visit time, chart review, lab review, care coordination, order entry, and documentation of the encounter time.    Wilber Bihari, NP 07/21/21 9:01 AM Medical Oncology and Hematology [facility] [place],  [address] Tel. [phone]    Fax. [phone]  *Total Encounter Time as defined by the Centers for Medicare and Medicaid Services includes, in addition to the face-to-face time of a patient visit (documented in the note above) non-face-to-face time: obtaining and reviewing outside history, ordering and reviewing medications, tests or procedures, care coordination (communications with other health care professionals or caregivers) and documentation in the medical record.

## 2021-07-22 ENCOUNTER — Ambulatory Visit (HOSPITAL_BASED_OUTPATIENT_CLINIC_OR_DEPARTMENT_OTHER)
Admission: RE | Admit: 2021-07-22 | Discharge: 2021-07-22 | Disposition: A | Discharge status: Home / Self Care | Payer: No Typology Code available for payment source | Source: Ambulatory Visit | Attending: Adult Health | Admitting: Adult Health

## 2021-07-22 DIAGNOSIS — C50412 Malignant neoplasm of upper-outer quadrant of left female breast: Secondary | ICD-10-CM | POA: Diagnosis present

## 2021-07-23 ENCOUNTER — Encounter: Payer: Self-pay | Admitting: Adult Health

## 2021-07-23 ENCOUNTER — Other Ambulatory Visit: Payer: Self-pay | Admitting: Adult Health

## 2021-07-23 DIAGNOSIS — M816 Localized osteoporosis [Lequesne]: Secondary | ICD-10-CM

## 2021-07-23 DIAGNOSIS — Z170 Estrogen receptor positive status [ER+]: Secondary | ICD-10-CM

## 2021-07-23 NOTE — Progress Notes (Signed)
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  I reviewed this with Dr. Lindi Adie, as she has not been on Goserelin or Anastrozole long enough to create the osteoporosis.  I placed orders for a bone scan to r/o metastatic breast cancer and a referral to endocrinology.    I left a message for Allison Dodson to return my call.  Will send my chart message/result message as well.    Wilber Bihari, NP 07/23/21 11:03 AM Medical Oncology and Hematology [facility] [place], [place] [address] Tel. [phone]    Fax. [phone]

## 2021-07-26 ENCOUNTER — Telehealth: Payer: Self-pay

## 2021-07-26 NOTE — Telephone Encounter (Signed)
Spoke with pt in regards to [employer] from Wilber Bihari, NP. Pt is scheduled for [place] Bone Scan Monday, July 17 at [place] check in for injection then 1045 return for scan. Pt is aware and verbalized thanks.

## 2021-07-27 ENCOUNTER — Encounter: Payer: Self-pay | Admitting: Hematology and Oncology

## 2021-07-29 ENCOUNTER — Other Ambulatory Visit: Payer: Self-pay

## 2021-07-29 ENCOUNTER — Encounter: Payer: Self-pay | Admitting: Surgery

## 2021-07-29 DIAGNOSIS — Z170 Estrogen receptor positive status [ER+]: Secondary | ICD-10-CM

## 2021-07-29 NOTE — Progress Notes (Signed)
Called [facility], [place] location, new pt appt as soon as September.  Faxed referral to them for pt to be seen.   Phone [phone] Fax [phone]

## 2021-07-29 NOTE — Progress Notes (Signed)
Per Wilber Bihari, NP, I called [facility] to see if I could get the pt an appointment due to osteoporosis.  Per [facility], their office has had trouble with their computer system and they asked if the referral could be faxed to their office.  I notified Chanler [place], LPN, who is going to fax the referral to their office to the attention of Dr. Buddy Duty.

## 2021-08-02 ENCOUNTER — Ambulatory Visit (HOSPITAL_COMMUNITY)
Admission: RE | Admit: 2021-08-02 | Discharge: 2021-08-02 | Disposition: A | Discharge status: Home / Self Care | Payer: No Typology Code available for payment source | Source: Ambulatory Visit | Attending: Adult Health | Admitting: Adult Health

## 2021-08-02 DIAGNOSIS — M816 Localized osteoporosis [Lequesne]: Secondary | ICD-10-CM | POA: Diagnosis present

## 2021-08-02 DIAGNOSIS — Z170 Estrogen receptor positive status [ER+]: Secondary | ICD-10-CM

## 2021-08-02 DIAGNOSIS — C50412 Malignant neoplasm of upper-outer quadrant of left female breast: Secondary | ICD-10-CM | POA: Diagnosis present

## 2021-08-02 MED ORDER — TECHNETIUM TC 99M MEDRONATE IV KIT
20.0000 | PACK | Freq: Once | INTRAVENOUS | Status: AC | PRN
  Administered 2021-08-02: 20 via INTRAVENOUS

## 2021-08-03 ENCOUNTER — Telehealth: Payer: Self-pay | Admitting: *Deleted

## 2021-08-03 NOTE — Telephone Encounter (Addendum)
Called pt and informed her. ----- Message from Gardenia Phlegm, NP sent at 08/03/2021  7:37 AM EDT ----- Allison Dodson news, no bone metastases!  Please let patient know.  ----- Message ----- From: Interface, Rad Results In Sent: 08/02/2021   9:54 PM EDT To: Gardenia Phlegm, NP

## 2021-08-10 ENCOUNTER — Encounter: Payer: Self-pay | Admitting: Hematology and Oncology

## 2021-08-17 ENCOUNTER — Other Ambulatory Visit: Payer: Self-pay

## 2021-08-17 ENCOUNTER — Inpatient Hospital Stay: Payer: No Typology Code available for payment source | Attending: Hematology and Oncology

## 2021-08-17 VITALS — BP 137/78 | HR 92 | Temp 98.4°F | Resp 20

## 2021-08-17 DIAGNOSIS — Z5111 Encounter for antineoplastic chemotherapy: Secondary | ICD-10-CM | POA: Insufficient documentation

## 2021-08-17 DIAGNOSIS — C50412 Malignant neoplasm of upper-outer quadrant of left female breast: Secondary | ICD-10-CM | POA: Insufficient documentation

## 2021-08-17 DIAGNOSIS — Z170 Estrogen receptor positive status [ER+]: Secondary | ICD-10-CM | POA: Diagnosis not present

## 2021-08-17 MED ORDER — GOSERELIN ACETATE 3.6 MG ~~LOC~~ IMPL
3.6000 mg | DRUG_IMPLANT | Freq: Once | SUBCUTANEOUS | Status: AC
  Administered 2021-08-17: 3.6 mg via SUBCUTANEOUS
  Filled 2021-08-17: qty 3.6

## 2021-09-14 ENCOUNTER — Other Ambulatory Visit: Payer: Self-pay

## 2021-09-14 ENCOUNTER — Inpatient Hospital Stay: Payer: No Typology Code available for payment source

## 2021-09-14 VITALS — BP 125/82 | HR 99 | Temp 98.3°F | Resp 20

## 2021-09-14 DIAGNOSIS — Z170 Estrogen receptor positive status [ER+]: Secondary | ICD-10-CM

## 2021-09-14 DIAGNOSIS — Z5111 Encounter for antineoplastic chemotherapy: Secondary | ICD-10-CM | POA: Diagnosis not present

## 2021-09-14 MED ORDER — GOSERELIN ACETATE 3.6 MG ~~LOC~~ IMPL
3.6000 mg | DRUG_IMPLANT | Freq: Once | SUBCUTANEOUS | Status: AC
  Administered 2021-09-14: 3.6 mg via SUBCUTANEOUS
  Filled 2021-09-14: qty 3.6

## 2021-10-12 ENCOUNTER — Other Ambulatory Visit: Payer: Self-pay

## 2021-10-12 ENCOUNTER — Inpatient Hospital Stay: Payer: No Typology Code available for payment source | Attending: Hematology and Oncology

## 2021-10-12 VITALS — BP 129/77 | HR 89 | Temp 98.2°F | Resp 20

## 2021-10-12 DIAGNOSIS — Z5111 Encounter for antineoplastic chemotherapy: Secondary | ICD-10-CM | POA: Insufficient documentation

## 2021-10-12 DIAGNOSIS — C50412 Malignant neoplasm of upper-outer quadrant of left female breast: Secondary | ICD-10-CM | POA: Diagnosis present

## 2021-10-12 DIAGNOSIS — Z170 Estrogen receptor positive status [ER+]: Secondary | ICD-10-CM | POA: Diagnosis not present

## 2021-10-12 MED ORDER — GOSERELIN ACETATE 3.6 MG ~~LOC~~ IMPL
3.6000 mg | DRUG_IMPLANT | Freq: Once | SUBCUTANEOUS | Status: AC
  Administered 2021-10-12: 3.6 mg via SUBCUTANEOUS
  Filled 2021-10-12: qty 3.6

## 2021-11-08 ENCOUNTER — Ambulatory Visit: Payer: No Typology Code available for payment source | Attending: Hematology and Oncology

## 2021-11-08 VITALS — Wt 232.5 lb

## 2021-11-08 DIAGNOSIS — Z483 Aftercare following surgery for neoplasm: Secondary | ICD-10-CM | POA: Insufficient documentation

## 2021-11-08 NOTE — Therapy (Signed)
  OUTPATIENT PHYSICAL THERAPY SOZO SCREENING NOTE   Patient Name: Allison Dodson MRN: [id number] DOB:July 01, 1995, 26 y.o., female Today's Date: 11/08/2021  PCP: Kerry Dory, NP REFERRING PROVIDER: Kerry Dory, NP   PT End of Session - 11/08/21 1701     Visit Number 13   # unchanged due to screen only   PT Start Time 0947    PT Stop Time 1703    PT Time Calculation (min) 5 min    Activity Tolerance Patient tolerated treatment well             Past Medical History:  Diagnosis Date   Cancer ([place])    Family history of breast cancer 09/25/2020   Port-A-Cath in place 12/29/2020   Past Surgical History:  Procedure Laterality Date   MASTECTOMY W/ SENTINEL NODE BIOPSY Left 04/20/2021   Procedure: LEFT MASTECTOMY WITH LEFT AXILLARY SENTINEL LYMPH NODE BIOPSY;  Surgeon: Rolm Bookbinder, MD;  Location: [place];  Service: General;  Laterality: Left;   PORT-A-CATH REMOVAL Right 04/20/2021   Procedure: REMOVAL PORT-A-CATH;  Surgeon: Rolm Bookbinder, MD;  Location: [place];  Service: General;  Laterality: Right;   PORTACATH PLACEMENT N/A 11/02/2020   Procedure: INSERTION PORT-A-CATH;  Surgeon: Rolm Bookbinder, MD;  Location: [facility];  Service: General;  Laterality: N/A;   TOTAL MASTECTOMY Right 04/20/2021   Procedure: RIGHT TOTAL MASTECTOMY;  Surgeon: Rolm Bookbinder, MD;  Location: [place];  Service: General;  Laterality: Right;   Patient Active Problem List   Diagnosis Date Noted   Genetic testing 10/05/2020   Malignant neoplasm of upper-outer quadrant of left breast in female, estrogen receptor positive ([place]) 09/25/2020   Family history of breast cancer 09/25/2020    REFERRING DIAG: left breast cancer at risk for lymphedema  THERAPY DIAG:  Aftercare following surgery for neoplasm  PERTINENT HISTORY: L breast cancer ER+ PR- Her2-; will complete neoadjuvant chemo then pt plans on having a double  mastectomy  PRECAUTIONS: left UE Lymphedema risk, None  SUBJECTIVE: Pt returns for her 3 month L-Dex screen.   PAIN:  Are you having pain? No  SOZO SCREENING: Patient was assessed today using the SOZO machine to determine the lymphedema index score. This was compared to her baseline score. It was determined that she is within the recommended range when compared to her baseline and no further action is needed at this time. She will continue SOZO screenings. These are done every 3 months for 2 years post operatively followed by every 6 months for 2 years, and then annually.   L-DEX FLOWSHEETS - 11/08/21 1700       L-DEX LYMPHEDEMA SCREENING   Measurement Type Unilateral    L-DEX MEASUREMENT EXTREMITY Upper Extremity    POSITION  Standing    DOMINANT SIDE Right    At Risk Side Left    BASELINE SCORE (UNILATERAL) -3.2    L-DEX SCORE (UNILATERAL) -4.4    VALUE CHANGE (UNILAT) -1.2              Otelia Limes, PTA 11/08/2021, 5:07 PM

## 2021-11-09 ENCOUNTER — Other Ambulatory Visit: Payer: Self-pay

## 2021-11-09 ENCOUNTER — Inpatient Hospital Stay: Payer: No Typology Code available for payment source | Attending: Hematology and Oncology

## 2021-11-09 VITALS — BP 140/84 | HR 95 | Temp 98.5°F | Resp 16

## 2021-11-09 DIAGNOSIS — Z5111 Encounter for antineoplastic chemotherapy: Secondary | ICD-10-CM | POA: Diagnosis present

## 2021-11-09 DIAGNOSIS — Z170 Estrogen receptor positive status [ER+]: Secondary | ICD-10-CM | POA: Insufficient documentation

## 2021-11-09 DIAGNOSIS — C50412 Malignant neoplasm of upper-outer quadrant of left female breast: Secondary | ICD-10-CM | POA: Diagnosis present

## 2021-11-09 MED ORDER — GOSERELIN ACETATE 3.6 MG ~~LOC~~ IMPL
3.6000 mg | DRUG_IMPLANT | Freq: Once | SUBCUTANEOUS | Status: AC
  Administered 2021-11-09: 3.6 mg via SUBCUTANEOUS
  Filled 2021-11-09: qty 3.6

## 2021-12-07 ENCOUNTER — Inpatient Hospital Stay: Payer: No Typology Code available for payment source | Attending: Hematology and Oncology

## 2021-12-07 ENCOUNTER — Other Ambulatory Visit: Payer: Self-pay

## 2021-12-07 VITALS — BP 121/69 | HR 87 | Temp 98.7°F | Resp 18

## 2021-12-07 DIAGNOSIS — C50412 Malignant neoplasm of upper-outer quadrant of left female breast: Secondary | ICD-10-CM | POA: Insufficient documentation

## 2021-12-07 DIAGNOSIS — Z5111 Encounter for antineoplastic chemotherapy: Secondary | ICD-10-CM | POA: Insufficient documentation

## 2021-12-07 DIAGNOSIS — Z170 Estrogen receptor positive status [ER+]: Secondary | ICD-10-CM | POA: Diagnosis not present

## 2021-12-07 MED ORDER — GOSERELIN ACETATE 3.6 MG ~~LOC~~ IMPL
3.6000 mg | DRUG_IMPLANT | Freq: Once | SUBCUTANEOUS | Status: AC
  Administered 2021-12-07: 3.6 mg via SUBCUTANEOUS
  Filled 2021-12-07: qty 3.6

## 2022-01-04 ENCOUNTER — Inpatient Hospital Stay: Payer: No Typology Code available for payment source | Attending: Hematology and Oncology

## 2022-01-04 ENCOUNTER — Other Ambulatory Visit: Payer: Self-pay

## 2022-01-04 VITALS — BP 136/76 | HR 86 | Temp 98.2°F | Resp 16

## 2022-01-04 DIAGNOSIS — Z170 Estrogen receptor positive status [ER+]: Secondary | ICD-10-CM | POA: Diagnosis not present

## 2022-01-04 DIAGNOSIS — Z5111 Encounter for antineoplastic chemotherapy: Secondary | ICD-10-CM | POA: Diagnosis present

## 2022-01-04 DIAGNOSIS — C50412 Malignant neoplasm of upper-outer quadrant of left female breast: Secondary | ICD-10-CM | POA: Diagnosis present

## 2022-01-04 MED ORDER — GOSERELIN ACETATE 3.6 MG ~~LOC~~ IMPL
3.6000 mg | DRUG_IMPLANT | Freq: Once | SUBCUTANEOUS | Status: AC
  Administered 2022-01-04: 3.6 mg via SUBCUTANEOUS
  Filled 2022-01-04: qty 3.6

## 2022-02-01 ENCOUNTER — Inpatient Hospital Stay
Payer: No Typology Code available for payment source | Attending: Hematology and Oncology | Admitting: Hematology and Oncology

## 2022-02-01 ENCOUNTER — Other Ambulatory Visit: Payer: Self-pay

## 2022-02-01 ENCOUNTER — Inpatient Hospital Stay: Payer: No Typology Code available for payment source

## 2022-02-01 VITALS — BP 146/76 | HR 116 | Temp 97.5°F | Resp 17 | Wt 234.0 lb

## 2022-02-01 DIAGNOSIS — Z5111 Encounter for antineoplastic chemotherapy: Secondary | ICD-10-CM | POA: Diagnosis not present

## 2022-02-01 DIAGNOSIS — Z170 Estrogen receptor positive status [ER+]: Secondary | ICD-10-CM | POA: Diagnosis not present

## 2022-02-01 DIAGNOSIS — C50412 Malignant neoplasm of upper-outer quadrant of left female breast: Secondary | ICD-10-CM | POA: Diagnosis not present

## 2022-02-01 MED ORDER — ESTRADIOL 0.1 MG/GM VA CREA: 1.0000 | TOPICAL_CREAM | Freq: Every day | VAGINAL | 12 refills | Status: DC

## 2022-02-01 MED ORDER — GOSERELIN ACETATE 3.6 MG ~~LOC~~ IMPL
3.6000 mg | DRUG_IMPLANT | Freq: Once | SUBCUTANEOUS | Status: AC
  Administered 2022-02-01: 3.6 mg via SUBCUTANEOUS
  Filled 2022-02-01: qty 3.6

## 2022-02-01 NOTE — Patient Instructions (Signed)
Goserelin Implant What is this medication? GOSERELIN (GOE se rel in) treats prostate cancer and breast cancer. It works by decreasing levels of the hormones testosterone and estrogen in the body. This prevents prostate and breast cancer cells from spreading or growing. It may also be used to treat endometriosis. This is a condition where the tissue that lines the uterus grows outside the uterus. It works by decreasing the amount of estrogen your body makes, which reduces heavy bleeding and pain. It can also be used to help thin the lining of the uterus before a surgery used to prevent or reduce heavy periods. This medicine may be used for other purposes; ask your health care provider or pharmacist if you have questions. COMMON BRAND NAME(S): Zoladex, Zoladex 3-Month What should I tell my care team before I take this medication? They need to know if you have any of these conditions: Bone problems Diabetes Heart disease History of irregular heartbeat or rhythm An unusual or allergic reaction to goserelin, other medications, foods, dyes, or preservatives Pregnant or trying to get pregnant Breastfeeding How should I use this medication? This medication is injected under the skin. It is given by your care team in a hospital or clinic setting. Talk to your care team about the use of this medication in children. Special care may be needed. Overdosage: If you think you have taken too much of this medicine contact a poison control center or emergency room at once. NOTE: This medicine is only for you. Do not share this medicine with others. What if I miss a dose? Keep appointments for follow-up doses. It is important not to miss your dose. Call your care team if you are unable to keep an appointment. What may interact with this medication? Do not take this medication with any of the following: Cisapride Dronedarone Pimozide Thioridazine This medication may also interact with the following: Other  medications that cause heart rhythm changes This list may not describe all possible interactions. Give your health care provider a list of all the medicines, herbs, non-prescription drugs, or dietary supplements you use. Also tell them if you smoke, drink alcohol, or use illegal drugs. Some items may interact with your medicine. What should I watch for while using this medication? Visit your care team for regular checks on your progress. Your symptoms may appear to get worse during the first weeks of this therapy. Tell your care team if your symptoms do not start to get better or if they get worse after this time. Using this medication for a long time may weaken your bones. If you smoke or frequently drink alcohol you may increase your risk of bone loss. A family history of osteoporosis, chronic use of medications for seizures (convulsions), or corticosteroids can also increase your risk of bone loss. The risk of bone fractures may be increased. Talk to your care team about your bone health. This medication may increase blood sugar. The risk may be higher in patients who already have diabetes. Ask your care team what you can do to lower your risk of diabetes while taking this medication. This medication should stop regular monthly menstruation in women. Tell your care team if you continue to menstruate. Talk to your care team if you wish to become pregnant or think you might be pregnant. This medication can cause serious birth defects if taken during pregnancy or for 12 weeks after stopping treatment. Talk to your care team about reliable forms of contraception. Do not breastfeed while taking this   medication. This medication may cause infertility. Talk to your care team if you are concerned about your fertility. What side effects may I notice from receiving this medication? Side effects that you should report to your care team as soon as possible: Allergic reactions--skin rash, itching, hives, swelling  of the face, lips, tongue, or throat Change in the amount of urine Heart attack--pain or tightness in the chest, shoulders, arms, or jaw, nausea, shortness of breath, cold or clammy skin, feeling faint or lightheaded Heart rhythm changes--fast or irregular heartbeat, dizziness, feeling faint or lightheaded, chest pain, trouble breathing High blood sugar (hyperglycemia)--increased thirst or amount of urine, unusual weakness or fatigue, blurry vision High calcium level--increased thirst or amount of urine, nausea, vomiting, confusion, unusual weakness or fatigue, bone pain Pain, redness, irritation, or bruising at the injection site Severe back pain, numbness or weakness of the hands, arms, legs, or feet, loss of coordination, loss of bowel or bladder control Stroke--sudden numbness or weakness of the face, arm, or leg, trouble speaking, confusion, trouble walking, loss of balance or coordination, dizziness, severe headache, change in vision Swelling and pain of the tumor site or lymph nodes Trouble passing urine Side effects that usually do not require medical attention (report to your care team if they continue or are bothersome): Change in sex drive or performance Headache Hot flashes Rapid or extreme change in emotion or mood Sweating Swelling of the ankles, hands, or feet Unusual vaginal discharge, itching, or odor This list may not describe all possible side effects. Call your doctor for medical advice about side effects. You may report side effects to FDA at 1-800-FDA-1088. Where should I keep my medication? This medication is given in a hospital or clinic. It will not be stored at home. NOTE: This sheet is a summary. It may not cover all possible information. If you have questions about this medicine, talk to your doctor, pharmacist, or health care provider.  2023 Elsevier/Gold Standard (2007-02-24 00:00:00)  

## 2022-02-01 NOTE — Progress Notes (Signed)
Patient Care Team: Kerry Dory, NP as PCP - General (Nurse Practitioner) Rockwell Germany, RN as Oncology Nurse Navigator Mauro Kaufmann, RN as Oncology Nurse Navigator Nicholas Lose, MD as Consulting Physician (Hematology and Oncology) Rolm Bookbinder, MD as Consulting Physician (General Surgery)  DIAGNOSIS:  Encounter Diagnosis  Name Primary?   Malignant neoplasm of upper-outer quadrant of left breast in female, estrogen receptor positive (Allison Dodson) Yes    SUMMARY OF ONCOLOGIC HISTORY: Oncology History  Malignant neoplasm of upper-outer quadrant of left breast in female, estrogen receptor positive (Allison Dodson)  09/25/2020 Initial Diagnosis   Palpable lump in the left breast.  Mammogram revealed 3 cm tumor and a possible 5 mm satellite nodule.  Biopsy revealed grade 3 IDC ER 40% weak, PR 0%, HER2 negative, Ki-67 60%   09/25/2020 Cancer Staging   Staging form: Breast, AJCC 8th Edition - Clinical stage from 09/25/2020: Stage IIB (cT2, cN0, cM0, G3, ER+, PR-, HER2-) - Signed by Nicholas Lose, MD on 09/25/2020 Stage prefix: Initial diagnosis Histologic grading system: 3 grade system   10/02/2020 Genetic Testing   Negative hereditary cancer genetic testing: no pathogenic variants detected in [facility] BRCAPlus Panel and [facility] CancerNext-Expanded +RNAinsight Panel.  The report dates are October 02, 2020 and October 13, 2020, respectively.   The BRCAplus panel offered by [employer] and includes sequencing and deletion/duplication analysis for the following 8 genes: ATM, BRCA1, BRCA2, CDH1, CHEK2, PALB2, PTEN, and TP53.  The CancerNext-Expanded gene panel offered by [facility] and includes sequencing, rearrangement, and RNA analysis for the following 77 genes: AIP, ALK, APC, ATM, AXIN2, BAP1, BARD1, BLM, BMPR1A, BRCA1, BRCA2, BRIP1, CDC73, CDH1, CDK4, CDKN1B, CDKN2A, CHEK2, CTNNA1, DICER1, FANCC, FH, FLCN, GALNT12, KIF1B, LZTR1, MAX, MEN1, MET, MLH1, MSH2, MSH3, MSH6, MUTYH, NBN, NF1, NF2,  NTHL1, PALB2, PHOX2B, PMS2, POT1, PRKAR1A, PTCH1, PTEN, RAD51C, RAD51D, RB1, RECQL, RET, SDHA, SDHAF2, SDHB, SDHC, SDHD, SMAD4, SMARCA4, SMARCB1, SMARCE1, STK11, SUFU, TMEM127, TP53, TSC1, TSC2, VHL and XRCC2 (sequencing and deletion/duplication); EGFR, EGLN1, HOXB13, KIT, MITF, PDGFRA, POLD1, and POLE (sequencing only); EPCAM and GREM1 (deletion/duplication only).    11/03/2020 -  Chemotherapy   Patient is on Treatment Plan : BREAST ADJUVANT DOSE DENSE AC q14d / PACLitaxel switched to Abraxane (due to rxn) q7d      04/20/2021 Surgery   Right mastectomy: Negative for cancer Left mastectomy: High-grade 1.4 cm IDC with clear margins, 0/7 lymph nodes negative Biomarkers on the initial biopsy: ER 40% weak, PR 0%, HER2 1+ negative, Ki-67 60%    05/17/2021 -  Anti-estrogen oral therapy   Anastrozole, Zoladex every 4 weeks     CHIEF COMPLIANT: Follow-up on anastrozole and Zoladex  INTERVAL HISTORY: Allison Dodson is a 27 y.o. with above-mentioned history of breast cancer, currently on anastrozole and Zoladex . She presents to the clinic today  She states that she has mild hot flashes at least 2 a day. 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  Based on bone density testing.  ALLERGIES:  is allergic to paclitaxel.  MEDICATIONS:  Current Outpatient Medications  Medication Sig Dispense Refill   ALPRAZolam (XANAX) 0.25 MG tablet Take by mouth.     anastrozole (ARIMIDEX) 1 MG tablet Take 1 tablet (1 mg total) by mouth daily. 90 tablet 3   estradiol (ESTRACE VAGINAL) 0.1 MG/GM vaginal cream Place 1 Applicatorful vaginally at bedtime. 42.5 g 12   No current facility-administered medications for this visit.    PHYSICAL EXAMINATION: ECOG PERFORMANCE STATUS: 1 - Symptomatic  but completely ambulatory  Vitals:   02/01/22 1512  BP: (!) 146/76  Pulse: (!) 116  Resp: 17  Temp: (!) 97.5 F (36.4 C)  SpO2: 99%   Filed Weights   02/01/22 1512  Weight: 234 lb (106.1 kg)     BREAST: No palpable lumps in bilateral chest wall or axilla. (exam performed in the presence of a chaperone)  LABORATORY DATA:  I have reviewed the data as listed    Latest Ref Rng & Units 03/29/2021    8:33 AM 03/23/2021    9:58 AM 03/16/2021    9:28 AM  CMP  Glucose 70 - 99 mg/dL 114  99  134   BUN 6 - 20 mg/dL '11  10  9   '$ Creatinine 0.44 - 1.00 mg/dL 0.50  0.44  0.51   Sodium 135 - 145 mmol/L 139  138  140   Potassium 3.5 - 5.1 mmol/L 3.9  3.8  3.7   Chloride 98 - 111 mmol/L 107  106  106   CO2 22 - 32 mmol/L '25  26  27   '$ Calcium 8.9 - 10.3 mg/dL 9.2  9.6  9.4   Total Protein 6.5 - 8.1 g/dL 6.6  7.3  7.1   Total Bilirubin 0.3 - 1.2 mg/dL 0.3  0.3  0.3   Alkaline Phos 38 - 126 U/L 61  57  57   AST 15 - 41 U/L '18  21  24   '$ ALT 0 - 44 U/L 34  36  41     Lab Results  Component Value Date   WBC 3.0 (L) 03/29/2021   HGB 10.7 (L) 03/29/2021   HCT 32.2 (L) 03/29/2021   MCV 87.0 03/29/2021   PLT 305 03/29/2021   NEUTROABS 1.8 03/29/2021    ASSESSMENT & PLAN:  Malignant neoplasm of upper-outer quadrant of left breast in female, estrogen receptor positive (Allison Dodson) 09/22/2020: Palpable mass in the left breast corresponding to suspicious mass on mammogram measuring 3 cm, possible 5 mm satellite nodule, left axilla negative, right breast negative ER 40% weak, PR negative, HER2 negative, Ki-67 60%   Breast MRI 09/28/2020: 3.7 cm enhancing mass at 3 o'clock position left breast with a possible 7 mm satellite nodule, no abnormal lymph nodes.   Treatment plan: 1.  Neoadjuvant chemotherapy with dose dense Adriamycin and Cytoxan followed by Taxol weekly x12 completed 03/29/2021 2. 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  Followed by adjuvant antiestrogen therapy with ovarian function suppression and aromatase  inhibitor therapy Patient had her wedding on 10/22/2020 ------------------------------------------------------------------------   Treatment plan: Adjuvant antiestrogen therapy (ovarian function suppression with AI) No role of Verzenio. Patient to come monthly for Zoladex injections. Anastrozole started 05/17/2021.  Toxicities: Hot flashes moderate Vaginal dryness and decreased libido: I sent her a prescription for topical Estrace cream to be used twice a week Pain with injections: It appears to be better if the administration is slower and the injection has been warmed to room temperature. Osteoporosis: Z-score -2.6: Discussed with her about Prolia injections once her dental issues have resolved   If she wants to get pregnant I recommended that we wait 2 years. Return to clinic in 6 months for follow-up and monthly for injections   No orders of the defined types were placed in this encounter.  The patient has  a good understanding of the overall plan. she agrees with it. she will call with any problems that may develop before the next visit here. Total time spent: 30 mins including face to face time and time spent for planning, charting and co-ordination of care   Harriette Ohara, MD 02/01/22    I Gardiner Coins am acting as a Education administrator for [employer]  I have reviewed the above documentation for accuracy and completeness, and I agree with the above.

## 2022-02-01 NOTE — Assessment & Plan Note (Addendum)
09/22/2020: Palpable mass in the left breast corresponding to suspicious mass on mammogram measuring 3 cm, possible 5 mm satellite nodule, left axilla negative, right breast negative ER 40% weak, PR negative, HER2 negative, Ki-67 60%   Breast MRI 09/28/2020: 3.7 cm enhancing mass at 3 o'clock position left breast with a possible 7 mm satellite nodule, no abnormal lymph nodes.   Treatment plan: 1.  Neoadjuvant chemotherapy with dose dense Adriamycin and Cytoxan followed by Taxol weekly x12 completed 03/29/2021 2. 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  Followed by adjuvant antiestrogen therapy with ovarian function suppression and aromatase inhibitor therapy Patient had her wedding on 10/22/2020 ------------------------------------------------------------------------   Treatment plan: Adjuvant antiestrogen therapy (ovarian function suppression with AI) No role of Verzenio. Patient to come monthly for Zoladex injections. Anastrozole started 05/17/2021.  Toxicities:   If she wants to get pregnant I recommended that we wait 2 years. Return to clinic in 3 months for survivorship care plan visit

## 2022-02-04 ENCOUNTER — Other Ambulatory Visit: Payer: Self-pay

## 2022-02-04 DIAGNOSIS — C50412 Malignant neoplasm of upper-outer quadrant of left female breast: Secondary | ICD-10-CM

## 2022-02-04 NOTE — Progress Notes (Signed)
Per md orders entered for [employer]. Requisition and all supported documents faxed to [phone]. Faxed confirmation was received.  

## 2022-02-07 ENCOUNTER — Telehealth: Payer: Self-pay | Admitting: Hematology and Oncology

## 2022-02-07 NOTE — Telephone Encounter (Signed)
Scheduled appointments per 1/16 los. Patient is aware.

## 2022-02-10 ENCOUNTER — Other Ambulatory Visit: Payer: Self-pay | Admitting: *Deleted

## 2022-02-10 MED ORDER — ANASTROZOLE 1 MG PO TABS: 1.0000 mg | ORAL_TABLET | Freq: Every day | ORAL | 3 refills | Status: DC

## 2022-02-28 ENCOUNTER — Ambulatory Visit: Payer: No Typology Code available for payment source | Attending: Hematology and Oncology

## 2022-02-28 VITALS — Wt 232.0 lb

## 2022-02-28 DIAGNOSIS — Z483 Aftercare following surgery for neoplasm: Secondary | ICD-10-CM

## 2022-02-28 NOTE — Therapy (Signed)
  OUTPATIENT PHYSICAL THERAPY SOZO SCREENING NOTE   Patient Name: Allison Dodson MRN: [id number] DOB:July 13, 1995, 27 y.o., female Today's Date: 02/28/2022  PCP: Kerry Dory, NP REFERRING PROVIDER: Nicholas Lose, MD   PT End of Session - 02/28/22 1648     Visit Number 13   # unchanged due to screen only   PT Start Time 3254    PT Stop Time 1651    PT Time Calculation (min) 4 min    Activity Tolerance Patient tolerated treatment well    Behavior During Therapy [facility] for tasks assessed/performed             Past Medical History:  Diagnosis Date   Cancer ([place])    Family history of breast cancer 09/25/2020   Port-A-Cath in place 12/29/2020   Past Surgical History:  Procedure Laterality Date   MASTECTOMY W/ SENTINEL NODE BIOPSY Left 04/20/2021   Procedure: LEFT MASTECTOMY WITH LEFT AXILLARY SENTINEL LYMPH NODE BIOPSY;  Surgeon: Rolm Bookbinder, MD;  Location: [place];  Service: General;  Laterality: Left;   PORT-A-CATH REMOVAL Right 04/20/2021   Procedure: REMOVAL PORT-A-CATH;  Surgeon: Rolm Bookbinder, MD;  Location: [place];  Service: General;  Laterality: Right;   PORTACATH PLACEMENT N/A 11/02/2020   Procedure: INSERTION PORT-A-CATH;  Surgeon: Rolm Bookbinder, MD;  Location[facility];  Service: General;  Laterality: N/A;   TOTAL MASTECTOMY Right 04/20/2021   Procedure: RIGHT TOTAL MASTECTOMY;  Surgeon: Rolm Bookbinder, MD;  Location: [place];  Service: General;  Laterality: Right;   Patient Active Problem List   Diagnosis Date Noted   Genetic testing 10/05/2020   Malignant neoplasm of upper-outer quadrant of left breast in female, estrogen receptor positive ([place]) 09/25/2020   Family history of breast cancer 09/25/2020    REFERRING DIAG: left breast cancer at risk for lymphedema  THERAPY DIAG: Aftercare following surgery for neoplasm  PERTINENT HISTORY: L breast cancer ER+ PR- Her2-; will complete  neoadjuvant chemo then pt plans on having a double mastectomy  PRECAUTIONS: left UE Lymphedema risk, None  SUBJECTIVE: Pt returns for her 3 month L-Dex screen.   PAIN:  Are you having pain? No  SOZO SCREENING: Patient was assessed today using the SOZO machine to determine the lymphedema index score. This was compared to her baseline score. It was determined that she is within the recommended range when compared to her baseline and no further action is needed at this time. She will continue SOZO screenings. These are done every 3 months for 2 years post operatively followed by every 6 months for 2 years, and then annually.   L-DEX FLOWSHEETS - 02/28/22 1600       L-DEX LYMPHEDEMA SCREENING   Measurement Type Unilateral    L-DEX MEASUREMENT EXTREMITY Upper Extremity    POSITION  Standing    DOMINANT SIDE Right    At Risk Side Left    BASELINE SCORE (UNILATERAL) -3.2    L-DEX SCORE (UNILATERAL) -2.5    VALUE CHANGE (UNILAT) 0.7              Otelia Limes, PTA 02/28/2022, 4:51 PM

## 2022-03-01 ENCOUNTER — Other Ambulatory Visit: Payer: Self-pay

## 2022-03-01 ENCOUNTER — Inpatient Hospital Stay: Payer: No Typology Code available for payment source | Attending: Hematology and Oncology

## 2022-03-01 DIAGNOSIS — Z170 Estrogen receptor positive status [ER+]: Secondary | ICD-10-CM

## 2022-03-01 DIAGNOSIS — Z5111 Encounter for antineoplastic chemotherapy: Secondary | ICD-10-CM | POA: Insufficient documentation

## 2022-03-01 DIAGNOSIS — C50412 Malignant neoplasm of upper-outer quadrant of left female breast: Secondary | ICD-10-CM | POA: Diagnosis present

## 2022-03-01 MED ORDER — GOSERELIN ACETATE 3.6 MG ~~LOC~~ IMPL
3.6000 mg | DRUG_IMPLANT | Freq: Once | SUBCUTANEOUS | Status: AC
  Administered 2022-03-01: 3.6 mg via SUBCUTANEOUS
  Filled 2022-03-01: qty 3.6

## 2022-03-01 NOTE — Patient Instructions (Signed)
Goserelin Implant What is this medication? GOSERELIN (GOE se rel in) treats prostate cancer and breast cancer. It works by decreasing levels of the hormones testosterone and estrogen in the body. This prevents prostate and breast cancer cells from spreading or growing. It may also be used to treat endometriosis. This is a condition where the tissue that lines the uterus grows outside the uterus. It works by decreasing the amount of estrogen your body makes, which reduces heavy bleeding and pain. It can also be used to help thin the lining of the uterus before a surgery used to prevent or reduce heavy periods. This medicine may be used for other purposes; ask your health care provider or pharmacist if you have questions. COMMON BRAND NAME(S): Zoladex, Zoladex 3-Month What should I tell my care team before I take this medication? They need to know if you have any of these conditions: Bone problems Diabetes Heart disease History of irregular heartbeat or rhythm An unusual or allergic reaction to goserelin, other medications, foods, dyes, or preservatives Pregnant or trying to get pregnant Breastfeeding How should I use this medication? This medication is injected under the skin. It is given by your care team in a hospital or clinic setting. Talk to your care team about the use of this medication in children. Special care may be needed. Overdosage: If you think you have taken too much of this medicine contact a poison control center or emergency room at once. NOTE: This medicine is only for you. Do not share this medicine with others. What if I miss a dose? Keep appointments for follow-up doses. It is important not to miss your dose. Call your care team if you are unable to keep an appointment. What may interact with this medication? Do not take this medication with any of the following: Cisapride Dronedarone Pimozide Thioridazine This medication may also interact with the following: Other  medications that cause heart rhythm changes This list may not describe all possible interactions. Give your health care provider a list of all the medicines, herbs, non-prescription drugs, or dietary supplements you use. Also tell them if you smoke, drink alcohol, or use illegal drugs. Some items may interact with your medicine. What should I watch for while using this medication? Visit your care team for regular checks on your progress. Your symptoms may appear to get worse during the first weeks of this therapy. Tell your care team if your symptoms do not start to get better or if they get worse after this time. Using this medication for a long time may weaken your bones. If you smoke or frequently drink alcohol you may increase your risk of bone loss. A family history of osteoporosis, chronic use of medications for seizures (convulsions), or corticosteroids can also increase your risk of bone loss. The risk of bone fractures may be increased. Talk to your care team about your bone health. This medication may increase blood sugar. The risk may be higher in patients who already have diabetes. Ask your care team what you can do to lower your risk of diabetes while taking this medication. This medication should stop regular monthly menstruation in women. Tell your care team if you continue to menstruate. Talk to your care team if you wish to become pregnant or think you might be pregnant. This medication can cause serious birth defects if taken during pregnancy or for 12 weeks after stopping treatment. Talk to your care team about reliable forms of contraception. Do not breastfeed while taking this   medication. This medication may cause infertility. Talk to your care team if you are concerned about your fertility. What side effects may I notice from receiving this medication? Side effects that you should report to your care team as soon as possible: Allergic reactions--skin rash, itching, hives, swelling  of the face, lips, tongue, or throat Change in the amount of urine Heart attack--pain or tightness in the chest, shoulders, arms, or jaw, nausea, shortness of breath, cold or clammy skin, feeling faint or lightheaded Heart rhythm changes--fast or irregular heartbeat, dizziness, feeling faint or lightheaded, chest pain, trouble breathing High blood sugar (hyperglycemia)--increased thirst or amount of urine, unusual weakness or fatigue, blurry vision High calcium level--increased thirst or amount of urine, nausea, vomiting, confusion, unusual weakness or fatigue, bone pain Pain, redness, irritation, or bruising at the injection site Severe back pain, numbness or weakness of the hands, arms, legs, or feet, loss of coordination, loss of bowel or bladder control Stroke--sudden numbness or weakness of the face, arm, or leg, trouble speaking, confusion, trouble walking, loss of balance or coordination, dizziness, severe headache, change in vision Swelling and pain of the tumor site or lymph nodes Trouble passing urine Side effects that usually do not require medical attention (report to your care team if they continue or are bothersome): Change in sex drive or performance Headache Hot flashes Rapid or extreme change in emotion or mood Sweating Swelling of the ankles, hands, or feet Unusual vaginal discharge, itching, or odor This list may not describe all possible side effects. Call your doctor for medical advice about side effects. You may report side effects to FDA at 1-800-FDA-1088. Where should I keep my medication? This medication is given in a hospital or clinic. It will not be stored at home. NOTE: This sheet is a summary. It may not cover all possible information. If you have questions about this medicine, talk to your doctor, pharmacist, or health care provider.  2023 Elsevier/Gold Standard (2007-02-24 00:00:00)  

## 2022-03-06 LAB — SIGNATERA ONLY (NATERA MANAGED)
SIGNATERA MTM READOUT: 0 MTM/ml
SIGNATERA TEST RESULT: NEGATIVE

## 2022-03-07 ENCOUNTER — Encounter (HOSPITAL_COMMUNITY): Payer: Self-pay

## 2022-03-29 ENCOUNTER — Inpatient Hospital Stay: Payer: No Typology Code available for payment source | Attending: Hematology and Oncology

## 2022-03-29 ENCOUNTER — Other Ambulatory Visit: Payer: Self-pay

## 2022-03-29 VITALS — BP 135/76 | HR 98 | Temp 98.2°F | Resp 16

## 2022-03-29 DIAGNOSIS — Z170 Estrogen receptor positive status [ER+]: Secondary | ICD-10-CM | POA: Diagnosis not present

## 2022-03-29 DIAGNOSIS — C50412 Malignant neoplasm of upper-outer quadrant of left female breast: Secondary | ICD-10-CM | POA: Diagnosis present

## 2022-03-29 DIAGNOSIS — Z5111 Encounter for antineoplastic chemotherapy: Secondary | ICD-10-CM | POA: Insufficient documentation

## 2022-03-29 MED ORDER — GOSERELIN ACETATE 3.6 MG ~~LOC~~ IMPL
3.6000 mg | DRUG_IMPLANT | Freq: Once | SUBCUTANEOUS | Status: AC
  Administered 2022-03-29: 3.6 mg via SUBCUTANEOUS
  Filled 2022-03-29: qty 3.6

## 2022-03-29 NOTE — Patient Instructions (Signed)
Goserelin Implant What is this medication? GOSERELIN (GOE se rel in) treats prostate cancer and breast cancer. It works by decreasing levels of the hormones testosterone and estrogen in the body. This prevents prostate and breast cancer cells from spreading or growing. It may also be used to treat endometriosis. This is a condition where the tissue that lines the uterus grows outside the uterus. It works by decreasing the amount of estrogen your body makes, which reduces heavy bleeding and pain. It can also be used to help thin the lining of the uterus before a surgery used to prevent or reduce heavy periods. This medicine may be used for other purposes; ask your health care provider or pharmacist if you have questions. COMMON BRAND NAME(S): Zoladex, Zoladex 3-Month What should I tell my care team before I take this medication? They need to know if you have any of these conditions: Bone problems Diabetes Heart disease History of irregular heartbeat or rhythm An unusual or allergic reaction to goserelin, other medications, foods, dyes, or preservatives Pregnant or trying to get pregnant Breastfeeding How should I use this medication? This medication is injected under the skin. It is given by your care team in a hospital or clinic setting. Talk to your care team about the use of this medication in children. Special care may be needed. Overdosage: If you think you have taken too much of this medicine contact a poison control center or emergency room at once. NOTE: This medicine is only for you. Do not share this medicine with others. What if I miss a dose? Keep appointments for follow-up doses. It is important not to miss your dose. Call your care team if you are unable to keep an appointment. What may interact with this medication? Do not take this medication with any of the following: Cisapride Dronedarone Pimozide Thioridazine This medication may also interact with the following: Other  medications that cause heart rhythm changes This list may not describe all possible interactions. Give your health care provider a list of all the medicines, herbs, non-prescription drugs, or dietary supplements you use. Also tell them if you smoke, drink alcohol, or use illegal drugs. Some items may interact with your medicine. What should I watch for while using this medication? Visit your care team for regular checks on your progress. Your symptoms may appear to get worse during the first weeks of this therapy. Tell your care team if your symptoms do not start to get better or if they get worse after this time. Using this medication for a long time may weaken your bones. If you smoke or frequently drink alcohol you may increase your risk of bone loss. A family history of osteoporosis, chronic use of medications for seizures (convulsions), or corticosteroids can also increase your risk of bone loss. The risk of bone fractures may be increased. Talk to your care team about your bone health. This medication may increase blood sugar. The risk may be higher in patients who already have diabetes. Ask your care team what you can do to lower your risk of diabetes while taking this medication. This medication should stop regular monthly menstruation in women. Tell your care team if you continue to menstruate. Talk to your care team if you wish to become pregnant or think you might be pregnant. This medication can cause serious birth defects if taken during pregnancy or for 12 weeks after stopping treatment. Talk to your care team about reliable forms of contraception. Do not breastfeed while taking this   medication. This medication may cause infertility. Talk to your care team if you are concerned about your fertility. What side effects may I notice from receiving this medication? Side effects that you should report to your care team as soon as possible: Allergic reactions--skin rash, itching, hives, swelling  of the face, lips, tongue, or throat Change in the amount of urine Heart attack--pain or tightness in the chest, shoulders, arms, or jaw, nausea, shortness of breath, cold or clammy skin, feeling faint or lightheaded Heart rhythm changes--fast or irregular heartbeat, dizziness, feeling faint or lightheaded, chest pain, trouble breathing High blood sugar (hyperglycemia)--increased thirst or amount of urine, unusual weakness or fatigue, blurry vision High calcium level--increased thirst or amount of urine, nausea, vomiting, confusion, unusual weakness or fatigue, bone pain Pain, redness, irritation, or bruising at the injection site Severe back pain, numbness or weakness of the hands, arms, legs, or feet, loss of coordination, loss of bowel or bladder control Stroke--sudden numbness or weakness of the face, arm, or leg, trouble speaking, confusion, trouble walking, loss of balance or coordination, dizziness, severe headache, change in vision Swelling and pain of the tumor site or lymph nodes Trouble passing urine Side effects that usually do not require medical attention (report to your care team if they continue or are bothersome): Change in sex drive or performance Headache Hot flashes Rapid or extreme change in emotion or mood Sweating Swelling of the ankles, hands, or feet Unusual vaginal discharge, itching, or odor This list may not describe all possible side effects. Call your doctor for medical advice about side effects. You may report side effects to FDA at 1-800-FDA-1088. Where should I keep my medication? This medication is given in a hospital or clinic. It will not be stored at home. NOTE: This sheet is a summary. It may not cover all possible information. If you have questions about this medicine, talk to your doctor, pharmacist, or health care provider.  2023 Elsevier/Gold Standard (2021-05-19 00:00:00)  

## 2022-04-26 ENCOUNTER — Inpatient Hospital Stay: Payer: No Typology Code available for payment source | Attending: Hematology and Oncology

## 2022-04-26 ENCOUNTER — Other Ambulatory Visit: Payer: Self-pay

## 2022-04-26 VITALS — BP 125/97 | HR 90 | Temp 98.3°F | Resp 16

## 2022-04-26 DIAGNOSIS — C50412 Malignant neoplasm of upper-outer quadrant of left female breast: Secondary | ICD-10-CM | POA: Insufficient documentation

## 2022-04-26 DIAGNOSIS — Z170 Estrogen receptor positive status [ER+]: Secondary | ICD-10-CM | POA: Diagnosis not present

## 2022-04-26 DIAGNOSIS — Z5111 Encounter for antineoplastic chemotherapy: Secondary | ICD-10-CM | POA: Diagnosis present

## 2022-04-26 MED ORDER — GOSERELIN ACETATE 3.6 MG ~~LOC~~ IMPL
3.6000 mg | DRUG_IMPLANT | Freq: Once | SUBCUTANEOUS | Status: AC
  Administered 2022-04-26: 3.6 mg via SUBCUTANEOUS
  Filled 2022-04-26: qty 3.6

## 2022-04-26 NOTE — Patient Instructions (Signed)
Goserelin Implant What is this medication? GOSERELIN (GOE se rel in) treats prostate cancer and breast cancer. It works by decreasing levels of the hormones testosterone and estrogen in the body. This prevents prostate and breast cancer cells from spreading or growing. It may also be used to treat endometriosis. This is a condition where the tissue that lines the uterus grows outside the uterus. It works by decreasing the amount of estrogen your body makes, which reduces heavy bleeding and pain. It can also be used to help thin the lining of the uterus before a surgery used to prevent or reduce heavy periods. This medicine may be used for other purposes; ask your health care provider or pharmacist if you have questions. COMMON BRAND NAME(S): Zoladex, Zoladex 3-Month What should I tell my care team before I take this medication? They need to know if you have any of these conditions: Bone problems Diabetes Heart disease History of irregular heartbeat or rhythm An unusual or allergic reaction to goserelin, other medications, foods, dyes, or preservatives Pregnant or trying to get pregnant Breastfeeding How should I use this medication? This medication is injected under the skin. It is given by your care team in a hospital or clinic setting. Talk to your care team about the use of this medication in children. Special care may be needed. Overdosage: If you think you have taken too much of this medicine contact a poison control center or emergency room at once. NOTE: This medicine is only for you. Do not share this medicine with others. What if I miss a dose? Keep appointments for follow-up doses. It is important not to miss your dose. Call your care team if you are unable to keep an appointment. What may interact with this medication? Do not take this medication with any of the following: Cisapride Dronedarone Pimozide Thioridazine This medication may also interact with the following: Other  medications that cause heart rhythm changes This list may not describe all possible interactions. Give your health care provider a list of all the medicines, herbs, non-prescription drugs, or dietary supplements you use. Also tell them if you smoke, drink alcohol, or use illegal drugs. Some items may interact with your medicine. What should I watch for while using this medication? Visit your care team for regular checks on your progress. Your symptoms may appear to get worse during the first weeks of this therapy. Tell your care team if your symptoms do not start to get better or if they get worse after this time. Using this medication for a long time may weaken your bones. If you smoke or frequently drink alcohol you may increase your risk of bone loss. A family history of osteoporosis, chronic use of medications for seizures (convulsions), or corticosteroids can also increase your risk of bone loss. The risk of bone fractures may be increased. Talk to your care team about your bone health. This medication may increase blood sugar. The risk may be higher in patients who already have diabetes. Ask your care team what you can do to lower your risk of diabetes while taking this medication. This medication should stop regular monthly menstruation in women. Tell your care team if you continue to menstruate. Talk to your care team if you wish to become pregnant or think you might be pregnant. This medication can cause serious birth defects if taken during pregnancy or for 12 weeks after stopping treatment. Talk to your care team about reliable forms of contraception. Do not breastfeed while taking this   medication. This medication may cause infertility. Talk to your care team if you are concerned about your fertility. What side effects may I notice from receiving this medication? Side effects that you should report to your care team as soon as possible: Allergic reactions--skin rash, itching, hives, swelling  of the face, lips, tongue, or throat Change in the amount of urine Heart attack--pain or tightness in the chest, shoulders, arms, or jaw, nausea, shortness of breath, cold or clammy skin, feeling faint or lightheaded Heart rhythm changes--fast or irregular heartbeat, dizziness, feeling faint or lightheaded, chest pain, trouble breathing High blood sugar (hyperglycemia)--increased thirst or amount of urine, unusual weakness or fatigue, blurry vision High calcium level--increased thirst or amount of urine, nausea, vomiting, confusion, unusual weakness or fatigue, bone pain Pain, redness, irritation, or bruising at the injection site Severe back pain, numbness or weakness of the hands, arms, legs, or feet, loss of coordination, loss of bowel or bladder control Stroke--sudden numbness or weakness of the face, arm, or leg, trouble speaking, confusion, trouble walking, loss of balance or coordination, dizziness, severe headache, change in vision Swelling and pain of the tumor site or lymph nodes Trouble passing urine Side effects that usually do not require medical attention (report to your care team if they continue or are bothersome): Change in sex drive or performance Headache Hot flashes Rapid or extreme change in emotion or mood Sweating Swelling of the ankles, hands, or feet Unusual vaginal discharge, itching, or odor This list may not describe all possible side effects. Call your doctor for medical advice about side effects. You may report side effects to FDA at 1-800-FDA-1088. Where should I keep my medication? This medication is given in a hospital or clinic. It will not be stored at home. NOTE: This sheet is a summary. It may not cover all possible information. If you have questions about this medicine, talk to your doctor, pharmacist, or health care provider.  2023 Elsevier/Gold Standard (2021-05-19 00:00:00)  

## 2022-05-23 LAB — SIGNATERA
SIGNATERA MTM READOUT: 0 MTM/ml
SIGNATERA TEST RESULT: NEGATIVE

## 2022-05-24 ENCOUNTER — Other Ambulatory Visit: Payer: Self-pay

## 2022-05-24 ENCOUNTER — Inpatient Hospital Stay: Payer: No Typology Code available for payment source | Attending: Hematology and Oncology

## 2022-05-24 VITALS — BP 133/86 | HR 89 | Temp 98.4°F | Resp 18

## 2022-05-24 DIAGNOSIS — Z5111 Encounter for antineoplastic chemotherapy: Secondary | ICD-10-CM | POA: Diagnosis present

## 2022-05-24 DIAGNOSIS — C50412 Malignant neoplasm of upper-outer quadrant of left female breast: Secondary | ICD-10-CM | POA: Insufficient documentation

## 2022-05-24 DIAGNOSIS — Z170 Estrogen receptor positive status [ER+]: Secondary | ICD-10-CM | POA: Insufficient documentation

## 2022-05-24 MED ORDER — GOSERELIN ACETATE 3.6 MG ~~LOC~~ IMPL
3.6000 mg | DRUG_IMPLANT | Freq: Once | SUBCUTANEOUS | Status: AC
  Administered 2022-05-24: 3.6 mg via SUBCUTANEOUS
  Filled 2022-05-24: qty 3.6

## 2022-05-25 ENCOUNTER — Encounter: Payer: Self-pay | Admitting: Hematology and Oncology

## 2022-05-25 ENCOUNTER — Telehealth: Payer: Self-pay

## 2022-05-25 NOTE — Telephone Encounter (Signed)
Attempted to call pt regarding signatera results lvm for pt to return call back to receive results.

## 2022-06-06 ENCOUNTER — Ambulatory Visit: Payer: No Typology Code available for payment source | Attending: Hematology and Oncology

## 2022-06-06 VITALS — Wt 239.5 lb

## 2022-06-06 DIAGNOSIS — Z483 Aftercare following surgery for neoplasm: Secondary | ICD-10-CM | POA: Insufficient documentation

## 2022-06-06 NOTE — Therapy (Signed)
  OUTPATIENT PHYSICAL THERAPY SOZO SCREENING NOTE   Patient Name: Allison Dodson MRN: [id number] DOB:[birth year], 27 y.o., female Today's Date: 06/06/2022  PCP: Tanda Rockers, NP REFERRING PROVIDER: Serena Croissant, MD   PT End of Session - 06/06/22 1656     Visit Number 13   # unchanged due to screen only   PT Start Time 1654    PT Stop Time 1658    PT Time Calculation (min) 4 min    Activity Tolerance Patient tolerated treatment well    Behavior During Therapy [facility] for tasks assessed/performed             Past Medical History:  Diagnosis Date   Cancer (HCC)    Family history of breast cancer 09/25/2020   Port-A-Cath in place 12/29/2020   Past Surgical History:  Procedure Laterality Date   MASTECTOMY W/ SENTINEL NODE BIOPSY Left 04/20/2021   Procedure: LEFT MASTECTOMY WITH LEFT AXILLARY SENTINEL LYMPH NODE BIOPSY;  Surgeon: Emelia Loron, MD;  Location: [facility];  Service: General;  Laterality: Left;   PORT-A-CATH REMOVAL Right 04/20/2021   Procedure: REMOVAL PORT-A-CATH;  Surgeon: Emelia Loron, MD;  Location: [facility];  Service: General;  Laterality: Right;   PORTACATH PLACEMENT N/A 11/02/2020   Procedure: INSERTION PORT-A-CATH;  Surgeon: Emelia Loron, MD;  Location: [facility];  Service: General;  Laterality: N/A;   TOTAL MASTECTOMY Right 04/20/2021   Procedure: RIGHT TOTAL MASTECTOMY;  Surgeon: Emelia Loron, MD;  Location: [facility];  Service: General;  Laterality: Right;   Patient Active Problem List   Diagnosis Date Noted   Genetic testing 10/05/2020   Malignant neoplasm of upper-outer quadrant of left breast in female, estrogen receptor positive (HCC) 09/25/2020   Family history of breast cancer 09/25/2020    REFERRING DIAG: left breast cancer at risk for lymphedema  THERAPY DIAG: Aftercare following surgery for neoplasm  PERTINENT HISTORY: L breast cancer ER+ PR- Her2-; will complete  neoadjuvant chemo then pt plans on having a double mastectomy  PRECAUTIONS: left UE Lymphedema risk, None  SUBJECTIVE: Pt returns for her 3 month L-Dex screen.   PAIN:  Are you having pain? No  SOZO SCREENING: Patient was assessed today using the SOZO machine to determine the lymphedema index score. This was compared to her baseline score. It was determined that she is within the recommended range when compared to her baseline and no further action is needed at this time. She will continue SOZO screenings. These are done every 3 months for 2 years post operatively followed by every 6 months for 2 years, and then annually.   L-DEX FLOWSHEETS - 06/06/22 1600       L-DEX LYMPHEDEMA SCREENING   Measurement Type Unilateral    L-DEX MEASUREMENT EXTREMITY Upper Extremity    POSITION  Standing    DOMINANT SIDE Right    At Risk Side Left    BASELINE SCORE (UNILATERAL) -3.2    L-DEX SCORE (UNILATERAL) -7.3    VALUE CHANGE (UNILAT) -4.1              Hermenia Bers, PTA 06/06/2022, 4:58 PM

## 2022-06-21 ENCOUNTER — Inpatient Hospital Stay: Payer: No Typology Code available for payment source | Attending: Hematology and Oncology

## 2022-06-21 ENCOUNTER — Other Ambulatory Visit: Payer: Self-pay

## 2022-06-21 VITALS — BP 145/85 | HR 80 | Temp 98.2°F | Resp 18

## 2022-06-21 DIAGNOSIS — C50412 Malignant neoplasm of upper-outer quadrant of left female breast: Secondary | ICD-10-CM | POA: Insufficient documentation

## 2022-06-21 DIAGNOSIS — Z170 Estrogen receptor positive status [ER+]: Secondary | ICD-10-CM | POA: Insufficient documentation

## 2022-06-21 DIAGNOSIS — Z5111 Encounter for antineoplastic chemotherapy: Secondary | ICD-10-CM | POA: Insufficient documentation

## 2022-06-21 MED ORDER — GOSERELIN ACETATE 3.6 MG ~~LOC~~ IMPL
3.6000 mg | DRUG_IMPLANT | Freq: Once | SUBCUTANEOUS | Status: AC
  Administered 2022-06-21: 3.6 mg via SUBCUTANEOUS
  Filled 2022-06-21: qty 3.6

## 2022-07-12 ENCOUNTER — Telehealth: Payer: Self-pay | Admitting: Hematology and Oncology

## 2022-07-12 NOTE — Telephone Encounter (Signed)
Rescheduled appointment per provider PAL. Left voicemail. 

## 2022-07-19 ENCOUNTER — Inpatient Hospital Stay: Payer: No Typology Code available for payment source | Admitting: Hematology and Oncology

## 2022-07-19 ENCOUNTER — Other Ambulatory Visit: Payer: Self-pay

## 2022-07-19 ENCOUNTER — Inpatient Hospital Stay: Payer: No Typology Code available for payment source | Attending: Hematology and Oncology

## 2022-07-19 VITALS — BP 140/89 | HR 98 | Temp 97.8°F | Resp 18

## 2022-07-19 DIAGNOSIS — C50412 Malignant neoplasm of upper-outer quadrant of left female breast: Secondary | ICD-10-CM | POA: Insufficient documentation

## 2022-07-19 DIAGNOSIS — Z170 Estrogen receptor positive status [ER+]: Secondary | ICD-10-CM | POA: Diagnosis not present

## 2022-07-19 DIAGNOSIS — Z5111 Encounter for antineoplastic chemotherapy: Secondary | ICD-10-CM | POA: Diagnosis present

## 2022-07-19 MED ORDER — GOSERELIN ACETATE 3.6 MG ~~LOC~~ IMPL
3.6000 mg | DRUG_IMPLANT | Freq: Once | SUBCUTANEOUS | Status: AC
  Administered 2022-07-19: 3.6 mg via SUBCUTANEOUS
  Filled 2022-07-19: qty 3.6

## 2022-08-16 ENCOUNTER — Inpatient Hospital Stay (HOSPITAL_BASED_OUTPATIENT_CLINIC_OR_DEPARTMENT_OTHER): Payer: No Typology Code available for payment source | Admitting: Hematology and Oncology

## 2022-08-16 ENCOUNTER — Inpatient Hospital Stay: Payer: No Typology Code available for payment source

## 2022-08-16 VITALS — BP 129/69 | HR 86 | Temp 98.1°F | Resp 18

## 2022-08-16 VITALS — BP 142/87 | HR 92 | Temp 97.5°F | Resp 18 | Ht 63.0 in | Wt 241.1 lb

## 2022-08-16 DIAGNOSIS — Z170 Estrogen receptor positive status [ER+]: Secondary | ICD-10-CM

## 2022-08-16 DIAGNOSIS — Z5111 Encounter for antineoplastic chemotherapy: Secondary | ICD-10-CM | POA: Diagnosis not present

## 2022-08-16 DIAGNOSIS — C50412 Malignant neoplasm of upper-outer quadrant of left female breast: Secondary | ICD-10-CM | POA: Diagnosis not present

## 2022-08-16 MED ORDER — GOSERELIN ACETATE 3.6 MG ~~LOC~~ IMPL
3.6000 mg | DRUG_IMPLANT | Freq: Once | SUBCUTANEOUS | Status: AC
  Administered 2022-08-16: 3.6 mg via SUBCUTANEOUS
  Filled 2022-08-16: qty 3.6

## 2022-08-16 MED ORDER — ESTRADIOL 0.1 MG/GM VA CREA: 1.0000 | TOPICAL_CREAM | VAGINAL | 0 refills | Status: DC

## 2022-08-16 NOTE — Assessment & Plan Note (Addendum)
09/22/2020: Palpable mass in the left breast corresponding to suspicious mass on mammogram measuring 3 cm, possible 5 mm satellite nodule, left axilla negative, right breast negative ER 40% weak, PR negative, HER2 negative, Ki-67 60%   Breast MRI 09/28/2020: 3.7 cm enhancing mass at 3 o'clock position left breast with a possible 7 mm satellite nodule, no abnormal lymph nodes.   Treatment plan: 1.  Neoadjuvant chemotherapy with dose dense Adriamycin and Cytoxan followed by Taxol weekly x12 completed 03/29/2021 2. 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  Followed by adjuvant antiestrogen therapy with ovarian function suppression and aromatase inhibitor therapy Patient had her wedding on 10/22/2020 ------------------------------------------------------------------------   Treatment plan: Adjuvant antiestrogen therapy (ovarian function suppression with AI) No role of Verzenio. Patient to come monthly for Zoladex injections. Anastrozole started 05/17/2021.   Toxicities: Hot flashes moderate Vaginal dryness and decreased libido: I sent her a prescription for topical Estrace cream to be used twice a week Pain with injections: It appears to be better if the administration is slower and the injection has been warmed to room temperature. Bone density: Z-score -2.6: I discussed with her that we do not base the diagnosis of osteoporosis and the Z-score.  It is based T-score.  But because of her age we do not have a T-score.  It is unclear if she actually has osteoporosis.  I recommended calcium vitamin D and watchful monitoring.   If she wants to get pregnant I recommended that we wait 2 years on antiestrogen therapy. Return to clinic in 6 months for follow-up and monthly for injections.

## 2022-08-16 NOTE — Patient Instructions (Signed)
Goserelin Implant What is this medication? GOSERELIN (GOE se rel in) treats prostate cancer and breast cancer. It works by decreasing levels of the hormones testosterone and estrogen in the body. This prevents prostate and breast cancer cells from spreading or growing. It may also be used to treat endometriosis. This is a condition where the tissue that lines the uterus grows outside the uterus. It works by decreasing the amount of estrogen your body makes, which reduces heavy bleeding and pain. It can also be used to help thin the lining of the uterus before a surgery used to prevent or reduce heavy periods. This medicine may be used for other purposes; ask your health care provider or pharmacist if you have questions. COMMON BRAND NAME(S): Zoladex, Zoladex 3-Month What should I tell my care team before I take this medication? They need to know if you have any of these conditions: Bone problems Diabetes Heart disease History of irregular heartbeat or rhythm An unusual or allergic reaction to goserelin, other medications, foods, dyes, or preservatives Pregnant or trying to get pregnant Breastfeeding How should I use this medication? This medication is injected under the skin. It is given by your care team in a hospital or clinic setting. Talk to your care team about the use of this medication in children. Special care may be needed. Overdosage: If you think you have taken too much of this medicine contact a poison control center or emergency room at once. NOTE: This medicine is only for you. Do not share this medicine with others. What if I miss a dose? Keep appointments for follow-up doses. It is important not to miss your dose. Call your care team if you are unable to keep an appointment. What may interact with this medication? Do not take this medication with any of the following: Cisapride Dronedarone Pimozide Thioridazine This medication may also interact with the following: Other  medications that cause heart rhythm changes This list may not describe all possible interactions. Give your health care provider a list of all the medicines, herbs, non-prescription drugs, or dietary supplements you use. Also tell them if you smoke, drink alcohol, or use illegal drugs. Some items may interact with your medicine. What should I watch for while using this medication? Visit your care team for regular checks on your progress. Your symptoms may appear to get worse during the first weeks of this therapy. Tell your care team if your symptoms do not start to get better or if they get worse after this time. Using this medication for a long time may weaken your bones. If you smoke or frequently drink alcohol you may increase your risk of bone loss. A family history of osteoporosis, chronic use of medications for seizures (convulsions), or corticosteroids can also increase your risk of bone loss. The risk of bone fractures may be increased. Talk to your care team about your bone health. This medication may increase blood sugar. The risk may be higher in patients who already have diabetes. Ask your care team what you can do to lower your risk of diabetes while taking this medication. This medication should stop regular monthly menstruation in women. Tell your care team if you continue to menstruate. Talk to your care team if you wish to become pregnant or think you might be pregnant. This medication can cause serious birth defects if taken during pregnancy or for 12 weeks after stopping treatment. Talk to your care team about reliable forms of contraception. Do not breastfeed while taking this   medication. This medication may cause infertility. Talk to your care team if you are concerned about your fertility. What side effects may I notice from receiving this medication? Side effects that you should report to your care team as soon as possible: Allergic reactions--skin rash, itching, hives, swelling  of the face, lips, tongue, or throat Change in the amount of urine Heart attack--pain or tightness in the chest, shoulders, arms, or jaw, nausea, shortness of breath, cold or clammy skin, feeling faint or lightheaded Heart rhythm changes--fast or irregular heartbeat, dizziness, feeling faint or lightheaded, chest pain, trouble breathing High blood sugar (hyperglycemia)--increased thirst or amount of urine, unusual weakness or fatigue, blurry vision High calcium level--increased thirst or amount of urine, nausea, vomiting, confusion, unusual weakness or fatigue, bone pain Pain, redness, irritation, or bruising at the injection site Severe back pain, numbness or weakness of the hands, arms, legs, or feet, loss of coordination, loss of bowel or bladder control Stroke--sudden numbness or weakness of the face, arm, or leg, trouble speaking, confusion, trouble walking, loss of balance or coordination, dizziness, severe headache, change in vision Swelling and pain of the tumor site or lymph nodes Trouble passing urine Side effects that usually do not require medical attention (report to your care team if they continue or are bothersome): Change in sex drive or performance Headache Hot flashes Rapid or extreme change in emotion or mood Sweating Swelling of the ankles, hands, or feet Unusual vaginal discharge, itching, or odor This list may not describe all possible side effects. Call your doctor for medical advice about side effects. You may report side effects to FDA at 1-800-FDA-1088. Where should I keep my medication? This medication is given in a hospital or clinic. It will not be stored at home. NOTE: This sheet is a summary. It may not cover all possible information. If you have questions about this medicine, talk to your doctor, pharmacist, or health care provider.  2023 Elsevier/Gold Standard (2021-05-19 00:00:00)  

## 2022-08-16 NOTE — Progress Notes (Signed)
Patient Care Team: Tanda Rockers, NP as PCP - General (Nurse Practitioner) Donnelly Angelica, RN as Oncology Nurse Navigator Pershing Proud, RN as Oncology Nurse Navigator Serena Croissant, MD as Consulting Physician (Hematology and Oncology) Emelia Loron, MD as Consulting Physician (General Surgery)  DIAGNOSIS:  Encounter Diagnosis  Name Primary?   Malignant neoplasm of upper-outer quadrant of left breast in female, estrogen receptor positive (HCC) Yes    SUMMARY OF ONCOLOGIC HISTORY: Oncology History  Malignant neoplasm of upper-outer quadrant of left breast in female, estrogen receptor positive (HCC)  09/25/2020 Initial Diagnosis   Palpable lump in the left breast.  Mammogram revealed 3 cm tumor and a possible 5 mm satellite nodule.  Biopsy revealed grade 3 IDC ER 40% weak, PR 0%, HER2 negative, Ki-67 60%   09/25/2020 Cancer Staging   Staging form: Breast, AJCC 8th Edition - Clinical stage from 09/25/2020: Stage IIB (cT2, cN0, cM0, G3, ER+, PR-, HER2-) - Signed by Serena Croissant, MD on 09/25/2020 Stage prefix: Initial diagnosis Histologic grading system: 3 grade system   10/02/2020 Genetic Testing   Negative hereditary cancer genetic testing: no pathogenic variants detected in [facility] BRCAPlus Panel and [facility] CancerNext-Expanded +RNAinsight Panel.  The report dates are October 02, 2020 and October 13, 2020, respectively.   The BRCAplus panel offered by [employer] and includes sequencing and deletion/duplication analysis for the following 8 genes: ATM, BRCA1, BRCA2, CDH1, CHEK2, PALB2, PTEN, and TP53.  The CancerNext-Expanded gene panel offered by [facility] and includes sequencing, rearrangement, and RNA analysis for the following 77 genes: AIP, ALK, APC, ATM, AXIN2, BAP1, BARD1, BLM, BMPR1A, BRCA1, BRCA2, BRIP1, CDC73, CDH1, CDK4, CDKN1B, CDKN2A, CHEK2, CTNNA1, DICER1, FANCC, FH, FLCN, GALNT12, KIF1B, LZTR1, MAX, MEN1, MET, MLH1, MSH2, MSH3, MSH6, MUTYH, NBN, NF1, NF2,  NTHL1, PALB2, PHOX2B, PMS2, POT1, PRKAR1A, PTCH1, PTEN, RAD51C, RAD51D, RB1, RECQL, RET, SDHA, SDHAF2, SDHB, SDHC, SDHD, SMAD4, SMARCA4, SMARCB1, SMARCE1, STK11, SUFU, TMEM127, TP53, TSC1, TSC2, VHL and XRCC2 (sequencing and deletion/duplication); EGFR, EGLN1, HOXB13, KIT, MITF, PDGFRA, POLD1, and POLE (sequencing only); EPCAM and GREM1 (deletion/duplication only).    11/03/2020 -  Chemotherapy   Patient is on Treatment Plan : BREAST ADJUVANT DOSE DENSE AC q14d / PACLitaxel switched to Abraxane (due to rxn) q7d      04/20/2021 Surgery   Right mastectomy: Negative for cancer Left mastectomy: High-grade 1.4 cm IDC with clear margins, 0/7 lymph nodes negative Biomarkers on the initial biopsy: ER 40% weak, PR 0%, HER2 1+ negative, Ki-67 60%    05/17/2021 -  Anti-estrogen oral therapy   Anastrozole, Zoladex every 4 weeks     CHIEF COMPLIANT: Follow-up Zoladex and anastrozole  INTERVAL HISTORY: Allison Dodson is a 27 y.o. with above-mentioned history of breast cancer, currently on anastrozole and Zoladex . She presents to the clinic today. Patient reports that she is tolerating the Zoladex. She is getting hot flashes from the anastrozole. Mild vaginal dryness.  The injection site discomfort is getting better if she uses ice.    ALLERGIES:  is allergic to paclitaxel.  MEDICATIONS:  Current Outpatient Medications  Medication Sig Dispense Refill   ALPRAZolam (XANAX) 0.25 MG tablet Take by mouth.     anastrozole (ARIMIDEX) 1 MG tablet Take 1 tablet (1 mg total) by mouth daily. 90 tablet 3   [START ON 08/17/2022] estradiol (ESTRACE VAGINAL) 0.1 MG/GM vaginal cream Place 1 Applicatorful vaginally 3 (three) times a week. 42.5 g 0   No current facility-administered medications for this visit.  PHYSICAL EXAMINATION: ECOG PERFORMANCE STATUS: 1 - Symptomatic but completely ambulatory  Vitals:   08/16/22 1510  BP: (!) 142/87  Pulse: 92  Resp: 18  Temp: (!) 97.5 F (36.4 C)  SpO2: 98%    Filed Weights   08/16/22 1510  Weight: 241 lb 1.6 oz (109.4 kg)      LABORATORY DATA:  I have reviewed the data as listed    Latest Ref Rng & Units 03/29/2021    8:33 AM 03/23/2021    9:58 AM 03/16/2021    9:28 AM  CMP  Glucose 70 - 99 mg/dL 161  99  096   BUN 6 - 20 mg/dL 11  10  9    Creatinine 0.44 - 1.00 mg/dL 0.45  4.09  8.11   Sodium 135 - 145 mmol/L 139  138  140   Potassium 3.5 - 5.1 mmol/L 3.9  3.8  3.7   Chloride 98 - 111 mmol/L 107  106  106   CO2 22 - 32 mmol/L 25  26  27    Calcium 8.9 - 10.3 mg/dL 9.2  9.6  9.4   Total Protein 6.5 - 8.1 g/dL 6.6  7.3  7.1   Total Bilirubin 0.3 - 1.2 mg/dL 0.3  0.3  0.3   Alkaline Phos 38 - 126 U/L 61  57  57   AST 15 - 41 U/L 18  21  24    ALT 0 - 44 U/L 34  36  41     Lab Results  Component Value Date   WBC 3.0 (L) 03/29/2021   HGB 10.7 (L) 03/29/2021   HCT 32.2 (L) 03/29/2021   MCV 87.0 03/29/2021   PLT 305 03/29/2021   NEUTROABS 1.8 03/29/2021    ASSESSMENT & PLAN:  Malignant neoplasm of upper-outer quadrant of left breast in female, estrogen receptor positive (HCC) 09/22/2020: Palpable mass in the left breast corresponding to suspicious mass on mammogram measuring 3 cm, possible 5 mm satellite nodule, left axilla negative, right breast negative ER 40% weak, PR negative, HER2 negative, Ki-67 60%   Breast MRI 09/28/2020: 3.7 cm enhancing mass at 3 o'clock position left breast with a possible 7 mm satellite nodule, no abnormal lymph nodes.   Treatment plan: 1.  Neoadjuvant chemotherapy with dose dense Adriamycin and Cytoxan followed by Taxol weekly x12 completed 03/29/2021 2. 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  Followed by adjuvant antiestrogen therapy with ovarian function suppression and aromatase inhibitor therapy Patient had her wedding  on 10/22/2020 ------------------------------------------------------------------------   Treatment plan: Adjuvant antiestrogen therapy (ovarian function suppression with AI) No role of Verzenio. Patient to come monthly for Zoladex injections. Anastrozole started 05/17/2021.   Toxicities: Hot flashes moderate Vaginal dryness and decreased libido: I sent her a prescription for topical Estrace cream to be used twice a week Pain with injections: It appears to be better if the administration is slower and the injection has been warmed to room temperature. Bone density: Z-score -2.6: I discussed with her that we do not base the diagnosis of osteoporosis and the Z-score.  It is based T-score.  But because of her age we do not have a T-score.  It is unclear if she actually has osteoporosis.  I recommended calcium vitamin D and watchful monitoring.   Signatera testing: Negative If she wants to get pregnant  I recommended that we wait 2 years on antiestrogen therapy. Return to clinic in 6 months for follow-up and monthly for injections.    No orders of the defined types were placed in this encounter.  The patient has a good understanding of the overall plan. she agrees with it. she will call with any problems that may develop before the next visit here. Total time spent: 30 mins including face to face time and time spent for planning, charting and co-ordination of care   Tamsen Meek, MD 08/16/22    I Janan Ridge am acting as a Neurosurgeon for [employer]  I have reviewed the above documentation for accuracy and completeness, and I agree with the above.

## 2022-08-22 ENCOUNTER — Ambulatory Visit: Payer: No Typology Code available for payment source

## 2022-08-31 ENCOUNTER — Telehealth: Payer: Self-pay

## 2022-08-31 NOTE — Telephone Encounter (Signed)
Attempted to call pt regarding signatera results lvm for pt to return call back to receive results. Results is negative and signatera will be out in the next 3-6 months to repeat labs.

## 2022-09-15 ENCOUNTER — Inpatient Hospital Stay: Payer: No Typology Code available for payment source | Attending: Hematology and Oncology

## 2022-09-15 VITALS — BP 125/90 | HR 96 | Temp 98.5°F | Resp 18

## 2022-09-15 DIAGNOSIS — C50412 Malignant neoplasm of upper-outer quadrant of left female breast: Secondary | ICD-10-CM | POA: Insufficient documentation

## 2022-09-15 DIAGNOSIS — Z170 Estrogen receptor positive status [ER+]: Secondary | ICD-10-CM | POA: Insufficient documentation

## 2022-09-15 DIAGNOSIS — Z5111 Encounter for antineoplastic chemotherapy: Secondary | ICD-10-CM | POA: Insufficient documentation

## 2022-09-15 MED ORDER — GOSERELIN ACETATE 3.6 MG ~~LOC~~ IMPL
3.6000 mg | DRUG_IMPLANT | Freq: Once | SUBCUTANEOUS | Status: AC
  Administered 2022-09-15: 3.6 mg via SUBCUTANEOUS

## 2022-10-11 ENCOUNTER — Inpatient Hospital Stay: Payer: No Typology Code available for payment source | Attending: Hematology and Oncology

## 2022-10-11 VITALS — BP 127/69 | HR 86 | Temp 98.6°F | Resp 17

## 2022-10-11 DIAGNOSIS — C50412 Malignant neoplasm of upper-outer quadrant of left female breast: Secondary | ICD-10-CM | POA: Diagnosis present

## 2022-10-11 DIAGNOSIS — Z170 Estrogen receptor positive status [ER+]: Secondary | ICD-10-CM | POA: Insufficient documentation

## 2022-10-11 DIAGNOSIS — Z5111 Encounter for antineoplastic chemotherapy: Secondary | ICD-10-CM | POA: Insufficient documentation

## 2022-10-11 MED ORDER — GOSERELIN ACETATE 3.6 MG ~~LOC~~ IMPL
3.6000 mg | DRUG_IMPLANT | Freq: Once | SUBCUTANEOUS | Status: AC
  Administered 2022-10-11: 3.6 mg via SUBCUTANEOUS
  Filled 2022-10-11: qty 3.6

## 2022-11-15 ENCOUNTER — Inpatient Hospital Stay: Payer: No Typology Code available for payment source | Attending: Hematology and Oncology

## 2022-11-15 VITALS — BP 126/78 | HR 83 | Temp 98.1°F | Resp 18

## 2022-11-15 DIAGNOSIS — C50412 Malignant neoplasm of upper-outer quadrant of left female breast: Secondary | ICD-10-CM | POA: Insufficient documentation

## 2022-11-15 DIAGNOSIS — Z170 Estrogen receptor positive status [ER+]: Secondary | ICD-10-CM | POA: Diagnosis not present

## 2022-11-15 DIAGNOSIS — Z5111 Encounter for antineoplastic chemotherapy: Secondary | ICD-10-CM | POA: Diagnosis present

## 2022-11-15 LAB — SIGNATERA
SIGNATERA MTM READOUT: 0 MTM/ml
SIGNATERA TEST RESULT: NEGATIVE

## 2022-11-15 MED ORDER — GOSERELIN ACETATE 3.6 MG ~~LOC~~ IMPL
3.6000 mg | DRUG_IMPLANT | Freq: Once | SUBCUTANEOUS | Status: AC
  Administered 2022-11-15: 3.6 mg via SUBCUTANEOUS
  Filled 2022-11-15: qty 3.6

## 2022-11-16 ENCOUNTER — Ambulatory Visit (INDEPENDENT_AMBULATORY_CARE_PROVIDER_SITE_OTHER): Payer: No Typology Code available for payment source | Admitting: Radiology

## 2022-11-16 ENCOUNTER — Encounter: Payer: Self-pay | Admitting: Radiology

## 2022-11-16 VITALS — BP 128/82 | Ht 63.5 in | Wt 230.0 lb

## 2022-11-16 DIAGNOSIS — Z01419 Encounter for gynecological examination (general) (routine) without abnormal findings: Secondary | ICD-10-CM

## 2022-11-16 DIAGNOSIS — Z170 Estrogen receptor positive status [ER+]: Secondary | ICD-10-CM

## 2022-11-16 DIAGNOSIS — C50412 Malignant neoplasm of upper-outer quadrant of left female breast: Secondary | ICD-10-CM

## 2022-11-16 DIAGNOSIS — N898 Other specified noninflammatory disorders of vagina: Secondary | ICD-10-CM

## 2022-11-16 DIAGNOSIS — N912 Amenorrhea, unspecified: Secondary | ICD-10-CM | POA: Diagnosis not present

## 2022-11-16 MED ORDER — ESTRADIOL 0.1 MG/GM VA CREA: 1.0000 g | TOPICAL_CREAM | VAGINAL | 6 refills | Status: AC

## 2022-11-16 NOTE — Patient Instructions (Signed)

## 2022-11-16 NOTE — Progress Notes (Signed)
Allison Dodson December 19, 1995 [id number]   History:  27 y.o. G0 presents for annual exam. Diagnosed with estrogen receptor positive left breast cancer 09/2020. Started chemo 10/22. Her double mastectomy without reconstruction was 04/20/21.  Currently on Zoladex monthly and daily anastrazole. Interested in getting pregnant next year after she takes a break from anastrazole. Amenorrheic currently. Reports a heavy feeling in her RLQ and LLQ monthly, typically after her Zoladex injection. Reports vaginal dryness and difficulty achieving orgasm. She needs a refill on estrace cream. Using coconut oil as a lubricant. Has noticed some cognitive changes, processing/recall since treatment.  Gynecologic History No LMP recorded. (Menstrual status: Chemotherapy).   Contraception/Family planning: condoms Sexually active: yes Last Pap: age 16. Results were: normal Last mammogram: 2022. Results were: abnormal  Obstetric History OB History  Gravida Para Term Preterm AB Living  0 0 0 0 0 0  SAB IAB Ectopic Multiple Live Births  0 0 0 0 0     The following portions of the patient's history were reviewed and updated as appropriate: allergies, current medications, past family history, past medical history, past social history, past surgical history, and problem list.  Review of Systems Pertinent items noted in HPI and remainder of comprehensive ROS otherwise negative.   Past medical history, past surgical history, family history and social history were all reviewed and documented in the EPIC chart.   Exam:  Vitals:   11/16/22 1146  BP: 128/82  Weight: 230 lb (104.3 kg)  Height: 5' 3.5" (1.613 m)   Body mass index is 40.1 kg/m.  General appearance:  Normal Thyroid:  Symmetrical, normal in size, without palpable masses or nodularity. Respiratory  Auscultation:  Clear without wheezing or rhonchi Cardiovascular  Auscultation:  Regular rate, without rubs, murmurs or gallops  Edema/varicosities:  Not  grossly evident Abdominal  Soft,nontender, without masses, guarding or rebound.  Liver/spleen:  No organomegaly noted  Hernia:  None appreciated  Skin  Inspection:  Grossly normal Breasts: Bilateral mastectomy, no adnexal masses, incisions healed well. Genitourinary   Inguinal/mons:  Normal without inguinal adenopathy  External genitalia:  Normal appearing vulva with no masses, tenderness, or lesions  BUS/Urethra/Skene's glands:  Normal without masses or exudate  Vagina:  Moderately atrophic, otherwise normal appearing with normal color and discharge, no lesions  Cervix:  Normal appearing without discharge or lesions  Uterus:  Normal in size, shape and contour.  Mobile, nontender  Adnexa/parametria:     Rt: Normal in size, without masses or tenderness.   Lt: Normal in size, without masses or tenderness.  Anus and perineum: Normal   Patient informed chaperone available to be present for breast and pelvic exam. Patient has requested no chaperone to be present. Patient has been advised what will be completed during breast and pelvic exam.   Assessment/Plan:   1. Well woman exam with routine gynecological exam Pap 2026  2. Vaginal dryness Uber lube samples given Will consider Dream cream (aminophylline/arginine/sildenafil) will check with oncology to r/o contraindications - estradiol (ESTRACE VAGINAL) 0.1 MG/GM vaginal cream; Place 1 g vaginally 3 (three) times a week.  Dispense: 42.5 g; Refill: 6  3. Malignant neoplasm of upper-outer quadrant of left breast in female, estrogen receptor positive (HCC)  4. Amenorrhea Interested in trying for pregnancy next year when she takes a break from anastrazole. Currently feels a fullness in her ovaries after Zoladex injection. - US Transvaginal Non-OB; Future   May try Neuro- Mag up to 3 times daily to help with cognition  Pap  screening as directed/appropriate. Recommend of exercise weekly, including weight bearing exercise.  Encouraged the use of seatbelts and sunscreen. Return in 1 year for annual or as needed.   [name] B WHNP-BC 12:18 PM 11/16/2022

## 2022-11-24 ENCOUNTER — Other Ambulatory Visit (INDEPENDENT_AMBULATORY_CARE_PROVIDER_SITE_OTHER): Payer: No Typology Code available for payment source

## 2022-11-24 ENCOUNTER — Ambulatory Visit (INDEPENDENT_AMBULATORY_CARE_PROVIDER_SITE_OTHER): Payer: No Typology Code available for payment source | Admitting: Radiology

## 2022-11-24 ENCOUNTER — Other Ambulatory Visit: Payer: Self-pay | Admitting: Radiology

## 2022-11-24 VITALS — BP 124/78

## 2022-11-24 DIAGNOSIS — N912 Amenorrhea, unspecified: Secondary | ICD-10-CM

## 2022-11-24 DIAGNOSIS — C50412 Malignant neoplasm of upper-outer quadrant of left female breast: Secondary | ICD-10-CM

## 2022-11-24 DIAGNOSIS — Z170 Estrogen receptor positive status [ER+]: Secondary | ICD-10-CM

## 2022-11-24 NOTE — Progress Notes (Signed)
   Allison Dodson 06/23/1995 [id number]   History:  27 y.o. G0 presents for follow up ultrasound to assess discomfort in her ovaries and her uterus since she is amenorrheic.  Diagnosed with estrogen receptor positive left breast cancer 09/2020. Started chemo 10/22. Her double mastectomy without reconstruction was 04/20/21.  Currently on Zoladex monthly and daily anastrazole. Interested in getting pregnant next year after she takes a break from anastrazole. Amenorrheic currently. Reports a heavy feeling in her RLQ and LLQ monthly, typically after her Zoladex injection.   Gynecologic History No LMP recorded. (Menstrual status: Chemotherapy).   Contraception/Family planning: condoms Sexually active: yes Last Pap: age 20. Results were: normal Last mammogram: 2022. Results were: abnormal  Obstetric History OB History  Gravida Para Term Preterm AB Living  0 0 0 0 0 0  SAB IAB Ectopic Multiple Live Births  0 0 0 0 0     The following portions of the patient's history were reviewed and updated as appropriate: allergies, current medications, past family history, past medical history, past social history, past surgical history, and problem list.  Review of Systems Pertinent items noted in HPI and remainder of comprehensive ROS otherwise negative.   Past medical history, past surgical history, family history and social history were all reviewed and documented in the EPIC chart.   Exam:  Vitals:   11/24/22 1106  BP: 124/78   There is no height or weight on file to calculate BMI.  Narrative & Impression Indication: chemotherapy for breast cancer, amenorrhea, pelvic pain   Antevereted uterus, atrophic 3.25x2.55cm No myometrial masses Endometrial thickness 2.50mm   Right ovary 32x29mm simple avascular cyst   Left ovary only seen transabdominally, WNL   No adnexal masses Trace free fluid seen adjacent to right ovary   Impression: atrophic uterus, normal ovaries with right simple  cyst         Exam Ended: 11/24/22 11:01 Last Resulted: 11/24/22 11:38      Assessment/Plan:   1. Amenorrhea reassured  2. Malignant neoplasm of upper-outer quadrant of left breast in female, estrogen receptor positive (HCC) Plans to take a medication holiday Mid May 2025, will consult with oncology regarding next steps and consider consult with [facility] to further discuss chances of pregnancy   [name] B WHNP-BC 11:30 AM 11/24/2022

## 2022-12-01 ENCOUNTER — Telehealth: Payer: Self-pay | Admitting: Hematology and Oncology

## 2022-12-01 NOTE — Telephone Encounter (Signed)
Per secure chat with Shawna Orleans on 12/01/2022 left patient a message regarding rescheduled appointment times due to the flush room having a meeting that day; left callback if needing to rescheduled

## 2022-12-13 ENCOUNTER — Inpatient Hospital Stay: Payer: No Typology Code available for payment source | Attending: Hematology and Oncology

## 2022-12-13 VITALS — BP 131/82 | HR 81 | Temp 98.0°F | Resp 18

## 2022-12-13 DIAGNOSIS — Z170 Estrogen receptor positive status [ER+]: Secondary | ICD-10-CM | POA: Insufficient documentation

## 2022-12-13 DIAGNOSIS — Z5111 Encounter for antineoplastic chemotherapy: Secondary | ICD-10-CM | POA: Insufficient documentation

## 2022-12-13 DIAGNOSIS — C50412 Malignant neoplasm of upper-outer quadrant of left female breast: Secondary | ICD-10-CM | POA: Diagnosis present

## 2022-12-13 MED ORDER — GOSERELIN ACETATE 3.6 MG ~~LOC~~ IMPL
3.6000 mg | DRUG_IMPLANT | Freq: Once | SUBCUTANEOUS | Status: AC
  Administered 2022-12-13: 3.6 mg via SUBCUTANEOUS
  Filled 2022-12-13: qty 3.6

## 2023-01-09 ENCOUNTER — Inpatient Hospital Stay: Payer: No Typology Code available for payment source | Attending: Hematology and Oncology

## 2023-01-09 VITALS — BP 142/70 | HR 92 | Temp 98.4°F | Resp 20

## 2023-01-09 DIAGNOSIS — Z170 Estrogen receptor positive status [ER+]: Secondary | ICD-10-CM | POA: Insufficient documentation

## 2023-01-09 DIAGNOSIS — Z5111 Encounter for antineoplastic chemotherapy: Secondary | ICD-10-CM | POA: Diagnosis present

## 2023-01-09 DIAGNOSIS — C50412 Malignant neoplasm of upper-outer quadrant of left female breast: Secondary | ICD-10-CM | POA: Diagnosis present

## 2023-01-09 MED ORDER — GOSERELIN ACETATE 3.6 MG ~~LOC~~ IMPL
3.6000 mg | DRUG_IMPLANT | Freq: Once | SUBCUTANEOUS | Status: AC
  Administered 2023-01-09: 3.6 mg via SUBCUTANEOUS
  Filled 2023-01-09: qty 3.6

## 2023-01-20 ENCOUNTER — Other Ambulatory Visit: Payer: Self-pay | Admitting: Hematology and Oncology

## 2023-02-14 ENCOUNTER — Inpatient Hospital Stay: Payer: No Typology Code available for payment source

## 2023-02-14 ENCOUNTER — Inpatient Hospital Stay
Payer: No Typology Code available for payment source | Attending: Hematology and Oncology | Admitting: Hematology and Oncology

## 2023-02-14 ENCOUNTER — Encounter: Payer: Self-pay | Admitting: *Deleted

## 2023-02-14 VITALS — BP 145/84 | HR 102 | Temp 98.9°F | Resp 18 | Ht 63.5 in | Wt 237.6 lb

## 2023-02-14 DIAGNOSIS — Z9011 Acquired absence of right breast and nipple: Secondary | ICD-10-CM | POA: Diagnosis not present

## 2023-02-14 DIAGNOSIS — Z1732 Human epidermal growth factor receptor 2 negative status: Secondary | ICD-10-CM | POA: Diagnosis not present

## 2023-02-14 DIAGNOSIS — Z170 Estrogen receptor positive status [ER+]: Secondary | ICD-10-CM | POA: Insufficient documentation

## 2023-02-14 DIAGNOSIS — C50412 Malignant neoplasm of upper-outer quadrant of left female breast: Secondary | ICD-10-CM | POA: Diagnosis present

## 2023-02-14 DIAGNOSIS — Z1722 Progesterone receptor negative status: Secondary | ICD-10-CM | POA: Insufficient documentation

## 2023-02-14 DIAGNOSIS — Z79899 Other long term (current) drug therapy: Secondary | ICD-10-CM | POA: Insufficient documentation

## 2023-02-14 NOTE — Progress Notes (Signed)
Patient Care Team: Linus Galas, NP as PCP - General  DIAGNOSIS:  Encounter Diagnosis  Name Primary?   Malignant neoplasm of upper-outer quadrant of left breast in female, estrogen receptor positive (HCC) Yes    SUMMARY OF ONCOLOGIC HISTORY: Oncology History  Malignant neoplasm of upper-outer quadrant of left breast in female, estrogen receptor positive (HCC)  09/25/2020 Initial Diagnosis   Palpable lump in the left breast.  Mammogram revealed 3 cm tumor and a possible 5 mm satellite nodule.  Biopsy revealed grade 3 IDC ER 40% weak, PR 0%, HER2 negative, Ki-67 60%   09/25/2020 Cancer Staging   Staging form: Breast, AJCC 8th Edition - Clinical stage from 09/25/2020: Stage IIB (cT2, cN0, cM0, G3, ER+, PR-, HER2-) - Signed by Serena Croissant, MD on 09/25/2020 Stage prefix: Initial diagnosis Histologic grading system: 3 grade system   10/02/2020 Genetic Testing   Negative hereditary cancer genetic testing: no pathogenic variants detected in [facility] BRCAPlus Panel and [facility] CancerNext-Expanded +RNAinsight Panel.  The report dates are October 02, 2020 and October 13, 2020, respectively.   The BRCAplus panel offered by [facility] [facility] and includes sequencing and deletion/duplication analysis for the following 8 genes: ATM, BRCA1, BRCA2, CDH1, CHEK2, PALB2, PTEN, and TP53.  The CancerNext-Expanded gene panel offered by [facility] and includes sequencing, rearrangement, and RNA analysis for the following 77 genes: AIP, ALK, APC, ATM, AXIN2, BAP1, BARD1, BLM, BMPR1A, BRCA1, BRCA2, BRIP1, CDC73, CDH1, CDK4, CDKN1B, CDKN2A, CHEK2, CTNNA1, DICER1, FANCC, FH, FLCN, GALNT12, KIF1B, LZTR1, MAX, MEN1, MET, MLH1, MSH2, MSH3, MSH6, MUTYH, NBN, NF1, NF2, NTHL1, PALB2, PHOX2B, PMS2, POT1, PRKAR1A, PTCH1, PTEN, RAD51C, RAD51D, RB1, RECQL, RET, SDHA, SDHAF2, SDHB, SDHC, SDHD, SMAD4, SMARCA4, SMARCB1, SMARCE1, STK11, SUFU, TMEM127, TP53, TSC1, TSC2, VHL and XRCC2 (sequencing and deletion/duplication); EGFR, EGLN1,  HOXB13, KIT, MITF, PDGFRA, POLD1, and POLE (sequencing only); EPCAM and GREM1 (deletion/duplication only).    11/03/2020 -  Chemotherapy   Patient is on Treatment Plan : BREAST ADJUVANT DOSE DENSE AC q14d / PACLitaxel switched to Abraxane (due to rxn) q7d      04/20/2021 Surgery   Right mastectomy: Negative for cancer Left mastectomy: High-grade 1.4 cm IDC with clear margins, 0/7 lymph nodes negative Biomarkers on the initial biopsy: ER 40% weak, PR 0%, HER2 1+ negative, Ki-67 60%    05/17/2021 -  Anti-estrogen oral therapy   Anastrozole, Zoladex every 4 weeks     CHIEF COMPLIANT: Follow-up to discuss antiestrogen treatment plan  HISTORY OF PRESENT ILLNESS:   History of Present Illness   The patient is a 28 year old female with breast cancer who presents with concerns about medication side effects and family planning. She is accompanied by her partner.  She has been experiencing significant side effects from her current medication regimen, which includes injections and anastrozole. These side effects include hot flashes, fatigue, brain fog, and vaginal dryness, which have been persistent since starting the treatment in May 2023.  The brain fog is particularly challenging, impacting her ability to perform her job effectively. She often finds herself unable to recall information or complete tasks as easily as she used to. Fatigue also contributes to her overall sense of being unwell.  Regarding her reproductive health, she visited her gynecologist two months ago, where a transvaginal ultrasound revealed that her ovaries and uterus appeared small, consistent with her current medication's effects. She is considering family planning and is open to trying to conceive naturally, without pursuing fertility treatments like IVF. She has been on her current  treatment regimen for nearly two years and is contemplating taking a break to allow her cycles to return, with the hope of improving her quality of  life and exploring the possibility of having a child.  No mood swings, but she attributes any changes to her pre-existing anxiety.         ALLERGIES:  is allergic to paclitaxel.  MEDICATIONS:  Current Outpatient Medications  Medication Sig Dispense Refill   busPIRone (BUSPAR) 5 MG tablet 1 tablet Orally Twice a day for 90 days     Cholecalciferol (VITAMIN D-3 PO) Take by mouth.     estradiol (ESTRACE VAGINAL) 0.1 MG/GM vaginal cream Place 1 g vaginally 3 (three) times a week. 42.5 g 6   Menatetrenone (VITAMIN K2) 100 MCG TABS 2 tabs Orally twice a day     Oyster Shell Calcium 500 MG TABS 1 tablet with a meal Orally Twice a day     No current facility-administered medications for this visit.    PHYSICAL EXAMINATION: ECOG PERFORMANCE STATUS: 1 - Symptomatic but completely ambulatory  Vitals:   02/14/23 1418  BP: (!) 145/84  Pulse: (!) 102  Resp: 18  Temp: 98.9 F (37.2 C)  SpO2: 100%   Filed Weights   02/14/23 1418  Weight: 237 lb 9.6 oz (107.8 kg)    Physical Exam          (exam performed in the presence of a chaperone)  LABORATORY DATA:  I have reviewed the data as listed    Latest Ref Rng & Units 03/29/2021    8:33 AM 03/23/2021    9:58 AM 03/16/2021    9:28 AM  CMP  Glucose 70 - 99 mg/dL 161  99  096   BUN 6 - 20 mg/dL 11  10  9    Creatinine 0.44 - 1.00 mg/dL 0.45  4.09  8.11   Sodium 135 - 145 mmol/L 139  138  140   Potassium 3.5 - 5.1 mmol/L 3.9  3.8  3.7   Chloride 98 - 111 mmol/L 107  106  106   CO2 22 - 32 mmol/L 25  26  27    Calcium 8.9 - 10.3 mg/dL 9.2  9.6  9.4   Total Protein 6.5 - 8.1 g/dL 6.6  7.3  7.1   Total Bilirubin 0.3 - 1.2 mg/dL 0.3  0.3  0.3   Alkaline Phos 38 - 126 U/L 61  57  57   AST 15 - 41 U/L 18  21  24    ALT 0 - 44 U/L 34  36  41     Lab Results  Component Value Date   WBC 3.0 (L) 03/29/2021   HGB 10.7 (L) 03/29/2021   HCT 32.2 (L) 03/29/2021   MCV 87.0 03/29/2021   PLT 305 03/29/2021   NEUTROABS 1.8 03/29/2021     ASSESSMENT & PLAN:  Malignant neoplasm of upper-outer quadrant of left breast in female, estrogen receptor positive (HCC) 09/22/2020: Palpable mass in the left breast corresponding to suspicious mass on mammogram measuring 3 cm, possible 5 mm satellite nodule, left axilla negative, right breast negative ER 40% weak, PR negative, HER2 negative, Ki-67 60%   Breast MRI 09/28/2020: 3.7 cm enhancing mass at 3 o'clock position left breast with a possible 7 mm satellite nodule, no abnormal lymph nodes.   Treatment plan: 1.  Neoadjuvant chemotherapy with dose dense Adriamycin and Cytoxan followed by Taxol weekly x12 completed 03/29/2021 2. 04/20/2021:Right mastectomy: Negative for cancer Left mastectomy: High-grade  res IDC 1.4 cm with clear margins, 0/7 lymph nodes negative Biomarkers on the initial biopsy: ER 40% weak, PR 0%, HER2 1+ negative, Ki-67 60% 3.  Followed by adjuvant antiestrogen therapy with ovarian function suppression and aromatase inhibitor therapy started 05/17/2021 Patient had her wedding on 10/22/2020 ------------------------------------------------------------------------   Treatment plan: Adjuvant antiestrogen therapy (ovarian function suppression with AI) No role of Verzenio. Patient to come monthly for Zoladex injections. Anastrozole started 05/17/2021.   Toxicities: Hot flashes moderate Vaginal dryness and decreased libido: Progressively getting worse Pain with injections: It appears to be better if the administration is slower and the injection has been warmed to room temperature. Bone density: Z-score -2.6: I discussed with her that we do not base the diagnosis of osteoporosis and the Z-score.  It is based T-score.  But because of her age we do not have a T-score.  It is unclear if she actually has osteoporosis.  I recommended calcium vitamin D and watchful monitoring.   Signatera testing: Negative   Patient would to get pregnant and therefore we will discontinue further  Zoladex injections.  She will also stop anastrozole therapy.  We had a long conversation about completely discontinuing antiestrogen therapy versus resuming it after delivery.  We can discuss less intense options after delivery like tamoxifen.   No orders of the defined types were placed in this encounter.  The patient has a good understanding of the overall plan. she agrees with it. she will call with any problems that may develop before the next visit here. Total time spent: 30 mins including face to face time and time spent for planning, charting and co-ordination of care   Tamsen Meek, MD 02/14/23

## 2023-02-14 NOTE — Assessment & Plan Note (Signed)
09/22/2020: Palpable mass in the left breast corresponding to suspicious mass on mammogram measuring 3 cm, possible 5 mm satellite nodule, left axilla negative, right breast negative ER 40% weak, PR negative, HER2 negative, Ki-67 60%   Breast MRI 09/28/2020: 3.7 cm enhancing mass at 3 o'clock position left breast with a possible 7 mm satellite nodule, no abnormal lymph nodes.   Treatment plan: 1.  Neoadjuvant chemotherapy with dose dense Adriamycin and Cytoxan followed by Taxol weekly x12 completed 03/29/2021 2. 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  Followed by adjuvant antiestrogen therapy with ovarian function suppression and aromatase inhibitor therapy Patient had her wedding on 10/22/2020 ------------------------------------------------------------------------   Treatment plan: Adjuvant antiestrogen therapy (ovarian function suppression with AI) No role of Verzenio. Patient to come monthly for Zoladex injections. Anastrozole started 05/17/2021.   Toxicities: Hot flashes moderate Vaginal dryness and decreased libido: I sent her a prescription for topical Estrace cream to be used twice a week Pain with injections: It appears to be better if the administration is slower and the injection has been warmed to room temperature. Bone density: Z-score -2.6: I discussed with her that we do not base the diagnosis of osteoporosis and the Z-score.  It is based T-score.  But because of her age we do not have a T-score.  It is unclear if she actually has osteoporosis.  I recommended calcium vitamin D and watchful monitoring.   Signatera testing: Negative If she wants to get pregnant I recommended that we wait 2 years on antiestrogen therapy. Return to clinic in 6 months for follow-up and monthly for injections.

## 2023-02-14 NOTE — Research (Cosign Needed)
Trial: NRG-CC011: COGNITIVE TRAINING FOR CANCER RELATED COGNITIVE IMPAIRMENT IN BREAST CANCER SURVIVORS: A MULTI-CENTER RANDOMIZED DOUBLE- BLINDED CONTROLLED TRIAL    Patient Allison Dodson was identified by Dr. Pamelia Hoit as a potential candidate for the above listed study.  This Clinical Research Nurse met with Allison Dodson, [id number], on 02/14/23 in a manner and location that ensures patient privacy to discuss participation in the above listed research study.  Patient is Accompanied by her husband .  Initial eligibility was completed by review of medical record and patient interview. Patient confirmed the following eligibility criteria: Patient reads, writes, and understands [place]. Patient does not have a history of other cancers. Patient has no history of exposure to chemotherapeutics for another cancer or due to other medical condition. Patient has no history of CNS radiation, intrathecal therapy or CNS-involved surgery. Patient has no history of Stroke, TBI, brain surgery, Alzheimer's disease, or other dementia. Patient has no active substance abuse and is not in active treatment for substance abuse. Patient has no history of bipolar disorder, psychosis, schizophrenia, ADHD, or learning disability. Patient is not enrolled in other research trials.   Patient consented verbally to continue to screening, which was conducted using the NRG-CC011 Screening Script, Protocol Version Date 12/16/2021. Patient scored 8 on Assessment #1. Patient scored 6 on Assessment #2. Patient scored 0 on Assessment #3. Based on this screening, patient is eligible to proceed with consent and enrollment discussions. A copy of the informed consent document and separate HIPAA Authorization was provided to the patient.      Patient was provided with the business card of this Nurse and encouraged to contact the research team with any questions.  Patient was provided the option of taking informed consent documents home to  review and was encouraged to review at their convenience with their support network, including other care providers. Patient is comfortable with making a decision regarding study participation today.  As outlined in the informed consent form, this Nurse and Sueanne Margarita discussed the purpose of the research study, the investigational nature of the study, study procedures and requirements for study participation, potential risks and benefits of study participation, as well as alternatives to participation. This study is blinded. The patient understands participation is voluntary and they may withdraw from study participation at any time.  Each study arm was reviewed, and randomization discussed.  This study does not involve an investigational drug or device. This study does not involve a placebo. Patient understands enrollment is pending full eligibility review.   Confidentiality and how the patient's information will be used as part of study participation were discussed.  Patient was informed there is reimbursement provided for their time and effort spent on trial participation.  The patient is encouraged to discuss research study participation with their insurance provider to determine what costs they may incur as part of study participation, including research related injury.    All questions were answered to patient's satisfaction.  The informed consent and separate HIPAA Authorization was reviewed page by page.  The patient's mental and emotional status is appropriate to provide informed consent, and the patient verbalizes an understanding of study participation.  Patient has agreed to participate in the above listed research study and has voluntarily signed the informed consent protocol version date 12/16/2021 and separate HIPAA Authorization, version [place] [facility] approved 10/17/2022  on 02/14/23 at 3:35PM.  The patient was provided with a copy of the signed informed consent form and separate HIPAA  Authorization for their reference.  No  study specific procedures were obtained prior to the signing of the informed consent document.  Approximately 30 minutes were spent with the patient reviewing the informed consent documents.  Patient was not requested to complete a Release of Information form.   After consent, patient confirmed demographic and contact information.  Patient also asked how the computerized training is broken up into time segments. She wants to know if she will be able to complete shorter segments, i.e. 10-30 minutes at a time versus having to commit to at least one hour at a time. Can one complete 10 minutes and then save to continue later for example? Informed patient that we will reach out to the study to answer this question and will not enroll patient until we can provide her the answer. Patient agreed. Plan to contact patient again with answer to above question and then enroll her on the study if she still agrees at that time.   Domenica Reamer, BSN, RN, [employer] Research Nurse II [phone] 02/14/2023

## 2023-02-17 ENCOUNTER — Telehealth: Payer: Self-pay | Admitting: *Deleted

## 2023-02-17 NOTE — Telephone Encounter (Signed)
NRG-CC011: COGNITIVE TRAINING FOR CANCER RELATED COGNITIVE IMPAIRMENT IN BREAST CANCER SURVIVORS: A MULTI-CENTER RANDOMIZED DOUBLE- BLINDED CONTROLLED TRIAL   Called pt to answer a question she had about the above study. Informed patient the computerized training for this study can be broken up into small amounts of time as long as it is done on average 4 hrs per week altogether.  Asked patient to call back and let research nurse know if she can register patient to the first part of the study or if she has any other questions.  Domenica Reamer, BSN, RN, [employer] Research Nurse II [phone] 02/17/2023 1:34 PM

## 2023-02-21 ENCOUNTER — Telehealth: Payer: Self-pay | Admitting: *Deleted

## 2023-02-21 ENCOUNTER — Other Ambulatory Visit: Payer: Self-pay | Admitting: *Deleted

## 2023-02-21 DIAGNOSIS — Z170 Estrogen receptor positive status [ER+]: Secondary | ICD-10-CM

## 2023-02-21 NOTE — Progress Notes (Signed)
Renewal orders placed for Signatera testing.

## 2023-02-21 NOTE — Telephone Encounter (Signed)
 NRG-CC011: COGNITIVE TRAINING FOR CANCER RELATED COGNITIVE IMPAIRMENT IN BREAST CANCER SURVIVORS: A MULTI-CENTER RANDOMIZED DOUBLE- BLINDED CONTROLLED TRIAL   Left VM for patient to follow up on a question she wanted answered before enrolling. Informed patient the computerized training for this study can be broken up into small amounts of time as long as it is done on average 4 hrs per week altogether.  Asked patient to call back and let research nurse know if she can register patient to the first part of the study or if she has any other questions.  Cherylyn Hoard, BSN, RN, [employer] Research Nurse II [phone] 02/21/2023 12:14 PM

## 2023-02-23 ENCOUNTER — Telehealth: Payer: Self-pay | Admitting: *Deleted

## 2023-02-23 NOTE — Telephone Encounter (Signed)
 NRG-CC011: COGNITIVE TRAINING FOR CANCER RELATED COGNITIVE IMPAIRMENT IN BREAST CANCER SURVIVORS: A MULTI-CENTER RANDOMIZED DOUBLE- BLINDED CONTROLLED TRIAL   Left VM for patient to follow up on a question she wanted answered before enrolling and confirm it is okay to register her to this study. Asked patient to call back and let research nurse know if she can register patient to the first part of the study or if she has any other questions. Also informed her that this research nurse will be out of the office starting tomorrow and return on 03/06/23.  I will call patient back when I return if she is not able to call back today.  Cherylyn Hoard, BSN, RN, CCRP Clinical Research Nurse II [phone] 02/23/2023 10:55 AM  Patient returned call and left VM stating she has thought about the study a little more now and has decided the time commitment of 4 hours per week is too much for her to be able to complete. Therefore she does not want to be enrolled in the study.  Dr. Odean notified.  Cherylyn Hoard, BSN, RN, [employer] Research Nurse II [phone] 02/23/2023 11:24 AM

## 2023-03-12 IMAGING — MR MR BREAST BILAT WO/W CM
8 of 12 series · 32 of 48 positions shown · IV contrast (10 ml gadavist)
Comparison: 09/27/2020

CLINICAL DATA: Undergoing chemotherapy for grade 3 invasive ductal
carcinoma in the 3 o'clock position of the left breast.

EXAM:
BILATERAL BREAST MRI WITH AND WITHOUT CONTRAST
TECHNIQUE: Multiplanar, multisequence MR images of both breasts were obtained
prior to and following the intravenous administration of 10 ml of
Gadavist

[Series 2: t2_tirm_tra ipat (a-p) · axial · 3.0mm · 0.70mm/px · 1 of 55 slices shown]
[im 1/55]
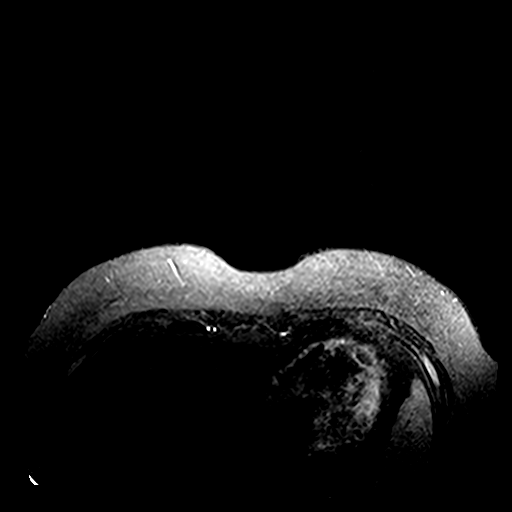

[Series 3: fl3d pre-cm no · axial · non-contrast · 1.2mm · 0.94mm/px · z∈[-70,+102]mm · 5 of 144 slices shown]
[im 1/144]
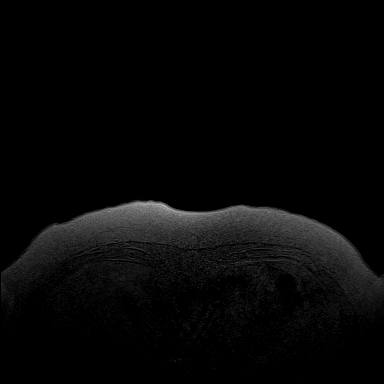
[im 36/144]
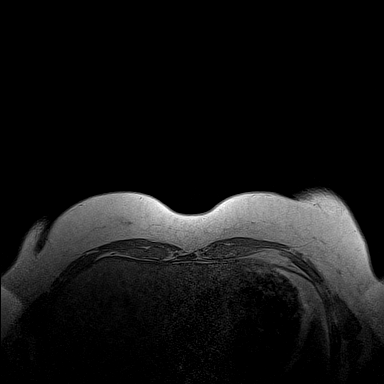
[im 72/144]
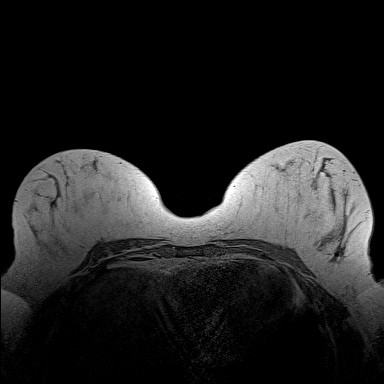
[im 108/144]
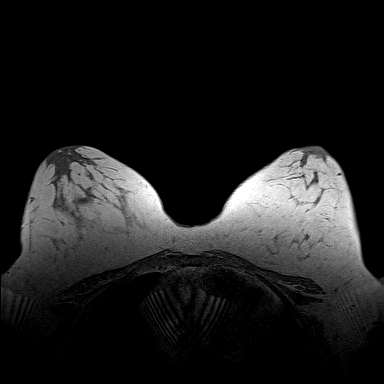
[im 144/144]
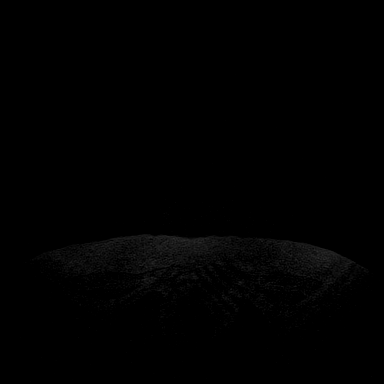

[Series 4: fl3d pre-cm · axial · non-contrast · 1.2mm · 0.94mm/px · z∈[-70,+102]mm · 5 of 139 slices shown]
[im 1/139]
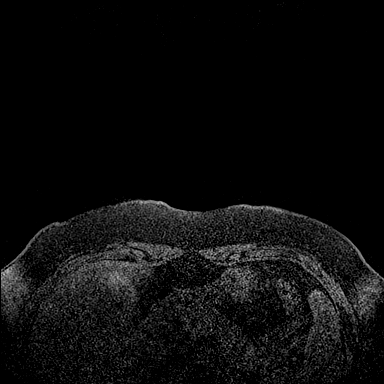
[im 35/139]
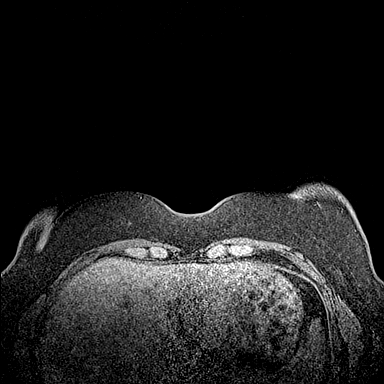
[im 70/139]
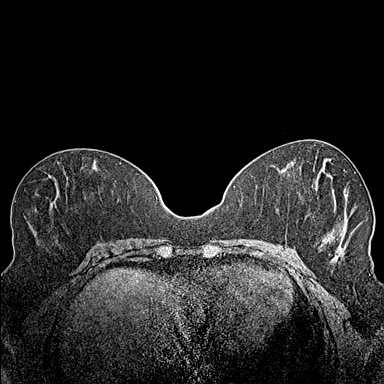
[im 104/139]
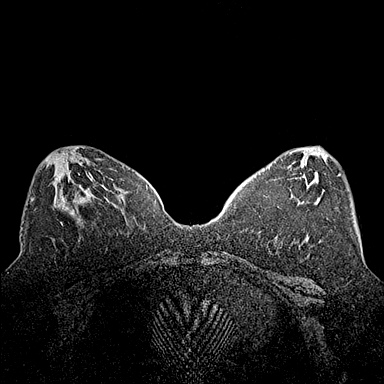
[im 139/139]
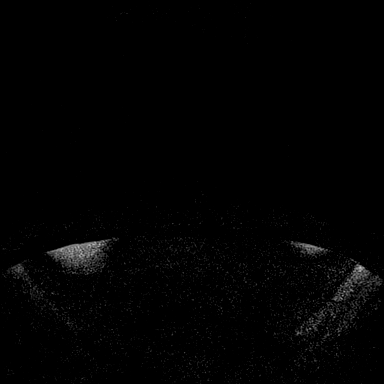

[Series 5: fl3d post-cm 20 · axial · 1.2mm · 0.94mm/px · z∈[-70,+102]mm · 5 of 144 slices shown (1 of 3)]
[im 1/144]
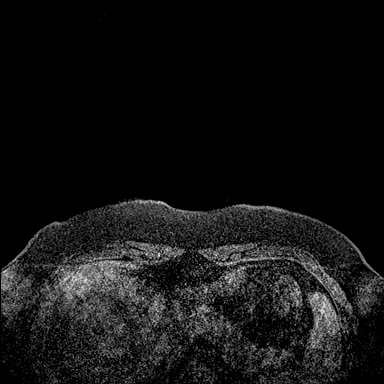
[im 36/144]
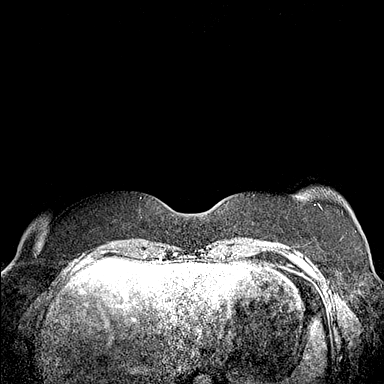
[im 72/144]
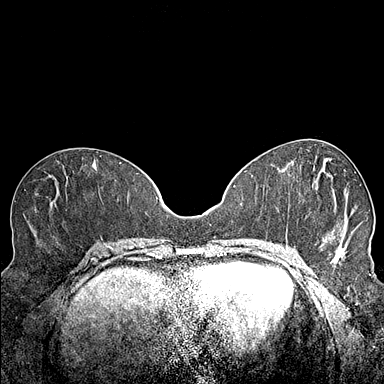
[im 108/144]
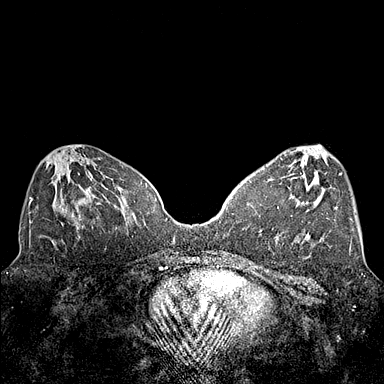
[im 144/144]
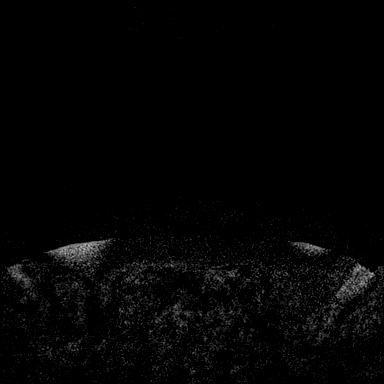

[Series 6: fl3d post-cm 20 · axial · 1.2mm · 0.94mm/px · z∈[-70,+102]mm · 5 of 144 slices shown (2 of 3)]
[im 1/144]
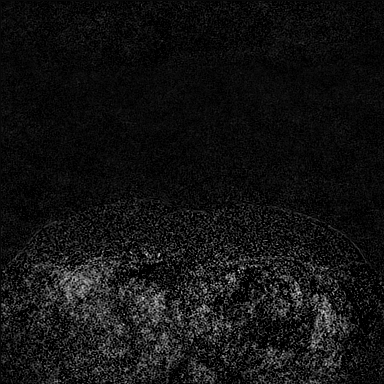
[im 36/144]
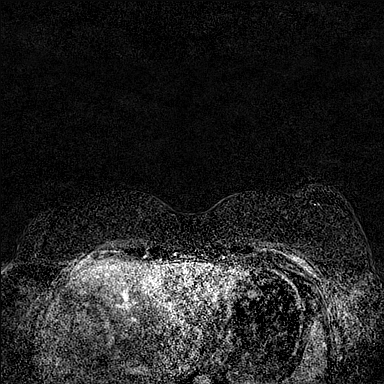
[im 72/144]
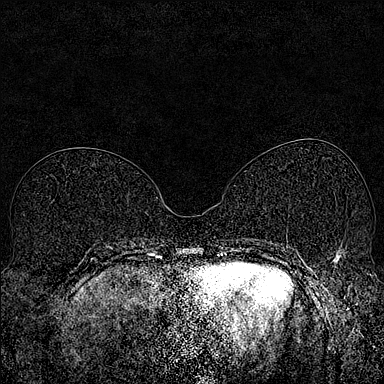
[im 108/144]
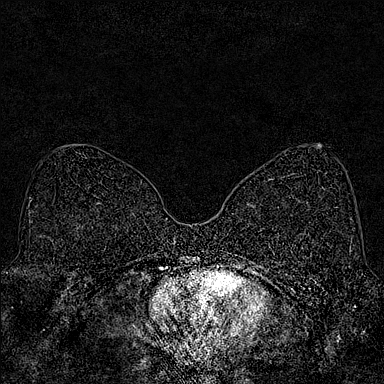
[im 144/144]
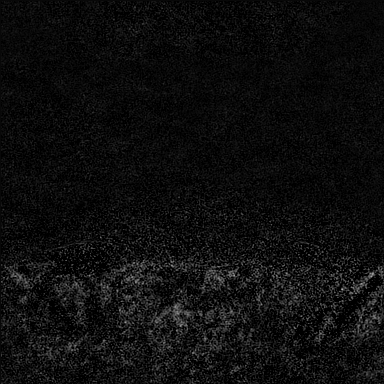

[Series 7: fl3d post-cm 20 · axial · 172.8mm · 0.94mm/px · 1 of 1 slices shown (3 of 3)]
[im 1/1]
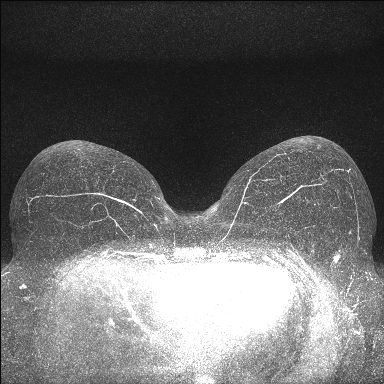

[Series 8: fl3d post-cm 3 · axial · 1.2mm · 0.94mm/px · z∈[-70,+102]mm · 6 of 144 slices shown (1 of 2)]
[im 1/144]
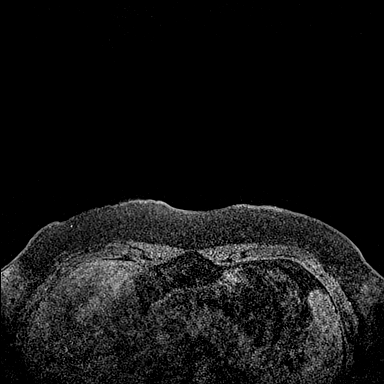
[im 29/144]
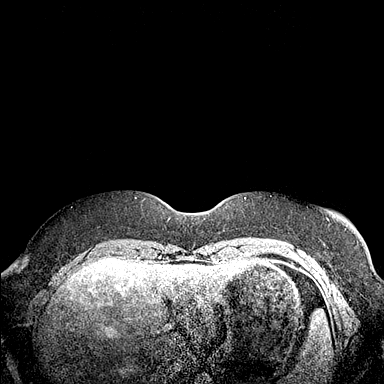
[im 58/144]
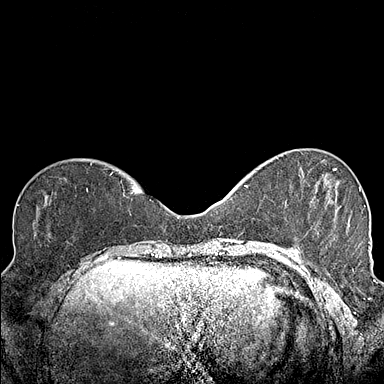
[im 86/144]
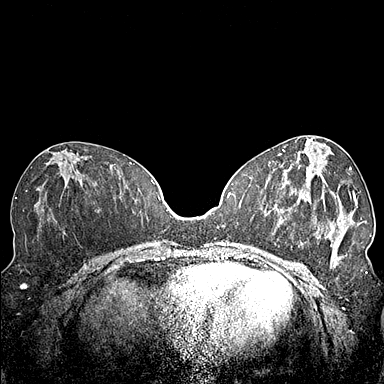
[im 115/144]
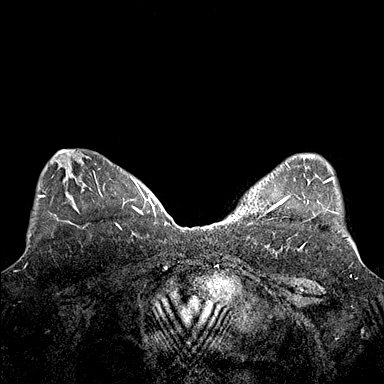
[im 144/144]
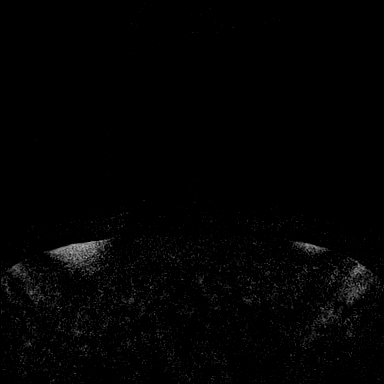

[Series 9: fl3d post-cm 3 · axial · 1.2mm · 0.94mm/px · z∈[-70,+32]mm · 4 of 144 slices shown (2 of 2)]
[im 1/144]
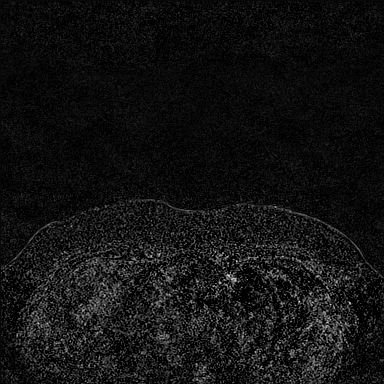
[im 29/144]
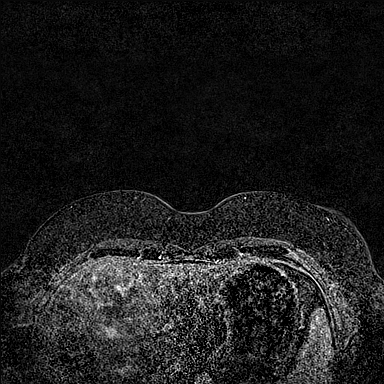
[im 58/144]
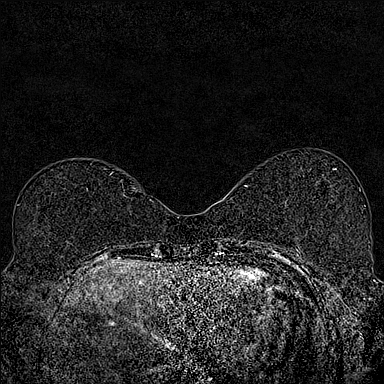
[im 86/144]
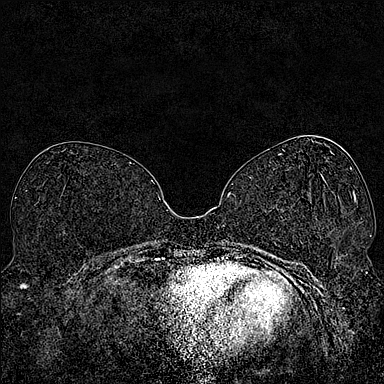

[32 of 48 positions shown; findings below may reference images not displayed]

Three-dimensional MR images were rendered by post-processing of the
original MR data on an independent workstation. The
three-dimensional MR images were interpreted, and findings are
reported in the following complete MRI report for this study. Three
dimensional images were evaluated at the independent interpreting
workstation using the [employer] thin client.
FINDINGS: Breast composition: c. Heterogeneous fibroglandular tissue.

Background parenchymal enhancement: Minimal

Right breast: No mass or abnormal enhancement.

Left breast: 1.3 x 1.1 x 0.6 cm enhancing mass containing a biopsy
marker clip artifact in the posterior outer aspect of the breast.
This has a mixture of enhancement kinetics, including a small amount
of rapid wash-in/washout. This corresponds to the previously
biopsied malignancy and measured 3.7 x 3.0 x 2.6 cm on 09/28/2020.

The previously demonstrated 7 mm nodule anterior and lateral to this
mass is also significantly smaller, measuring 5 mm in maximum
diameter today. With the interval decrease in size of the larger
mass, this is currently located 1.5 cm anterolateral to the residual
mass.

No new nodules or masses are seen.

Lymph nodes: No abnormal appearing lymph nodes.

Ancillary findings:  None.
IMPRESSION: 1. Marked decrease in size of the biopsy-proven malignancy in the
posterior 3 o'clock position of the left breast, currently measuring
1.3 x 1.1 x 0.6 cm.
2. Significant decrease in size of the previously demonstrated small
adjacent possible satellite nodule, currently measuring 5 mm in
maximum diameter. This is currently located 1.5 cm anterolateral to
the remaining portion of the malignancy.
3. No evidence of malignancy on the right.

RECOMMENDATION:
Treatment plan.

BI-RADS CATEGORY  6: Known biopsy-proven malignancy.

## 2023-05-03 LAB — SIGNATERA ONLY (NATERA MANAGED)
SIGNATERA MTM READOUT: 0 MTM/ml
SIGNATERA TEST RESULT: NEGATIVE

## 2023-06-06 ENCOUNTER — Other Ambulatory Visit: Payer: Self-pay | Admitting: Radiology

## 2023-06-06 DIAGNOSIS — N83201 Unspecified ovarian cyst, right side: Secondary | ICD-10-CM

## 2023-06-06 DIAGNOSIS — Z853 Personal history of malignant neoplasm of breast: Secondary | ICD-10-CM

## 2023-06-13 ENCOUNTER — Other Ambulatory Visit: Payer: Self-pay | Admitting: Radiology

## 2023-06-13 ENCOUNTER — Ambulatory Visit (INDEPENDENT_AMBULATORY_CARE_PROVIDER_SITE_OTHER)

## 2023-06-13 DIAGNOSIS — Z853 Personal history of malignant neoplasm of breast: Secondary | ICD-10-CM

## 2023-06-13 DIAGNOSIS — N83201 Unspecified ovarian cyst, right side: Secondary | ICD-10-CM

## 2023-06-20 ENCOUNTER — Ambulatory Visit: Payer: Self-pay | Admitting: Radiology

## 2023-11-26 LAB — SIGNATERA
SIGNATERA MTM READOUT: 0 MTM/ml
SIGNATERA TEST RESULT: NEGATIVE
